# Patient Record
Sex: Male | Born: 1960
Health system: Southern US, Community
[De-identification: ages and names within clinical notes are randomized; demographics above are authoritative.]

## PROBLEM LIST (undated history)

## (undated) DIAGNOSIS — K219 Gastro-esophageal reflux disease without esophagitis: Secondary | ICD-10-CM

## (undated) DIAGNOSIS — M549 Dorsalgia, unspecified: Secondary | ICD-10-CM

## (undated) DIAGNOSIS — I2699 Other pulmonary embolism without acute cor pulmonale: Secondary | ICD-10-CM

## (undated) DIAGNOSIS — M1990 Unspecified osteoarthritis, unspecified site: Secondary | ICD-10-CM

## (undated) DIAGNOSIS — Z8739 Personal history of other diseases of the musculoskeletal system and connective tissue: Secondary | ICD-10-CM

## (undated) DIAGNOSIS — I10 Essential (primary) hypertension: Secondary | ICD-10-CM

## (undated) DIAGNOSIS — I82409 Acute embolism and thrombosis of unspecified deep veins of unspecified lower extremity: Secondary | ICD-10-CM

## (undated) DIAGNOSIS — G8929 Other chronic pain: Secondary | ICD-10-CM

## (undated) HISTORY — PX: BACK SURGERY: SHX140

## (undated) HISTORY — DX: Acute embolism and thrombosis of unspecified deep veins of unspecified lower extremity: I82.409

## (undated) HISTORY — PX: INGUINAL HERNIA REPAIR: SUR1180

## (undated) HISTORY — DX: Other pulmonary embolism without acute cor pulmonale: I26.99

## (undated) HISTORY — PX: COLONOSCOPY: SHX174

---

## 1999-02-05 HISTORY — PX: INGUINAL HERNIA REPAIR: SUR1180

## 2010-06-06 HISTORY — PX: CARPAL TUNNEL RELEASE: SHX101

## 2014-03-18 ENCOUNTER — Encounter (HOSPITAL_COMMUNITY): Payer: Self-pay | Admitting: Pharmacy Technician

## 2014-03-24 ENCOUNTER — Ambulatory Visit (HOSPITAL_COMMUNITY)
Admission: RE | Admit: 2014-03-24 | Discharge: 2014-03-24 | Disposition: A | Discharge status: Home / Self Care | Payer: BC Managed Care – PPO | Source: Ambulatory Visit | Attending: Orthopedic Surgery | Admitting: Orthopedic Surgery

## 2014-03-24 ENCOUNTER — Encounter (HOSPITAL_COMMUNITY)
Admission: RE | Admit: 2014-03-24 | Discharge: 2014-03-24 | Disposition: A | Discharge status: Home / Self Care | Payer: BC Managed Care – PPO | Source: Ambulatory Visit | Attending: Orthopedic Surgery | Admitting: Orthopedic Surgery

## 2014-03-24 ENCOUNTER — Other Ambulatory Visit (HOSPITAL_COMMUNITY): Payer: Self-pay

## 2014-03-24 ENCOUNTER — Encounter (HOSPITAL_COMMUNITY): Payer: Self-pay

## 2014-03-24 ENCOUNTER — Ambulatory Visit (HOSPITAL_COMMUNITY): Admission: RE | Admit: 2014-03-24 | Payer: BC Managed Care – PPO | Source: Ambulatory Visit

## 2014-03-24 DIAGNOSIS — M4806 Spinal stenosis, lumbar region: Secondary | ICD-10-CM | POA: Diagnosis present

## 2014-03-24 DIAGNOSIS — I10 Essential (primary) hypertension: Secondary | ICD-10-CM | POA: Diagnosis not present

## 2014-03-24 DIAGNOSIS — M4807 Spinal stenosis, lumbosacral region: Secondary | ICD-10-CM | POA: Diagnosis not present

## 2014-03-24 DIAGNOSIS — K219 Gastro-esophageal reflux disease without esophagitis: Secondary | ICD-10-CM | POA: Diagnosis not present

## 2014-03-24 DIAGNOSIS — Z01818 Encounter for other preprocedural examination: Secondary | ICD-10-CM | POA: Diagnosis present

## 2014-03-24 DIAGNOSIS — M5136 Other intervertebral disc degeneration, lumbar region: Secondary | ICD-10-CM | POA: Diagnosis present

## 2014-03-24 DIAGNOSIS — Z87891 Personal history of nicotine dependence: Secondary | ICD-10-CM | POA: Diagnosis not present

## 2014-03-24 HISTORY — DX: Gastro-esophageal reflux disease without esophagitis: K21.9

## 2014-03-24 HISTORY — DX: Essential (primary) hypertension: I10

## 2014-03-24 LAB — CBC
HEMATOCRIT: 47 % (ref 39.0–52.0)
HEMOGLOBIN: 16 g/dL (ref 13.0–17.0)
MCH: 32.9 pg (ref 26.0–34.0)
MCHC: 34 g/dL (ref 30.0–36.0)
MCV: 96.5 fL (ref 78.0–100.0)
Platelets: 182 10*3/uL (ref 150–400)
RBC: 4.87 MIL/uL (ref 4.22–5.81)
RDW: 13.4 % (ref 11.5–15.5)
WBC: 5.8 10*3/uL (ref 4.0–10.5)

## 2014-03-24 LAB — COMPREHENSIVE METABOLIC PANEL
ALBUMIN: 3.8 g/dL (ref 3.5–5.2)
ALK PHOS: 58 U/L (ref 39–117)
ALT: 122 U/L — AB (ref 0–53)
AST: 83 U/L — ABNORMAL HIGH (ref 0–37)
Anion gap: 15 (ref 5–15)
BUN: 17 mg/dL (ref 6–23)
CO2: 23 mEq/L (ref 19–32)
Calcium: 9.5 mg/dL (ref 8.4–10.5)
Chloride: 104 mEq/L (ref 96–112)
Creatinine, Ser: 1.22 mg/dL (ref 0.50–1.35)
GFR calc Af Amer: 76 mL/min — ABNORMAL LOW (ref >90)
GFR calc non Af Amer: 66 mL/min — ABNORMAL LOW (ref >90)
GLUCOSE: 107 mg/dL — AB (ref 70–99)
POTASSIUM: 3.7 meq/L (ref 3.7–5.3)
SODIUM: 142 meq/L (ref 137–147)
Total Bilirubin: 1 mg/dL (ref 0.3–1.2)
Total Protein: 7.1 g/dL (ref 6.0–8.3)

## 2014-03-24 LAB — URINALYSIS, ROUTINE W REFLEX MICROSCOPIC
Bilirubin Urine: NEGATIVE
GLUCOSE, UA: NEGATIVE mg/dL
HGB URINE DIPSTICK: NEGATIVE
Ketones, ur: NEGATIVE mg/dL
Leukocytes, UA: NEGATIVE
Nitrite: NEGATIVE
Protein, ur: NEGATIVE mg/dL
SPECIFIC GRAVITY, URINE: 1.017 (ref 1.005–1.030)
Urobilinogen, UA: 0.2 mg/dL (ref 0.0–1.0)
pH: 5.5 (ref 5.0–8.0)

## 2014-03-24 LAB — SURGICAL PCR SCREEN
MRSA, PCR: NEGATIVE
Staphylococcus aureus: POSITIVE — AB

## 2014-03-24 LAB — PROTIME-INR
INR: 1.15 (ref 0.00–1.49)
Prothrombin Time: 14.8 seconds (ref 11.6–15.2)

## 2014-03-24 LAB — APTT: aPTT: 28 seconds (ref 24–37)

## 2014-03-24 NOTE — H&P (Signed)
Nicholas [name] is an 53 y.o. male.   History of Present Illness The patient is a 53 year old male who comes in today for a preoperative history and physical. The patient is scheduled for a L3-S1 decomprression to be performed by Dr. Duane Lope D. Rolena Infante, MD at [facility] on 03-27-14 .   Allergies Penicillins  Social History ) Not under pain contract No history of drug/alcohol rehab Number of flights of stairs before winded 2-3 Tobacco use Former smoker. 02/19/2014: smoke(d) 1 1/2 pack(s) per day uses 1 1/2 can(s) smokeless per week Tobacco / smoke exposure 02/19/2014: yes Marital status married Current drinker 02/19/2014: Currently drinks beer and wine 15 or more times per week Children 0 Current work status working full time Living situation live with spouse Exercise Exercises rarely; does running / walking  Medication History  Omeprazole (20MG  Capsule DR, Oral) Active. Losartan Potassium (25MG  Tablet, Oral) Active. Colcrys (0.6MG  Tablet, Oral) Active. Probenecid (500MG  Tablet, Oral) Active. Naproxen Sodium (550MG  Tablet, Oral) Active. Medications Reconciled  Other Problems  Gastroesophageal Reflux Disease Gout High blood pressure  Vitals 03/24/2014 10:22 AM Weight: 223 lb Height: 71in Body Surface Area: 2.25 [name]: 31.1 kg/[name] Temp.: 97.85F(Oral)  BP: 148/90 (Sitting, Left Arm, Standard)     Review of Systems  Constitutional: Negative.   HENT: Negative.   Eyes: Negative.   Respiratory: Negative.   Cardiovascular: Negative.   Gastrointestinal: Negative.   Genitourinary: Negative.   Musculoskeletal: Positive for back pain.  Skin: Negative.   Neurological: Positive for tingling.  Endo/Heme/Allergies: Negative.   Psychiatric/Behavioral: Negative.     There were no vitals taken for this visit. Physical Exam  Constitutional: He is oriented to person, place, and time. He appears well-developed and well-nourished.   HENT:  Head: Normocephalic and atraumatic.  Eyes: EOM are normal. Pupils are equal, round, and reactive to light.  Neck: Normal range of motion.  Cardiovascular: Normal rate and regular rhythm.   Respiratory: Effort normal and breath sounds normal.  GI: Soft. Bowel sounds are normal.  Musculoskeletal: He exhibits tenderness.  Neurological: He is alert and oriented to person, place, and time.  Skin: Skin is warm and dry.  Psychiatric: He has a normal mood and affect.     Assessment/Plan  Spinal stenosis, lumbar 3-S1 Current Plans  Follow up in 2 weeks  Posterior L3-S1 Decompression/disectomy: Risks of surgery include infection, bleeding, nerve damage, death, stroke, paralysis, failure to heal, need for further surgery, ongoing or worse pain, need for further surgery, CSF leak, loss of bowel or bladder, and recurrent disc herniation or Stenosis which would necessitate need for further surgery.   Nicholas [name] 03/24/2014, 10:59 AM

## 2014-03-24 NOTE — Progress Notes (Signed)
This patient scored at an elevated risk for obstructive sleep apnea using the STOP BANG TOOL during a pre surgical testing 

## 2014-03-24 NOTE — Pre-Procedure Instructions (Signed)
Nicholas [name]  03/24/2014   Your procedure is scheduled on:  March 26, 2014  Report to [facility] Admitting at 6:30 AM.  Call this number if you have problems the morning of surgery: [phone]   Remember:   Do not eat food or drink liquids after midnight.   Take these medicines the morning of surgery with A SIP OF WATER: colchicine,  omeprazole (PRILOSEC), probenecid (BENEMID), eye drop, pain pill if needed   STOP NAPROXEN ONE WEEK PRIOR TO SURGERY    Do not wear jewelry.  Do not wear lotions, powders, or colognes.   Men may shave face and neck only.  Do not bring valuables to the hospital.  [facility] is not responsible for any belongings or valuables.               Contacts, dentures or bridgework may not be worn into surgery.  Leave suitcase in the car. After surgery it may be brought to your room.  For patients admitted to the hospital, discharge time is determined by your treatment team.               Patients discharged the day of surgery will not be allowed to drive home.  Name and phone number of your driver: family/friend     Please read over the following fact sheets that you were given: Pain Booklet, Coughing and Deep Breathing, Blood Transfusion Information and Surgical Site Infection Prevention

## 2014-03-25 ENCOUNTER — Encounter (HOSPITAL_COMMUNITY): Payer: Self-pay

## 2014-03-25 MED ORDER — CEFAZOLIN SODIUM-DEXTROSE 2-3 GM-% IV SOLR
2.0000 g | INTRAVENOUS | Status: AC
  Administered 2014-03-26: 2 g via INTRAVENOUS
  Filled 2014-03-25: qty 50

## 2014-03-25 MED ORDER — DEXAMETHASONE SODIUM PHOSPHATE 4 MG/ML IJ SOLN
4.0000 mg | INTRAMUSCULAR | Status: AC
  Administered 2014-03-26: 8 mg via INTRAVENOUS
  Filled 2014-03-25: qty 1

## 2014-03-25 MED ORDER — ACETAMINOPHEN 10 MG/ML IV SOLN: 1000.0000 mg | INTRAVENOUS | Status: DC

## 2014-03-25 NOTE — Progress Notes (Signed)
Anesthesia Chart Review:  Patient is a 53 year old male scheduled for L3-S1 decompression on 03/26/14 by Dr. Rolena Infante.  History includes HTN, GERD, gout, carpal tunnel release, hernia repair. BMI is 32.77 consistent with obesity. Epic documentation indicates: No PCP per patient, but is seen periodically by the [facility] in [facility], last visit 1-2 years ago. He reports that he quit smoking > 20 years ago.  I called and discussed his elevated LFTs from PAT. He admitted to drinking at least two beers/night. He says he was told that he had a fatty liver by ultrasound in the past and believes his liver function studies have been elevated in the past as well, possible medication related.  He denies previous IV drug use, tattoos, or known exposure to viral hepatitis.  Meds: Visine, colchicine, losartan, naproxen, omeprazole, probenecid, temazepam, tramadol.  EKG on 03/24/14 showed NSR.  CXR on 03/24/14 showed: Low volume exam with basilar atelectasis.  Preoperative labs noted.  Cr 1.22. Glucose 107.  AST 83 (NL 0 - 37), ALT 122 (NL 0 - 53). ALK PH and Total Bilirubin WNL.  CBC, PT/PTT, UA WNL.  Currently there are no comparison labs.   [facility]. So far, I've received:  Ultrasound of the liver and spleen from 04/11/13 that showed: The liver is enlarged measuring 18.3 cm. The liver is diffusely echogenic and somewhat heterogeneous consistent with fatty infiltration and/or chronic hepatocellular disease. There are numerous simple appearing cysts in clusters of cysts throughout the liver parenchyma. The largest is in the right lobe of the liver near the gallbladder fossa measuring 6 cm. The gallbladder appears unremarkable. The common bile duct measures 4 mm which is within normal limits. Appropriate flow is identified in the portal and hepatic veins. The spleen is within normal limits measuring 10.5 cm. 2 incidental granulomas are noted within the spleen. The spleen otherwise appears  unremarkable.  The ultrasound was followed by a letter from Dr. Colen Darling notifying the patient that the ultrasound findings in combination with his abnormal liver tests suggest that his alcohol consumption may be adversely affecting your liver and suggested that patient refrain or at least limit his ETOH intake.    I refaxed a request to the [facility] requesting comparison labs, but these are still pending. In the meantime, I reviewed above with anesthesiologist Dr. Glennon Mac.  Records indicate a chronic component to his liver disease, so with otherwise unremarkable labs would anticipate that he could proceed as planned from an anesthesia standpoint.  He should get continued out-patient follow-up.  Defer inpatient recommendations to Dr. Rolena Infante. I did notify Judeen Hammans at Dr. Rolena Infante office, and she will have him review labs today.   George Hugh [facility] Short Stay Center/Anesthesiology Phone [phone] 03/25/2014 3:34 PM

## 2014-03-26 ENCOUNTER — Observation Stay (HOSPITAL_COMMUNITY)
Admission: RE | Admit: 2014-03-26 | Discharge: 2014-03-27 | Disposition: A | Discharge status: Home / Self Care | Payer: BC Managed Care – PPO | Source: Ambulatory Visit | Attending: Orthopedic Surgery | Admitting: Orthopedic Surgery

## 2014-03-26 ENCOUNTER — Inpatient Hospital Stay (HOSPITAL_COMMUNITY): Payer: BC Managed Care – PPO

## 2014-03-26 ENCOUNTER — Encounter (HOSPITAL_COMMUNITY): Payer: BC Managed Care – PPO | Admitting: Vascular Surgery

## 2014-03-26 ENCOUNTER — Inpatient Hospital Stay (HOSPITAL_COMMUNITY): Payer: BC Managed Care – PPO | Admitting: Certified Registered Nurse Anesthetist

## 2014-03-26 ENCOUNTER — Encounter (HOSPITAL_COMMUNITY)
Admission: RE | Disposition: A | Discharge status: Home / Self Care | Payer: Self-pay | Source: Ambulatory Visit | Attending: Orthopedic Surgery

## 2014-03-26 ENCOUNTER — Encounter (HOSPITAL_COMMUNITY): Payer: Self-pay | Admitting: Certified Registered Nurse Anesthetist

## 2014-03-26 DIAGNOSIS — M4807 Spinal stenosis, lumbosacral region: Secondary | ICD-10-CM | POA: Insufficient documentation

## 2014-03-26 DIAGNOSIS — Z87891 Personal history of nicotine dependence: Secondary | ICD-10-CM | POA: Insufficient documentation

## 2014-03-26 DIAGNOSIS — M4806 Spinal stenosis, lumbar region: Secondary | ICD-10-CM | POA: Diagnosis not present

## 2014-03-26 DIAGNOSIS — K219 Gastro-esophageal reflux disease without esophagitis: Secondary | ICD-10-CM | POA: Insufficient documentation

## 2014-03-26 DIAGNOSIS — M48061 Spinal stenosis, lumbar region without neurogenic claudication: Secondary | ICD-10-CM | POA: Diagnosis present

## 2014-03-26 DIAGNOSIS — I10 Essential (primary) hypertension: Secondary | ICD-10-CM

## 2014-03-26 HISTORY — DX: Unspecified osteoarthritis, unspecified site: M19.90

## 2014-03-26 HISTORY — DX: Other chronic pain: G89.29

## 2014-03-26 HISTORY — DX: Personal history of other diseases of the musculoskeletal system and connective tissue: Z87.39

## 2014-03-26 HISTORY — PX: DECOMPRESSIVE LUMBAR LAMINECTOMY LEVEL 3: SHX5793

## 2014-03-26 HISTORY — PX: LUMBAR LAMINECTOMY/DECOMPRESSION MICRODISCECTOMY: SHX5026

## 2014-03-26 HISTORY — DX: Dorsalgia, unspecified: M54.9

## 2014-03-26 SURGERY — DECOMPRESSIVE LUMBAR LAMINECTOMY LEVEL 3
Anesthesia: General | Site: Back

## 2014-03-26 MED ORDER — EPHEDRINE SULFATE 50 MG/ML IJ SOLN
INTRAMUSCULAR | Status: AC
  Filled 2014-03-26: qty 1

## 2014-03-26 MED ORDER — LOSARTAN POTASSIUM 25 MG PO TABS
25.0000 mg | ORAL_TABLET | Freq: Every day | ORAL | Status: DC
  Administered 2014-03-27: 25 mg via ORAL
  Filled 2014-03-26: qty 1

## 2014-03-26 MED ORDER — HYDROMORPHONE HCL 1 MG/ML IJ SOLN
INTRAMUSCULAR | Status: AC
  Filled 2014-03-26: qty 1

## 2014-03-26 MED ORDER — CEFAZOLIN SODIUM 1-5 GM-% IV SOLN
1.0000 g | Freq: Three times a day (TID) | INTRAVENOUS | Status: AC
  Administered 2014-03-26 – 2014-03-27 (×2): 1 g via INTRAVENOUS
  Filled 2014-03-26 (×2): qty 50

## 2014-03-26 MED ORDER — ONDANSETRON HCL 4 MG/2ML IJ SOLN
INTRAMUSCULAR | Status: DC | PRN
  Administered 2014-03-26: 4 mg via INTRAVENOUS

## 2014-03-26 MED ORDER — DEXAMETHASONE 4 MG PO TABS
4.0000 mg | ORAL_TABLET | Freq: Four times a day (QID) | ORAL | Status: DC
  Administered 2014-03-27: 4 mg via ORAL
  Filled 2014-03-26 (×7): qty 1

## 2014-03-26 MED ORDER — DEXAMETHASONE SODIUM PHOSPHATE 4 MG/ML IJ SOLN
INTRAMUSCULAR | Status: AC
  Filled 2014-03-26: qty 1

## 2014-03-26 MED ORDER — COLCHICINE 0.6 MG PO TABS
0.6000 mg | ORAL_TABLET | Freq: Every day | ORAL | Status: DC
  Administered 2014-03-27: 0.6 mg via ORAL
  Filled 2014-03-26: qty 1

## 2014-03-26 MED ORDER — LIDOCAINE HCL (CARDIAC) 20 MG/ML IV SOLN
INTRAVENOUS | Status: AC
  Filled 2014-03-26: qty 5

## 2014-03-26 MED ORDER — ROCURONIUM BROMIDE 50 MG/5ML IV SOLN
INTRAVENOUS | Status: AC
  Filled 2014-03-26: qty 1

## 2014-03-26 MED ORDER — MORPHINE SULFATE 2 MG/ML IJ SOLN
1.0000 mg | INTRAMUSCULAR | Status: DC | PRN
  Administered 2014-03-26 (×3): 2 mg via INTRAVENOUS
  Administered 2014-03-27: 4 mg via INTRAVENOUS
  Filled 2014-03-26: qty 1
  Filled 2014-03-26: qty 2
  Filled 2014-03-26: qty 1

## 2014-03-26 MED ORDER — LACTATED RINGERS IV SOLN
INTRAVENOUS | Status: DC
  Administered 2014-03-26 – 2014-03-27 (×2): via INTRAVENOUS

## 2014-03-26 MED ORDER — PROPOFOL 10 MG/ML IV BOLUS
INTRAVENOUS | Status: AC
  Filled 2014-03-26: qty 20

## 2014-03-26 MED ORDER — ARTIFICIAL TEARS OP OINT
TOPICAL_OINTMENT | OPHTHALMIC | Status: AC
  Filled 2014-03-26: qty 3.5

## 2014-03-26 MED ORDER — PHENYLEPHRINE 40 MCG/ML (10ML) SYRINGE FOR IV PUSH (FOR BLOOD PRESSURE SUPPORT)
PREFILLED_SYRINGE | INTRAVENOUS | Status: AC
  Filled 2014-03-26: qty 10

## 2014-03-26 MED ORDER — MENTHOL 3 MG MT LOZG: 1.0000 | LOZENGE | OROMUCOSAL | Status: DC | PRN

## 2014-03-26 MED ORDER — HYDROMORPHONE HCL 1 MG/ML IJ SOLN
0.2500 mg | INTRAMUSCULAR | Status: DC | PRN
  Administered 2014-03-26 (×3): 0.5 mg via INTRAVENOUS

## 2014-03-26 MED ORDER — GLYCOPYRROLATE 0.2 MG/ML IJ SOLN
INTRAMUSCULAR | Status: DC | PRN
  Administered 2014-03-26: .4 mg via INTRAVENOUS
  Administered 2014-03-26: .1 mg via INTRAVENOUS

## 2014-03-26 MED ORDER — ONDANSETRON HCL 4 MG/2ML IJ SOLN: 4.0000 mg | INTRAMUSCULAR | Status: DC | PRN

## 2014-03-26 MED ORDER — 0.9 % SODIUM CHLORIDE (POUR BTL) OPTIME
TOPICAL | Status: DC | PRN
  Administered 2014-03-26: 1000 mL

## 2014-03-26 MED ORDER — DOCUSATE SODIUM 100 MG PO CAPS
100.0000 mg | ORAL_CAPSULE | Freq: Two times a day (BID) | ORAL | Status: DC
  Administered 2014-03-26 – 2014-03-27 (×2): 100 mg via ORAL
  Filled 2014-03-26 (×3): qty 1

## 2014-03-26 MED ORDER — HEMOSTATIC AGENTS (NO CHARGE) OPTIME
TOPICAL | Status: DC | PRN
  Administered 2014-03-26: 1 via TOPICAL

## 2014-03-26 MED ORDER — DEXTROSE 5 % IV SOLN
10.0000 mg | INTRAVENOUS | Status: DC | PRN
  Administered 2014-03-26: 15 ug/min via INTRAVENOUS

## 2014-03-26 MED ORDER — ONDANSETRON HCL 4 MG/2ML IJ SOLN
INTRAMUSCULAR | Status: AC
  Filled 2014-03-26: qty 2

## 2014-03-26 MED ORDER — DEXAMETHASONE SODIUM PHOSPHATE 4 MG/ML IJ SOLN
4.0000 mg | Freq: Four times a day (QID) | INTRAMUSCULAR | Status: DC
  Administered 2014-03-26 – 2014-03-27 (×3): 4 mg via INTRAVENOUS
  Filled 2014-03-26 (×7): qty 1

## 2014-03-26 MED ORDER — MAGNESIUM CITRATE PO SOLN: 1.0000 | Freq: Once | ORAL | Status: AC | PRN

## 2014-03-26 MED ORDER — MORPHINE SULFATE 2 MG/ML IJ SOLN
INTRAMUSCULAR | Status: AC
  Filled 2014-03-26: qty 1

## 2014-03-26 MED ORDER — BUPIVACAINE-EPINEPHRINE (PF) 0.25% -1:200000 IJ SOLN
INTRAMUSCULAR | Status: AC
  Filled 2014-03-26: qty 30

## 2014-03-26 MED ORDER — SODIUM CHLORIDE 0.9 % IJ SOLN
3.0000 mL | INTRAMUSCULAR | Status: DC | PRN
Start: 2014-03-26 — End: 2014-03-27

## 2014-03-26 MED ORDER — ALBUMIN HUMAN 5 % IV SOLN
INTRAVENOUS | Status: DC | PRN
  Administered 2014-03-26: 09:00:00 via INTRAVENOUS

## 2014-03-26 MED ORDER — NEOSTIGMINE METHYLSULFATE 10 MG/10ML IV SOLN
INTRAVENOUS | Status: DC | PRN
  Administered 2014-03-26: 3 mg via INTRAVENOUS

## 2014-03-26 MED ORDER — ROCURONIUM BROMIDE 100 MG/10ML IV SOLN
INTRAVENOUS | Status: DC | PRN
  Administered 2014-03-26: 50 mg via INTRAVENOUS
  Administered 2014-03-26: 5 mg via INTRAVENOUS
  Administered 2014-03-26: 10 mg via INTRAVENOUS

## 2014-03-26 MED ORDER — THROMBIN 20000 UNITS EX SOLR
CUTANEOUS | Status: AC
  Filled 2014-03-26: qty 20000

## 2014-03-26 MED ORDER — METHOCARBAMOL 500 MG PO TABS
ORAL_TABLET | ORAL | Status: AC
  Filled 2014-03-26: qty 1

## 2014-03-26 MED ORDER — FENTANYL CITRATE 0.05 MG/ML IJ SOLN
INTRAMUSCULAR | Status: AC
  Filled 2014-03-26: qty 5

## 2014-03-26 MED ORDER — OXYCODONE HCL 5 MG PO TABS
ORAL_TABLET | ORAL | Status: AC
  Filled 2014-03-26: qty 1

## 2014-03-26 MED ORDER — OXYCODONE HCL 5 MG/5ML PO SOLN: 5.0000 mg | Freq: Once | ORAL | Status: AC | PRN

## 2014-03-26 MED ORDER — MIDAZOLAM HCL 2 MG/2ML IJ SOLN
INTRAMUSCULAR | Status: AC
  Filled 2014-03-26: qty 2

## 2014-03-26 MED ORDER — LACTATED RINGERS IV SOLN
INTRAVENOUS | Status: DC | PRN
  Administered 2014-03-26 (×2): via INTRAVENOUS

## 2014-03-26 MED ORDER — MORPHINE SULFATE 2 MG/ML IJ SOLN
1.0000 mg | INTRAMUSCULAR | Status: DC | PRN
  Administered 2014-03-26: 2 mg via INTRAVENOUS

## 2014-03-26 MED ORDER — PHENOL 1.4 % MT LIQD: 1.0000 | OROMUCOSAL | Status: DC | PRN

## 2014-03-26 MED ORDER — FENTANYL CITRATE 0.05 MG/ML IJ SOLN
INTRAMUSCULAR | Status: DC | PRN
  Administered 2014-03-26: 25 ug via INTRAVENOUS
  Administered 2014-03-26: 150 ug via INTRAVENOUS
  Administered 2014-03-26: 50 ug via INTRAVENOUS
  Administered 2014-03-26: 25 ug via INTRAVENOUS

## 2014-03-26 MED ORDER — GELATIN ABSORBABLE MT POWD
OROMUCOSAL | Status: DC | PRN
  Administered 2014-03-26: 09:00:00 via TOPICAL

## 2014-03-26 MED ORDER — SODIUM CHLORIDE 0.9 % IV SOLN: 250.0000 mL | INTRAVENOUS | Status: DC

## 2014-03-26 MED ORDER — BUPIVACAINE-EPINEPHRINE 0.25% -1:200000 IJ SOLN
INTRAMUSCULAR | Status: DC | PRN
  Administered 2014-03-26: 10 mL

## 2014-03-26 MED ORDER — PHENYLEPHRINE HCL 10 MG/ML IJ SOLN
INTRAMUSCULAR | Status: DC | PRN
  Administered 2014-03-26 (×5): 80 ug via INTRAVENOUS

## 2014-03-26 MED ORDER — METHOCARBAMOL 1000 MG/10ML IJ SOLN
500.0000 mg | Freq: Four times a day (QID) | INTRAVENOUS | Status: DC | PRN
  Filled 2014-03-26: qty 5

## 2014-03-26 MED ORDER — PROMETHAZINE HCL 25 MG/ML IJ SOLN: 6.2500 mg | INTRAMUSCULAR | Status: DC | PRN

## 2014-03-26 MED ORDER — GLYCOPYRROLATE 0.2 MG/ML IJ SOLN
INTRAMUSCULAR | Status: AC
  Filled 2014-03-26: qty 1

## 2014-03-26 MED ORDER — SODIUM CHLORIDE 0.9 % IJ SOLN: 3.0000 mL | Freq: Two times a day (BID) | INTRAMUSCULAR | Status: DC

## 2014-03-26 MED ORDER — OXYCODONE HCL 5 MG PO TABS
20.0000 mg | ORAL_TABLET | ORAL | Status: DC | PRN
  Administered 2014-03-26 – 2014-03-27 (×4): 20 mg via ORAL
  Filled 2014-03-26 (×5): qty 4

## 2014-03-26 MED ORDER — OXYCODONE HCL 5 MG PO TABS
5.0000 mg | ORAL_TABLET | Freq: Once | ORAL | Status: AC | PRN
  Administered 2014-03-26: 5 mg via ORAL

## 2014-03-26 MED ORDER — SCOPOLAMINE 1 MG/3DAYS TD PT72
1.0000 | MEDICATED_PATCH | TRANSDERMAL | Status: DC
  Administered 2014-03-26: 1 via TRANSDERMAL
  Filled 2014-03-26: qty 1

## 2014-03-26 MED ORDER — PROPOFOL 10 MG/ML IV BOLUS
INTRAVENOUS | Status: DC | PRN
  Administered 2014-03-26: 150 mg via INTRAVENOUS

## 2014-03-26 MED ORDER — SUCCINYLCHOLINE CHLORIDE 20 MG/ML IJ SOLN
INTRAMUSCULAR | Status: AC
  Filled 2014-03-26: qty 1

## 2014-03-26 MED ORDER — MIDAZOLAM HCL 5 MG/5ML IJ SOLN
INTRAMUSCULAR | Status: DC | PRN
  Administered 2014-03-26: 2 mg via INTRAVENOUS

## 2014-03-26 MED ORDER — SODIUM CHLORIDE 0.9 % IJ SOLN
INTRAMUSCULAR | Status: AC
  Filled 2014-03-26: qty 10

## 2014-03-26 MED ORDER — ACETAMINOPHEN 10 MG/ML IV SOLN
INTRAVENOUS | Status: AC
  Filled 2014-03-26: qty 100

## 2014-03-26 MED ORDER — ARTIFICIAL TEARS OP OINT
TOPICAL_OINTMENT | OPHTHALMIC | Status: DC | PRN
  Administered 2014-03-26: 1 via OPHTHALMIC

## 2014-03-26 MED ORDER — METHOCARBAMOL 500 MG PO TABS
500.0000 mg | ORAL_TABLET | Freq: Four times a day (QID) | ORAL | Status: DC | PRN
  Administered 2014-03-26 – 2014-03-27 (×3): 500 mg via ORAL
  Filled 2014-03-26 (×3): qty 1

## 2014-03-26 SURGICAL SUPPLY — 62 items
ADH SKN CLS APL DERMABOND .7 (GAUZE/BANDAGES/DRESSINGS) ×1
APL SKNCLS STERI-STRIP NONHPOA (GAUZE/BANDAGES/DRESSINGS) ×1
BENZOIN TINCTURE PRP APPL 2/3 (GAUZE/BANDAGES/DRESSINGS) ×1 IMPLANT
BUR EGG ELITE 4.0 (BURR) ×1 IMPLANT
BUR MATCHSTICK NEURO 3.0 LAGG (BURR) ×2 IMPLANT
CANISTER SUCTION 2500CC (MISCELLANEOUS) ×2 IMPLANT
CLSR STERI-STRIP ANTIMIC 1/2X4 (GAUZE/BANDAGES/DRESSINGS) ×1 IMPLANT
CORDS BIPOLAR (ELECTRODE) ×2 IMPLANT
COVER SURGICAL LIGHT HANDLE (MISCELLANEOUS) ×2 IMPLANT
DERMABOND ADVANCED (GAUZE/BANDAGES/DRESSINGS) ×1
DERMABOND ADVANCED .7 DNX12 (GAUZE/BANDAGES/DRESSINGS) ×1 IMPLANT
DRAIN CHANNEL 15F RND FF W/TCR (WOUND CARE) ×2 IMPLANT
DRAPE POUCH INSTRU U-SHP 10X18 (DRAPES) ×2 IMPLANT
DRAPE SURG 17X23 STRL (DRAPES) ×2 IMPLANT
DRAPE U-SHAPE 47X51 STRL (DRAPES) ×2 IMPLANT
DRSG MEPILEX BORDER 4X8 (GAUZE/BANDAGES/DRESSINGS) ×2 IMPLANT
DURAPREP 26ML APPLICATOR (WOUND CARE) ×2 IMPLANT
ELECT BLADE 4.0 EZ CLEAN MEGAD (MISCELLANEOUS) ×2
ELECT CAUTERY BLADE 6.4 (BLADE) ×2 IMPLANT
ELECT PENCIL ROCKER SW 15FT (MISCELLANEOUS) ×2 IMPLANT
ELECT REM PT RETURN 9FT ADLT (ELECTROSURGICAL) ×2
ELECTRODE BLDE 4.0 EZ CLN MEGD (MISCELLANEOUS) IMPLANT
ELECTRODE REM PT RTRN 9FT ADLT (ELECTROSURGICAL) ×1 IMPLANT
EVACUATOR 1/8 PVC DRAIN (DRAIN) IMPLANT
EVACUATOR SILICONE 100CC (DRAIN) ×2 IMPLANT
GLOVE BIOGEL PI IND STRL 8 (GLOVE) ×1 IMPLANT
GLOVE BIOGEL PI IND STRL 8.5 (GLOVE) ×1 IMPLANT
GLOVE BIOGEL PI INDICATOR 8 (GLOVE) ×1
GLOVE BIOGEL PI INDICATOR 8.5 (GLOVE) ×1
GLOVE ECLIPSE 8.5 STRL (GLOVE) ×2 IMPLANT
GLOVE ORTHO TXT STRL SZ7.5 (GLOVE) ×2 IMPLANT
GOWN STRL REUS W/ TWL LRG LVL3 (GOWN DISPOSABLE) ×1 IMPLANT
GOWN STRL REUS W/TWL 2XL LVL3 (GOWN DISPOSABLE) ×4 IMPLANT
GOWN STRL REUS W/TWL LRG LVL3 (GOWN DISPOSABLE) ×2
KIT BASIN OR (CUSTOM PROCEDURE TRAY) ×2 IMPLANT
KIT ROOM TURNOVER OR (KITS) ×2 IMPLANT
NDL SPNL 18GX3.5 QUINCKE PK (NEEDLE) ×2 IMPLANT
NEEDLE 22X1 1/2 (OR ONLY) (NEEDLE) ×2 IMPLANT
NEEDLE SPNL 18GX3.5 QUINCKE PK (NEEDLE) ×4 IMPLANT
NS IRRIG 1000ML POUR BTL (IV SOLUTION) ×2 IMPLANT
PACK LAMINECTOMY ORTHO (CUSTOM PROCEDURE TRAY) ×2 IMPLANT
PACK UNIVERSAL I (CUSTOM PROCEDURE TRAY) ×2 IMPLANT
PAD ARMBOARD 7.5X6 YLW CONV (MISCELLANEOUS) ×4 IMPLANT
PATTIES SURGICAL .5 X.5 (GAUZE/BANDAGES/DRESSINGS) ×1 IMPLANT
PATTIES SURGICAL .5 X1 (DISPOSABLE) ×2 IMPLANT
SPONGE SURGIFOAM ABS GEL 100 (HEMOSTASIS) IMPLANT
STRIP CLOSURE SKIN 1/2X4 (GAUZE/BANDAGES/DRESSINGS) ×2 IMPLANT
SURGIFLO TRUKIT (HEMOSTASIS) IMPLANT
SUT BONE WAX W31G (SUTURE) ×2 IMPLANT
SUT MON AB 3-0 SH 27 (SUTURE) ×2
SUT MON AB 3-0 SH27 (SUTURE) ×1 IMPLANT
SUT VIC AB 0 CT1 27 (SUTURE) ×2
SUT VIC AB 0 CT1 27XBRD ANBCTR (SUTURE) ×1 IMPLANT
SUT VIC AB 1 CTX 36 (SUTURE) ×4
SUT VIC AB 1 CTX36XBRD ANBCTR (SUTURE) ×2 IMPLANT
SUT VIC AB 2-0 CT1 18 (SUTURE) ×2 IMPLANT
SYR BULB IRRIGATION 50ML (SYRINGE) ×2 IMPLANT
SYR CONTROL 10ML LL (SYRINGE) ×2 IMPLANT
TOWEL OR 17X24 6PK STRL BLUE (TOWEL DISPOSABLE) ×2 IMPLANT
TOWEL OR 17X26 10 PK STRL BLUE (TOWEL DISPOSABLE) ×2 IMPLANT
WATER STERILE IRR 1000ML POUR (IV SOLUTION) ×2 IMPLANT
YANKAUER SUCT BULB TIP NO VENT (SUCTIONS) ×2 IMPLANT

## 2014-03-26 NOTE — Anesthesia Procedure Notes (Signed)
Procedure Name: Intubation Date/Time: 03/26/2014 8:45 AM Performed by: RAVENEL, BRITNEY Pre-anesthesia Checklist: Patient identified, Timeout performed, Emergency Drugs available, Suction available and Patient being monitored Patient Re-evaluated:Patient Re-evaluated prior to inductionOxygen Delivery Method: Circle system utilized Preoxygenation: Pre-oxygenation with 100% oxygen Intubation Type: IV induction Ventilation: Mask ventilation without difficulty Laryngoscope Size: Mac and 4 Grade View: Grade III Tube type: Oral Number of attempts: 1 Airway Equipment and Method: Stylet and LTA kit utilized Placement Confirmation: ETT inserted through vocal cords under direct vision,  positive ETCO2,  CO2 detector and breath sounds checked- equal and bilateral Secured at: 21 cm Tube secured with: Tape Dental Injury: Teeth and Oropharynx as per pre-operative assessment      

## 2014-03-26 NOTE — Evaluation (Signed)
Occupational Therapy Evaluation Patient Details Name: Nicholas [name] MRN: [id number] DOB: 08/14/1960 Today's Date: 03/26/2014    History of Present Illness 53 y.o. s/p L3-S1 DECOMPRESSION.   Clinical Impression   Pt s/p above. Pt independent with ADLs, PTA. Feel pt will benefit from acute OT to increase independence prior to d/c.     Follow Up Recommendations  No OT follow up;Supervision - Intermittent    Equipment Recommendations  3 in 1 bedside comode;Other (comment) (AE)    Recommendations for Other Services       Precautions / Restrictions Precautions Precautions: Back Precaution Comments: Educated on precautions Required Braces or Orthoses: Spinal Brace Spinal Brace: Lumbar corset;Applied in sitting position Restrictions Weight Bearing Restrictions: No      Mobility Bed Mobility Overal bed mobility: Needs Assistance Bed Mobility: Rolling;Sidelying to Sit;Sit to Sidelying Rolling: Min guard Sidelying to sit: Min guard     Sit to sidelying: Min guard General bed mobility comments: cues for log roll technique.  Transfers Overall transfer level: Needs assistance Equipment used: Rolling walker (2 wheeled) Transfers: Sit to/from Stand Sit to Stand: Min guard         General transfer comment: cues for technique.     Balance                                            ADL Overall ADL's : Needs assistance/impaired                 Upper Body Dressing : Set up;Supervision/safety;Sitting   Lower Body Dressing: Minimal assistance;Sit to/from stand;With adaptive equipment   Toilet Transfer: Min guard;Ambulation;RW (bed)           Functional mobility during ADLs: Min guard;Rolling walker General ADL Comments: Educated on AE for LB ADLs and also explained could perform in supine possibly. Pt practiced with reacher and sockaid.  Educated on use of cup for teeth care and placement of grooming items to avoid breaking precautions.  Educated to have clothing under back brace and not to sleep in brace. Recommended sitting for LB bathing.      Vision                     Perception     Praxis      Pertinent Vitals/Pain Pain Assessment: 0-10 Pain Score: 6  Pain Location: back Pain Descriptors / Indicators: Shooting Pain Intervention(s): Repositioned;Monitored during session     Hand Dominance Right   Extremity/Trunk Assessment Upper Extremity Assessment Upper Extremity Assessment: Overall WFL for tasks assessed   Lower Extremity Assessment Lower Extremity Assessment: Defer to PT evaluation       Communication Communication Communication: No difficulties   Cognition Arousal/Alertness: Awake/alert Behavior During Therapy: WFL for tasks assessed/performed Overall Cognitive Status: Within Functional Limits for tasks assessed                     General Comments       Exercises       Shoulder Instructions      Home Living Family/patient expects to be discharged to:: Private residence Living Arrangements: Spouse/significant other Available Help at Discharge: Family (wife with him until Monday) Type of Home: House       Home Layout: Two level Alternate Level Stairs-Number of Steps: 20 Alternate Level Stairs-Rails:  (left first and then right) Bathroom Shower/Tub: Walk-in  shower;Tub/shower unit Shower/tub characteristics: Door (door on walk in shower) Bathroom Toilet: Standard (counter close by)                Prior Functioning/Environment Level of Independence: Independent             OT Diagnosis: Acute pain   OT Problem List: Decreased range of motion;Decreased knowledge of use of DME or AE;Decreased knowledge of precautions;Pain   OT Treatment/Interventions: Self-care/ADL training;DME and/or AE instruction;Therapeutic activities;Patient/family education;Balance training    OT Goals(Current goals can be found in the care plan section) Acute Rehab OT  Goals Patient Stated Goal: not stated OT Goal Formulation: With patient Time For Goal Achievement: 04/02/14 Potential to Achieve Goals: Good ADL Goals Pt Will Perform Lower Body Bathing: with modified independence;with adaptive equipment;sit to/from stand Pt Will Perform Lower Body Dressing: with modified independence;with adaptive equipment;sit to/from stand Pt Will Transfer to Toilet: with modified independence;ambulating (3 in 1 over commode) Pt Will Perform Toileting - Clothing Manipulation and hygiene: with modified independence;sit to/from stand;with adaptive equipment Pt Will Perform Tub/Shower Transfer: Tub transfer;Shower transfer;with supervision;ambulating;rolling walker;3 in 1 Additional ADL Goal #1: Pt will independently verbalize and demonstrate 3/3 back precautions.  OT Frequency: Min 2X/week   Barriers to D/C:            Co-evaluation              End of Session Equipment Utilized During Treatment: Gait belt;Rolling walker;Back brace  Activity Tolerance: Patient tolerated treatment well Patient left: in bed;with call bell/phone within reach;with family/visitor present   Time: 0034-9179 OT Time Calculation (min): 28 min Charges:  OT General Charges $OT Visit: 1 Procedure OT Evaluation $Initial OT Evaluation Tier I: 1 Procedure OT Treatments $Self Care/Home Management : 8-22 mins G-Codes: OT G-codes **NOT FOR INPATIENT CLASS** Functional Assessment Tool Used: clinical judgment Functional Limitation: Self care Self Care Current Status (X5056): At least 20 percent but less than 40 percent impaired, limited or restricted Self Care Goal Status (P7948): At least 1 percent but less than 20 percent impaired, limited or restricted  Benito Mccreedy OTR/L [phone] 03/26/2014, 5:55 PM

## 2014-03-26 NOTE — Brief Op Note (Signed)
03/26/2014  11:02 AM  PATIENT:  Art Buff  53 y.o. male  PRE-OPERATIVE DIAGNOSIS:  SPINAL STENOSIS  POST-OPERATIVE DIAGNOSIS:  SPINAL STENOSIS  PROCEDURE:  Procedure(s): L3-S1 DECOMPRESSION (N/A)  SURGEON:  Surgeon(s) and Role:    * Melina Schools, MD - Primary  PHYSICIAN ASSISTANT:   ASSISTANTS:  Benjiman Core   ANESTHESIA:   general  EBL:  Total I/O In: 1250 [I.V.:1000; IV Piggyback:250] Out: 80 [Blood:80]  BLOOD ADMINISTERED:none  DRAINS: none   LOCAL MEDICATIONS USED:  MARCAINE     SPECIMEN:  No Specimen  DISPOSITION OF SPECIMEN:  N/A  COUNTS:  YES  TOURNIQUET:  * No tourniquets in log *  DICTATION: .Other Dictation: Dictation Number [id number]  PLAN OF CARE: Admit for overnight observation  PATIENT DISPOSITION:  PACU - hemodynamically stable.

## 2014-03-26 NOTE — Plan of Care (Signed)
Problem: Consults Goal: Diagnosis - Spinal Surgery L3-S1 decompression

## 2014-03-26 NOTE — Transfer of Care (Signed)
Immediate Anesthesia Transfer of Care Note  Patient: Nicholas [name]  Procedure(s) Performed: Procedure(s): L3-S1 DECOMPRESSION (N/A)  Patient Location: PACU  Anesthesia Type:General  Level of Consciousness: awake, alert  and oriented  Airway & Oxygen Therapy: Patient Spontanous Breathing and Patient connected to nasal cannula oxygen  Post-op Assessment: Report given to PACU RN and Post -op Vital signs reviewed and stable  Post vital signs: Reviewed and stable  Complications: No apparent anesthesia complications

## 2014-03-26 NOTE — H&P (Signed)
   Plans Transcription  The patient has had physiotherapy and had injections several years ago, but at this point in time his overall loss in quality of life and pain is severe and he would like a more definitive solution. At this point, even though there is degenerative disc disease, his biggest problem is the neurogenic claudication. To address this, I think, by doing a 3-4, 4-5 central decompression and a right sided lateral recess decompression at 5-1 and foraminotomy would allow [place] to address the spinal stenosis and the nerve compression and will help to decrease his leg pain, hopefully improve his back pain and thereby improve his overall quality of life. We have reviewed the risks and benefits, we have talked about the goals of surgery and the long term goals. All of his questions were addressed and we will plan on his surgery in the near future.

## 2014-03-26 NOTE — Anesthesia Preprocedure Evaluation (Addendum)
Anesthesia Evaluation  Patient identified by MRN, date of birth, ID band Patient awake    Reviewed: Allergy & Precautions, H&P , NPO status , Patient's Chart, lab work & pertinent test results  Airway Mallampati: II TM Distance: >3 FB Neck ROM: Full    Dental  (+) Teeth Intact, Dental Advisory Given   Pulmonary former smoker,    Pulmonary exam normal       Cardiovascular hypertension, Pt. on medications     Neuro/Psych negative psych ROS   GI/Hepatic Neg liver ROS, GERD-  ,  Endo/Other    Renal/GU negative Renal ROS     Musculoskeletal   Abdominal   Peds  Hematology negative hematology ROS (+)   Anesthesia Other Findings   Reproductive/Obstetrics                          Anesthesia Physical Anesthesia Plan  ASA: II  Anesthesia Plan: General   Post-op Pain Management:    Induction: Intravenous  Airway Management Planned: Oral ETT  Additional Equipment:   Intra-op Plan:   Post-operative Plan: Extubation in OR  Informed Consent: I have reviewed the patients History and Physical, chart, labs and discussed the procedure including the risks, benefits and alternatives for the proposed anesthesia with the patient or authorized representative who has indicated his/her understanding and acceptance.   Dental advisory given  Plan Discussed with: CRNA and Anesthesiologist  Anesthesia Plan Comments:        Anesthesia Quick Evaluation

## 2014-03-26 NOTE — Anesthesia Postprocedure Evaluation (Signed)
Anesthesia Post Note  Patient: Nicholas [name]  Procedure(s) Performed: Procedure(s) (LRB): L3-S1 DECOMPRESSION (N/A)  Anesthesia type: general  Patient location: PACU  Post pain: Pain level controlled  Post assessment: Patient's Cardiovascular Status Stable  Last Vitals:  Filed Vitals:   03/26/14 1200  BP:   Pulse: 82  Temp:   Resp: 17    Post vital signs: Reviewed and stable  Level of consciousness: sedated  Complications: No apparent anesthesia complications

## 2014-03-26 NOTE — Op Note (Signed)
NAMEKELIJAH, TOWRY                  ACCOUNT NO.:  [id number]  MEDICAL RECORD NO.:  [id number]  LOCATION:  [facility]                         FACILITY:  [facility]  PHYSICIAN:  Dahlia Bailiff, MD    DATE OF BIRTH:  07/08/1960  DATE OF PROCEDURE: DATE OF DISCHARGE:                              OPERATIVE REPORT   PREOPERATIVE DIAGNOSIS:  Lumbar spinal stenosis, L3-4, L4-5, and L5-S1.  POSTOPERATIVE DIAGNOSIS:  Lumbar spinal stenosis, L3-4, L4-5, and L5-S1.  OPERATIVE PROCEDURE:  Lumbar decompression, L3-S1, with Gill decompression, right side L4-5.  COMPLICATIONS:  None.  CONDITION:  Stable.  FIRST ASSISTANT:  Alyson Locket. Velora Heckler.  HISTORY:  This is a very pleasant gentleman who has been having severe back, buttock, and bilateral leg pain.  At rest, in a seated forward flexed position, he is comfortable.  With any amount of activity or ambulation, his pain immediately intensifies.  As a result, the failure to improve with conservative measures, he elected to proceed with surgery.  All appropriate risks, benefits, and alternatives were discussed with the patient and consent was obtained.  OPERATIVE NOTE:  The patient was brought to the operating room, placed supine on the operating table.  After successful induction of general anesthesia and endotracheal intubation, TEDs, SCDs were applied.  He was turned prone onto the Wilson frame and all bony prominences were well- padded.  The back was prepped and draped in the standard fashion.  Time- out was taken to confirm patient, procedure, and all other pertinent important data.  Needles were placed into the back and an x-ray was taken to confirm incision site.  The proposed incision site was infiltrated with 0.25% Marcaine with epinephrine.  Midline incision was made starting at the midportion of the L3 spinous process and continued to the inferior aspect of the L5 spinous process.  Sharp dissection was carried out down to the deep fascia.   Deep fascia was sharply incised. Using a Cobb elevator and Bovie, I stripped the paraspinal muscles to expose the inferior third of the spinous process of L3 with a complete L4 and L5 spinous processes.  I then continued out laterally exposing the lamina of L4 and L5 and the facet capsule.  Care was taken not to violate the facet capsule itself.  Once I had bilateral exposure, self- retaining retractors were placed.  I then placed markers and took an x- ray to confirm my level.  At this point, I removed the entire L4 and L5 spinous processes and the inferior third of the L3 spinous process.  I then developed a plane underneath the L5 lamina and using a 3 mm Kerrison punch, I started performing a laminectomy.  Once I had an adequate visualization, I then used Penfield 4 to dissect into the ligamentum flavum and visualized the thecal sac.  On his preoperative MRI, the patient had only right-sided lateral recess stenosis, so I did not do an extensive left-sided decompression at L5.  Once I had the central laminectomy completed, I then continued to the lateral gutter on the right side.  There was significant ligamentum flavum buckling and osteophyte formation from the medial aspect of the superior S1  facet complex.  This was debrided using a 2 and 3 mm Kerrison punch.  I then identified the S1 nerve root and performed a slight foraminotomy using my 3 mm Kerrison.  I then could easily take my [facility] and follow the S1 nerve root around the S1 pedicle towards the foramen.  I then continued superiorly decompressing the lateral recess.  I could now palpate the L5 and visualize the L5 pedicle.  I then performed a generous foraminotomy of that level using my 2 and 3 mm Kerrison punch. At this point, I then went back to my central laminectomy of L5, developed a plane underneath the ligamentum flavum at L4-5 and resected this with my 3 mm Kerrison rongeur and there was significant  central stenosis at this level due to infolding of the ligamentum flavum as well as osteophyte formation.  Once I had complete laminectomy centrally of L4, there was significant restoration of volume of the central canal.  I then proceeded into the right lateral gutters.  This was his more symptomatic side.  The complete L4-5 facet complex was arthritic and nonfunctioning.  After decompressing the lateral recess, I noted on performing my foraminotomy, I elected to complete the pars resection to have an adequate foraminotomy of L4.  This allowed me to remove the entire inferior L4 facet and completely decompress the lateral recess. Once I had the facet removed, I can then resect the medial portion of the superior 5 facet and relieved the tension on the L5 traversing nerve root in the lateral recess.  I then continued and completed my pars resection and identified the L4 nerve root.  With the pars resected, the foramen was completely decompressed.  I then continued superiorly resecting the bone using my 2 and 3 mm Kerrison punches until I could palpate the medial border of the L4 pedicle.  Once the L4 laminectomy was completed, I then proceeded superiorly.  I took the inferior third of the lamina of L3 performing a laminotomy at this point.  I then continued into the lateral recess until I had an adequate decompression. I then switched sides and on the left side, I resected the thickened ligamentum flavum that was causing a central stenosis at L3-4 and L4-5. There was no need to extensively decompress L5-S1 on the left side because preoperatively he had no significant symptoms on this side, and his MRI did not show severe stenosis on that side.  Once I had my decompression complete, I then placed a Woodson and a Penfield 4 into the wound and took an x-ray to confirm I had adequately spanned from the inferior aspect of the L3 pedicle to the S1 foramen.  Once I had done this and confirmed  that I had addressed all the pathology, I then took my [facility], palpated along the superior aspect and inferior aspect of my decompression to ensure that I had an adequate decompression and then the left and right lateral recesses and foramens were all palpated confirming I had a satisfactory decompression.  At this point, with the decompression complete, I irrigated the wound copiously with normal saline.  Hemostasis was obtained using FloSeal as well as bipolar electrocautery.  Once I had hemostasis, I then placed a thrombin-soaked Gelfoam patty over the exposed thecal sac and then closed the deep fascia with interrupted #1 Vicryl sutures.  I then closed with a layer of 2-0 interrupted Vicryl sutures and a 3-0 Monocryl for the skin.  Steri-Strips and dry dressing were applied.  The patient was extubated and transferred to PACU without incident.  At the end of the case, all needle and sponge counts were correct.  There was no adverse intraoperative events.  No evidence of any CSF leak.  My first assistant, Benjiman Core was instrumental in assisting with retraction, visualization, wound closure, and positioning.     Dahlia Bailiff, MD     DDB/MEDQ  D:  03/26/2014  T:  03/26/2014  Job:  [id number]

## 2014-03-27 DIAGNOSIS — M4806 Spinal stenosis, lumbar region: Secondary | ICD-10-CM | POA: Diagnosis not present

## 2014-03-27 MED ORDER — DOCUSATE SODIUM 100 MG PO CAPS
100.0000 mg | ORAL_CAPSULE | Freq: Two times a day (BID) | ORAL | Status: DC
Start: 2014-03-27 — End: 2014-05-08

## 2014-03-27 MED ORDER — POLYETHYLENE GLYCOL 3350 17 G PO PACK: 17.0000 g | PACK | Freq: Every day | ORAL | Status: DC

## 2014-03-27 MED ORDER — OXYCODONE HCL 15 MG PO TABS: 15.0000 mg | ORAL_TABLET | Freq: Four times a day (QID) | ORAL | Status: DC | PRN

## 2014-03-27 MED ORDER — OXYCODONE-ACETAMINOPHEN 10-325 MG PO TABS: 1.0000 | ORAL_TABLET | Freq: Four times a day (QID) | ORAL | Status: DC | PRN

## 2014-03-27 MED ORDER — ONDANSETRON 4 MG PO TBDP: 4.0000 mg | ORAL_TABLET | Freq: Three times a day (TID) | ORAL | Status: DC | PRN

## 2014-03-27 MED ORDER — METHOCARBAMOL 500 MG PO TABS: 500.0000 mg | ORAL_TABLET | Freq: Four times a day (QID) | ORAL | Status: DC | PRN

## 2014-03-27 NOTE — Evaluation (Signed)
Physical Therapy Evaluation Patient Details Name: Nicholas [name] MRN: [id number] DOB: [birth year] Today's Date: 03/27/2014   History of Present Illness  53 y.o. s/p L3-S1 DECOMPRESSION.  Clinical Impression  Patient evaluated by Physical Therapy with no further acute PT needs identified. All education regarding back precautions has been completed and the patient has no further questions.  See below for any follow-up Physial Therapy or equipment needs for mobility. PT is signing off. Thank you for this referral.     Follow Up Recommendations No PT follow up    Equipment Recommendations  Rolling walker with 5" wheels    Recommendations for Other Services       Precautions / Restrictions Precautions Precautions: Back Precaution Booklet Issued: Yes (comment) Precaution Comments: Educated on precautions Required Braces or Orthoses: Spinal Brace Spinal Brace: Lumbar corset;Applied in sitting position Restrictions Weight Bearing Restrictions: No      Mobility  Bed Mobility Overal bed mobility: Needs Assistance Bed Mobility: Rolling;Sidelying to Sit;Sit to Sidelying Rolling: Supervision Sidelying to sit: Supervision     Sit to sidelying: Supervision General bed mobility comments: no rail and HOB flat  Transfers Overall transfer level: Needs assistance Equipment used: Rolling walker (2 wheeled) Transfers: Sit to/from Stand Sit to Stand: Supervision         General transfer comment: VCs for safe hand placement  Ambulation/Gait Ambulation/Gait assistance: Supervision Ambulation Distance (Feet): 140 Feet Assistive device: Rolling walker (2 wheeled) Gait Pattern/deviations: Step-through pattern;Decreased stride length Gait velocity: guarded due to pain Gait velocity interpretation: Below normal speed for age/gender General Gait Details: cues for RW safety and to adhere to back precautions   Stairs Stairs: Yes Stairs assistance: Supervision Stair Management: Two  rails;Step to pattern;Alternating pattern;Forwards Number of Stairs: 3 General stair comments: cues for safety and sequencing; no LOB noted  Wheelchair Mobility    Modified Rankin (Stroke Patients Only)       Balance Overall balance assessment: No apparent balance deficits (not formally assessed);Needs assistance Sitting-balance support: Feet supported Sitting balance-Leahy Scale: Good     Standing balance support: During functional activity Standing balance-Leahy Scale: Fair Standing balance comment: pt standing at sink without RW or UE support for short period of time                              Pertinent Vitals/Pain Pain Assessment: 0-10 Pain Score: 8  Pain Location: surgical site Pain Descriptors / Indicators: Shooting;Tender Pain Intervention(s): Repositioned;Premedicated before session;Monitored during session    [place] expects to be discharged to:: Private residence Living Arrangements: Spouse/significant other Available Help at Discharge: Family Type of Home: House Home Access: Level entry     Home Layout: Two level Home Equipment: None      Prior Function Level of Independence: Independent               Hand Dominance   Dominant Hand: Right    Extremity/Trunk Assessment   Upper Extremity Assessment: Defer to OT evaluation           Lower Extremity Assessment: Overall WFL for tasks assessed      Cervical / Trunk Assessment: Normal  Communication   Communication: No difficulties  Cognition Arousal/Alertness: Awake/alert Behavior During Therapy: WFL for tasks assessed/performed Overall Cognitive Status: Within Functional Limits for tasks assessed       Memory: Decreased recall of precautions              General  Comments General comments (skin integrity, edema, etc.): encouraged pt to ambulate unit 2-3x daily; reviewed car transfer technique; reviewed brace precautions and instructions     Exercises        Assessment/Plan    PT Assessment Patent does not need any further PT services  PT Diagnosis Acute pain;Difficulty walking   PT Problem List    PT Treatment Interventions     PT Goals (Current goals can be found in the Care Plan section) Acute Rehab PT Goals Patient Stated Goal: to go home  PT Goal Formulation: All assessment and education complete, DC therapy    Frequency     Barriers to discharge        Co-evaluation               End of Session Equipment Utilized During Treatment: Gait belt;Back brace Activity Tolerance: Patient tolerated treatment well Patient left: in bed;with call bell/phone within reach;with family/visitor present Nurse Communication: Mobility status    Functional Assessment Tool Used: clincial judgement Functional Limitation: Mobility: Walking and moving around Mobility: Walking and Moving Around Current Status (Y5035): At least 1 percent but less than 20 percent impaired, limited or restricted Mobility: Walking and Moving Around Goal Status [phone]): At least 1 percent but less than 20 percent impaired, limited or restricted Mobility: Walking and Moving Around Discharge Status [phone]): At least 1 percent but less than 20 percent impaired, limited or restricted    Time: 0948-1010 PT Time Calculation (min): 22 min   Charges:   PT Evaluation $Initial PT Evaluation Tier I: 1 Procedure PT Treatments $Gait Training: 8-22 mins   PT G Codes:   Functional Assessment Tool Used: clincial judgement Functional Limitation: Mobility: Walking and moving around    [place], [place], [place]  [phone] 03/27/2014, 1:04 PM

## 2014-03-27 NOTE — Progress Notes (Addendum)
Subjective: Doing well.  Pain controlled.  Good ambulation with PT.  No voiding issues.    Objective: Vital signs in last 24 hours: Temp:  [98.3 F (36.8 C)-99.5 F (37.5 C)] 99.5 F (37.5 C) (10/22 0514) Pulse Rate:  [79-98] 90 (10/22 0514) Resp:  [10-14] 14 (10/22 0514) BP: (104-145)/(69-95) 134/84 mmHg (10/22 0514) SpO2:  [94 %-97 %] 97 % (10/22 0514)  Intake/Output from previous day: 10/21 0701 - 10/22 0700 In: 2825 [P.O.:240; I.V.:2335; IV Piggyback:250] Out: 580 [Urine:500; Blood:80] Intake/Output this shift: Total I/O In: 240 [P.O.:240] Out: -    Recent Labs  03/24/14 1424  HGB 16.0    Recent Labs  03/24/14 1424  WBC 5.8  RBC 4.87  HCT 47.0  PLT 182    Recent Labs  03/24/14 1424  NA 142  K 3.7  CL 104  CO2 23  BUN 17  CREATININE 1.22  GLUCOSE 107*  CALCIUM 9.5    Recent Labs  03/24/14 1424  INR 1.15    Exam:  NVI intact.  bilat calves nontender.    Assessment/Plan: D/c home today if continues to do well.  .  Scripts on chart.     OWENS,JAMES M 03/27/2014, 12:48 PM     Patient doing well Ambulating up/down hall Pain controlled with oral meds Ok for D/C - neuropathic right leg pain resolved

## 2014-03-27 NOTE — Progress Notes (Signed)
Occupational Therapy Treatment and Discharge Patient Details Name: Nicholas [name] MRN: [id number] DOB: [birth year] Today's Date: 03/27/2014    History of present illness 53 y.o. s/p L3-S1 DECOMPRESSION.   OT comments  This 53 yo seen today for education on AE use, toilet transfer, and tub transfer (pt is not going to get a seat, feels that by the time he can get in the shower he will be able to stand to complete task). All education completed and all questions answered about BADLs, acute OT will sign off.  Follow Up Recommendations  No OT follow up;Supervision - Intermittent    Equipment Recommendations   (AE-pt's wife to get)       Precautions / Restrictions Precautions Precautions: Back Precaution Booklet Issued: Yes (comment) Precaution Comments: Educated on precautions Required Braces or Orthoses: Spinal Brace Spinal Brace: Lumbar corset;Applied in sitting position Restrictions Weight Bearing Restrictions: No       Mobility Bed Mobility Overal bed mobility: Needs Assistance Bed Mobility: Rolling;Sidelying to Sit;Sit to Sidelying Rolling: Supervision Sidelying to sit: Supervision     Sit to sidelying: Supervision General bed mobility comments: no rail and HOB flat  Transfers Overall transfer level: Needs assistance Equipment used: Rolling walker (2 wheeled) Transfers: Sit to/from Stand Sit to Stand: Supervision         General transfer comment: VCs for safe hand placement        ADL Overall ADL's : Needs assistance/impaired                     Lower Body Dressing: Set up;Supervision/safety;With adaptive equipment;Adhering to back precautions;Sit to/from stand   Toilet Transfer: Supervision/safety;Ambulation;RW;Regular Toilet (legs apart and external rotation of feet)   Toileting- Clothing Manipulation and Hygiene:  (Pt is going to use a toilet aid with wet wipes at home)     Tub/Shower Transfer Details (indicate cue type and reason):  Demonstrated to them how pt would side step over tub side   General ADL Comments: Can don brace by himself                Cognition   Behavior During Therapy: WFL for tasks assessed/performed Overall Cognitive Status: Within Functional Limits for tasks assessed                                    Pertinent Vitals/ Pain       Pain Assessment: 0-10 Pain Score: 4  Pain Location: back Pain Descriptors / Indicators: Aching Pain Intervention(s): Monitored during session;Repositioned         Frequency Min 2X/week     Progress Toward Goals  OT Goals(current goals can now be found in the care plan section)  Progress towards OT goals:  (All education completed)     Plan Discharge plan remains appropriate       End of Session Equipment Utilized During Treatment: Rolling walker;Back brace   Activity Tolerance Patient tolerated treatment well   Patient Left in bed;with call bell/phone within reach   Nurse Communication          Time: 2671-2458 OT Time Calculation (min): 39 min  Charges: OT General Charges $OT Visit: 1 Procedure OT Treatments $Self Care/Home Management : 38-52 mins  Almon Register [phone] 03/27/2014, 1:03 PM

## 2014-03-27 NOTE — Progress Notes (Signed)
Discharge instructions given to patient including prescription. Patient verbalizes understanding. Awaiting for walker at this time.

## 2014-03-27 NOTE — Progress Notes (Signed)
UR completed 

## 2014-03-27 NOTE — Care Management Note (Signed)
CARE MANAGEMENT NOTE 03/27/2014  Patient:  Nicholas [name], Nicholas [name]   Account Number:  [id number]  Date Initiated:  03/27/2014  Documentation initiated by:  Ricki Miller  Subjective/Objective Assessment:   53 yr old male s/p L3-S1 decompression.     Action/Plan:   Patient has no home health therapy needs.   Anticipated DC Date:  03/27/2014   Anticipated DC Plan:  [place]  CM consult      PAC Choice  DURABLE MEDICAL EQUIPMENT   Choice offered to / List presented to:     DME arranged  Vassie Moselle      DME agency  [employer] arranged  NA      Status of service:  Completed, signed off Medicare Important Message given?   (If response is "NO", the following Medicare IM given date fields will be blank) Date Medicare IM given:   Medicare IM given by:   Date Additional Medicare IM given:   Additional Medicare IM given by:    Discharge Disposition:  HOME/SELF CARE  Per UR Regulation:  Reviewed for med. necessity/level of care/duration of stay  If discussed at [place] of Stay Meetings, dates discussed:    Comments:

## 2014-03-27 NOTE — Progress Notes (Signed)
Patient is requesting his home med Prilosec 20 mg by mouth daily.

## 2014-03-28 ENCOUNTER — Encounter (HOSPITAL_COMMUNITY): Payer: Self-pay | Admitting: Orthopedic Surgery

## 2014-04-17 NOTE — Discharge Summary (Signed)
Patient ID: Nicholas [name] MRN: [id number] DOB/AGE: Apr 09, 1961 53 y.o.  Admit date: 03/26/2014 Discharge date: 04/17/2014  Admission Diagnoses:  Active Problems:   Lumbar stenosis   Discharge Diagnoses:  Active Problems:   Lumbar stenosis  status post Procedure(s): L3-S1 DECOMPRESSION  Past Medical History  Diagnosis Date  . Hypertension   . GERD (gastroesophageal reflux disease)   . Arthritis     "my whole body"  . Chronic back pain     "mainly lower; some in top too" (03/26/2014)  . History of gout     Surgeries: Procedure(s): L3-S1 DECOMPRESSION on 03/26/2014   Consultants:    Discharged Condition: Improved  Hospital Course: Nicholas [name] is an 53 y.o. male who was admitted 03/26/2014 for operative treatment of lumbar stenosis  Patient failed conservative treatments (please see the history and physical for the specifics) and had severe unremitting pain that affects sleep, daily activities and work/hobbies. After pre-op clearance, the patient was taken to the operating room on 03/26/2014 and underwent  Procedure(s): L3-S1 DECOMPRESSION.    Patient was given perioperative antibiotics:  Anti-infectives    Start     Dose/Rate Route Frequency Ordered Stop   03/26/14 1700  ceFAZolin (ANCEF) IVPB 1 g/50 mL premix     1 g100 mL/hr over 30 Minutes Intravenous Every 8 hours 03/26/14 1607 03/27/14 0104   03/25/14 1452  ceFAZolin (ANCEF) IVPB 2 g/50 mL premix     2 g100 mL/hr over 30 Minutes Intravenous 30 min pre-op 03/25/14 1452 03/26/14 0845       Patient was given sequential compression devices and early ambulation to prevent DVT.   Patient benefited maximally from hospital stay and there were no complications. At the time of discharge, the patient was urinating/moving their bowels without difficulty, tolerating a regular diet, pain is controlled with oral pain medications and they have been cleared by PT/OT.   Recent vital signs: No data found.    Recent  laboratory studies: No results for input(s): WBC, HGB, HCT, PLT, NA, K, CL, CO2, BUN, CREATININE, GLUCOSE, INR, CALCIUM in the last 72 hours.  Invalid input(s): PT, 2   Discharge Medications:     Medication List    STOP taking these medications        naproxen 375 MG tablet  Commonly known as:  NAPROSYN     temazepam 15 MG capsule  Commonly known as:  RESTORIL     traMADol 50 MG tablet  Commonly known as:  ULTRAM      TAKE these medications        colchicine 0.6 MG tablet  Take 0.6 mg by mouth daily.     docusate sodium 100 MG capsule  Commonly known as:  COLACE  Take 1 capsule (100 mg total) by mouth 2 (two) times daily.     losartan 25 MG tablet  Commonly known as:  COZAAR  Take 25 mg by mouth daily.     methocarbamol 500 MG tablet  Commonly known as:  ROBAXIN  Take 1 tablet (500 mg total) by mouth every 6 (six) hours as needed for muscle spasms.     omeprazole 20 MG capsule  Commonly known as:  PRILOSEC  Take 20 mg by mouth daily.     ondansetron 4 MG disintegrating tablet  Commonly known as:  ZOFRAN ODT  Take 1 tablet (4 mg total) by mouth every 8 (eight) hours as needed.     oxyCODONE 15 MG immediate release tablet  Commonly known as:  ROXICODONE  Take 1 tablet (15 mg total) by mouth every 6 (six) hours as needed for pain.     polyethylene glycol packet  Commonly known as:  MIRALAX / GLYCOLAX  Take 17 g by mouth daily.     probenecid 500 MG tablet  Commonly known as:  BENEMID  Take 500 mg by mouth 2 (two) times daily.     VISINE OP  Place 1 drop into both eyes 3 (three) times daily.        Diagnostic Studies: Dg Chest 2 View  03/24/2014   CLINICAL DATA:  Hypertension, preoperative evaluation for lumbar spine surgery.  EXAM: CHEST - 2 VIEW  COMPARISON:  None.  FINDINGS: Slightly low lung volumes with basilar vascular crowding and atelectasis. Normal heart size and vascularity. Slight elevation of the right hemidiaphragm anteriorly. Lungs otherwise  clear. No pneumonia, CHF, collapse or consolidation. No effusion or pneumothorax. Trachea is midline. Degenerative changes of the spine. AC joint degenerative change bilaterally.  IMPRESSION: Low volume exam with basilar atelectasis.   Electronically Signed   By: Daryll Brod M.D.   On: 03/24/2014 16:14   Dg Lumbar Spine 2-3 Views  03/26/2014   CLINICAL DATA:  L3 through S1 lumbar decompression.  EXAM: LUMBAR SPINE - 2-3 VIEW  COMPARISON:  None.  FINDINGS: On the film labeled #1 there are surgical probes posterior to thel L2-3 disc space an the pedicle of L5. On the film labeled #3 there is a tissue spreaders posterior to the L4-5 disc space. A surgical probe is posterior to the L3-4 disc space and at S1-S2.  IMPRESSION: Portable intraoperative radiographs obtained for intraoperative probe localization.   Electronically Signed   By: Kerby Moors M.D.   On: 03/26/2014 11:20   Dg Spine Portable 1 View  03/26/2014   CLINICAL DATA:  Localization image labeled #2  EXAM: PORTABLE SPINE - 1 VIEW  COMPARISON:  Cross-table portable intraoperative localization film labeled #1.  FINDINGS: There are assumed to be 5 lumbar type non-rib-bearing vertebral bodies. The uppermost metallic marker overlies the spinous process of L4. The lowermost marker lies approximately 1.7 cm posterior to the L5-S1 disc space.  IMPRESSION: The intraoperative localization devices are in position as described.   Electronically Signed   By: David  Martinique   On: 03/26/2014 09:30          Follow-up Information    Schedule an appointment as soon as possible for a visit with Dahlia Bailiff, MD.   Specialty:  Orthopedic Surgery   Why:  needs return office visit 2 weeks postop   Contact information:   [address] [place] [address] [phone]       Discharge Plan:  discharge to home  Disposition:     Signed: Melina Schools D for Dr. Melina Schools [facility] [phone] 04/17/2014,  5:09 PM

## 2014-05-06 ENCOUNTER — Ambulatory Visit (HOSPITAL_COMMUNITY)
Admission: RE | Admit: 2014-05-06 | Discharge: 2014-05-06 | Disposition: A | Discharge status: Home / Self Care | Payer: BC Managed Care – PPO | Source: Ambulatory Visit | Attending: Orthopedic Surgery | Admitting: Orthopedic Surgery

## 2014-05-06 ENCOUNTER — Encounter (INDEPENDENT_AMBULATORY_CARE_PROVIDER_SITE_OTHER): Payer: Self-pay

## 2014-05-06 ENCOUNTER — Encounter (HOSPITAL_COMMUNITY): Payer: Self-pay | Admitting: Emergency Medicine

## 2014-05-06 ENCOUNTER — Other Ambulatory Visit (HOSPITAL_COMMUNITY): Payer: Self-pay | Admitting: Orthopedic Surgery

## 2014-05-06 ENCOUNTER — Other Ambulatory Visit: Payer: Self-pay

## 2014-05-06 ENCOUNTER — Inpatient Hospital Stay (HOSPITAL_COMMUNITY)
Admission: EM | Admit: 2014-05-06 | Discharge: 2014-05-08 | DRG: 299 | Disposition: A | Discharge status: Home / Self Care | Payer: BC Managed Care – PPO | Attending: Internal Medicine | Admitting: Internal Medicine

## 2014-05-06 DIAGNOSIS — M7989 Other specified soft tissue disorders: Secondary | ICD-10-CM

## 2014-05-06 DIAGNOSIS — I10 Essential (primary) hypertension: Secondary | ICD-10-CM | POA: Diagnosis present

## 2014-05-06 DIAGNOSIS — M48061 Spinal stenosis, lumbar region without neurogenic claudication: Secondary | ICD-10-CM | POA: Diagnosis present

## 2014-05-06 DIAGNOSIS — G8929 Other chronic pain: Secondary | ICD-10-CM | POA: Diagnosis present

## 2014-05-06 DIAGNOSIS — M549 Dorsalgia, unspecified: Secondary | ICD-10-CM | POA: Diagnosis present

## 2014-05-06 DIAGNOSIS — I82431 Acute embolism and thrombosis of right popliteal vein: Secondary | ICD-10-CM | POA: Diagnosis present

## 2014-05-06 DIAGNOSIS — M10071 Idiopathic gout, right ankle and foot: Secondary | ICD-10-CM | POA: Diagnosis present

## 2014-05-06 DIAGNOSIS — R197 Diarrhea, unspecified: Secondary | ICD-10-CM | POA: Diagnosis present

## 2014-05-06 DIAGNOSIS — R071 Chest pain on breathing: Secondary | ICD-10-CM

## 2014-05-06 DIAGNOSIS — M4806 Spinal stenosis, lumbar region: Secondary | ICD-10-CM

## 2014-05-06 DIAGNOSIS — Z9889 Other specified postprocedural states: Secondary | ICD-10-CM

## 2014-05-06 DIAGNOSIS — R7989 Other specified abnormal findings of blood chemistry: Secondary | ICD-10-CM | POA: Diagnosis present

## 2014-05-06 DIAGNOSIS — I2699 Other pulmonary embolism without acute cor pulmonale: Secondary | ICD-10-CM | POA: Diagnosis present

## 2014-05-06 DIAGNOSIS — F17220 Nicotine dependence, chewing tobacco, uncomplicated: Secondary | ICD-10-CM | POA: Diagnosis present

## 2014-05-06 DIAGNOSIS — M1990 Unspecified osteoarthritis, unspecified site: Secondary | ICD-10-CM | POA: Diagnosis present

## 2014-05-06 DIAGNOSIS — I82449 Acute embolism and thrombosis of unspecified tibial vein: Secondary | ICD-10-CM | POA: Diagnosis present

## 2014-05-06 DIAGNOSIS — K219 Gastro-esophageal reflux disease without esophagitis: Secondary | ICD-10-CM | POA: Diagnosis present

## 2014-05-06 DIAGNOSIS — M109 Gout, unspecified: Secondary | ICD-10-CM | POA: Diagnosis present

## 2014-05-06 DIAGNOSIS — M25562 Pain in left knee: Secondary | ICD-10-CM

## 2014-05-06 DIAGNOSIS — I82403 Acute embolism and thrombosis of unspecified deep veins of lower extremity, bilateral: Secondary | ICD-10-CM

## 2014-05-06 DIAGNOSIS — F1010 Alcohol abuse, uncomplicated: Secondary | ICD-10-CM | POA: Diagnosis present

## 2014-05-06 LAB — CBC WITH DIFFERENTIAL/PLATELET
Basophils Absolute: 0 10*3/uL (ref 0.0–0.1)
Basophils Relative: 0 % (ref 0–1)
Eosinophils Absolute: 0.4 10*3/uL (ref 0.0–0.7)
Eosinophils Relative: 9 % — ABNORMAL HIGH (ref 0–5)
HCT: 42.3 % (ref 39.0–52.0)
Hemoglobin: 14.5 g/dL (ref 13.0–17.0)
LYMPHS PCT: 26 % (ref 12–46)
Lymphs Abs: 1.2 10*3/uL (ref 0.7–4.0)
MCH: 31.9 pg (ref 26.0–34.0)
MCHC: 34.3 g/dL (ref 30.0–36.0)
MCV: 93.2 fL (ref 78.0–100.0)
Monocytes Absolute: 0.3 10*3/uL (ref 0.1–1.0)
Monocytes Relative: 6 % (ref 3–12)
NEUTROS ABS: 2.5 10*3/uL (ref 1.7–7.7)
Neutrophils Relative %: 59 % (ref 43–77)
PLATELETS: 156 10*3/uL (ref 150–400)
RBC: 4.54 MIL/uL (ref 4.22–5.81)
RDW: 12.5 % (ref 11.5–15.5)
WBC: 4.4 10*3/uL (ref 4.0–10.5)

## 2014-05-06 LAB — COMPREHENSIVE METABOLIC PANEL
ALT: 90 U/L — AB (ref 0–53)
AST: 70 U/L — AB (ref 0–37)
Albumin: 3.7 g/dL (ref 3.5–5.2)
Alkaline Phosphatase: 52 U/L (ref 39–117)
Anion gap: 14 (ref 5–15)
BILIRUBIN TOTAL: 0.7 mg/dL (ref 0.3–1.2)
BUN: 10 mg/dL (ref 6–23)
CHLORIDE: 102 meq/L (ref 96–112)
CO2: 25 meq/L (ref 19–32)
Calcium: 9.5 mg/dL (ref 8.4–10.5)
Creatinine, Ser: 1.23 mg/dL (ref 0.50–1.35)
GFR calc Af Amer: 75 mL/min — ABNORMAL LOW (ref >90)
GFR, EST NON AFRICAN AMERICAN: 65 mL/min — AB (ref >90)
Glucose, Bld: 90 mg/dL (ref 70–99)
POTASSIUM: 4.5 meq/L (ref 3.7–5.3)
SODIUM: 141 meq/L (ref 137–147)
Total Protein: 6.5 g/dL (ref 6.0–8.3)

## 2014-05-06 LAB — APTT: APTT: 28 s (ref 24–37)

## 2014-05-06 LAB — PROTIME-INR
INR: 1.09 (ref 0.00–1.49)
Prothrombin Time: 14.3 seconds (ref 11.6–15.2)

## 2014-05-06 MED ORDER — PANTOPRAZOLE SODIUM 40 MG PO TBEC
40.0000 mg | DELAYED_RELEASE_TABLET | Freq: Every day | ORAL | Status: DC
  Administered 2014-05-07 – 2014-05-08 (×2): 40 mg via ORAL
  Filled 2014-05-06 (×2): qty 1

## 2014-05-06 MED ORDER — SODIUM CHLORIDE 0.9 % IJ SOLN
3.0000 mL | Freq: Two times a day (BID) | INTRAMUSCULAR | Status: DC
  Administered 2014-05-07 – 2014-05-08 (×2): 3 mL via INTRAVENOUS

## 2014-05-06 MED ORDER — IOHEXOL 350 MG/ML SOLN
100.0000 mL | Freq: Once | INTRAVENOUS | Status: AC | PRN
  Administered 2014-05-06: 100 mL via INTRAVENOUS

## 2014-05-06 MED ORDER — HYDROMORPHONE HCL 1 MG/ML IJ SOLN
0.5000 mg | INTRAMUSCULAR | Status: DC | PRN
  Administered 2014-05-06 – 2014-05-07 (×2): 0.5 mg via INTRAVENOUS
  Filled 2014-05-06 (×2): qty 1

## 2014-05-06 MED ORDER — HEPARIN (PORCINE) IN NACL 100-0.45 UNIT/ML-% IJ SOLN
1400.0000 [IU]/h | INTRAMUSCULAR | Status: AC
  Administered 2014-05-06 – 2014-05-07 (×2): 1400 [IU]/h via INTRAVENOUS
  Filled 2014-05-06 (×3): qty 250

## 2014-05-06 MED ORDER — LOSARTAN POTASSIUM 25 MG PO TABS
25.0000 mg | ORAL_TABLET | Freq: Every day | ORAL | Status: DC
  Administered 2014-05-07 – 2014-05-08 (×2): 25 mg via ORAL
  Filled 2014-05-06 (×3): qty 1

## 2014-05-06 MED ORDER — HEPARIN BOLUS VIA INFUSION
5000.0000 [IU] | Freq: Once | INTRAVENOUS | Status: AC
  Administered 2014-05-06: 5000 [IU] via INTRAVENOUS
  Filled 2014-05-06: qty 5000

## 2014-05-06 MED ORDER — OXYCODONE HCL 5 MG PO TABS
15.0000 mg | ORAL_TABLET | ORAL | Status: DC | PRN
  Administered 2014-05-07 – 2014-05-08 (×5): 15 mg via ORAL
  Filled 2014-05-06 (×5): qty 3

## 2014-05-06 MED ORDER — METHOCARBAMOL 500 MG PO TABS: 500.0000 mg | ORAL_TABLET | Freq: Four times a day (QID) | ORAL | Status: DC | PRN

## 2014-05-06 MED ORDER — COLCHICINE 0.6 MG PO TABS
0.6000 mg | ORAL_TABLET | Freq: Every day | ORAL | Status: DC
  Administered 2014-05-07 – 2014-05-08 (×2): 0.6 mg via ORAL
  Filled 2014-05-06 (×2): qty 1

## 2014-05-06 MED ORDER — PROBENECID 500 MG PO TABS
500.0000 mg | ORAL_TABLET | Freq: Every day | ORAL | Status: DC
  Administered 2014-05-07 – 2014-05-08 (×2): 500 mg via ORAL
  Filled 2014-05-06 (×3): qty 1

## 2014-05-06 MED ORDER — ONDANSETRON HCL 4 MG PO TABS: 4.0000 mg | ORAL_TABLET | Freq: Four times a day (QID) | ORAL | Status: DC | PRN

## 2014-05-06 MED ORDER — DIAZEPAM 5 MG PO TABS: 10.0000 mg | ORAL_TABLET | Freq: Every evening | ORAL | Status: DC | PRN

## 2014-05-06 MED ORDER — HYDROMORPHONE HCL 1 MG/ML IJ SOLN
1.0000 mg | Freq: Once | INTRAMUSCULAR | Status: AC
  Administered 2014-05-06: 1 mg via INTRAVENOUS
  Filled 2014-05-06: qty 1

## 2014-05-06 MED ORDER — HYDROMORPHONE HCL 1 MG/ML IJ SOLN
0.5000 mg | Freq: Once | INTRAMUSCULAR | Status: AC
Start: 2014-05-06 — End: 2014-05-06
  Administered 2014-05-06: 0.5 mg via INTRAVENOUS
  Filled 2014-05-06: qty 1

## 2014-05-06 MED ORDER — ONDANSETRON HCL 4 MG/2ML IJ SOLN: 4.0000 mg | Freq: Four times a day (QID) | INTRAMUSCULAR | Status: DC | PRN

## 2014-05-06 NOTE — ED Provider Notes (Addendum)
CSN: [id number]     Arrival date & time 05/06/14  1713 History   First MD Initiated Contact with Patient 05/06/14 1730     Chief Complaint  Patient presents with  . CT scan PE & DVT     (Consider location/radiation/quality/duration/timing/severity/associated sxs/prior Treatment) HPI Comments: Patient had back surgery approximately 6 weeks ago and was having leg pain and shortness of breath for the last 2 weeks. He had Dopplers today and a CT of his chest which showed bilateral DVTs and multiple bilateral PEs. Patient denies any prior history of PE or clotting disorder. he denies any chest pain or palpitations.  The history is provided by the patient.    Past Medical History  Diagnosis Date  . Hypertension   . GERD (gastroesophageal reflux disease)   . Arthritis     "my whole body"  . Chronic back pain     "mainly lower; some in top too" (03/26/2014)  . History of gout    Past Surgical History  Procedure Laterality Date  . Carpal tunnel release Bilateral 2012  . Lumbar laminectomy/decompression microdiscectomy  03/26/2014    L3-S1  . Inguinal hernia repair Right 2000's  . Back surgery    . Decompressive lumbar laminectomy level 3 N/A 03/26/2014    Procedure: L3-S1 DECOMPRESSION;  Surgeon: Melina Schools, MD;  Location: [place];  Service: Orthopedics;  Laterality: N/A;   No family history on file. History  Substance Use Topics  . Smoking status: Former Smoker -- 1.00 packs/day for 12 years    Types: Cigarettes  . Smokeless tobacco: Former Systems developer    Types: Snuff, Chew     Comment: "quit smoking cigarettes & smokeless tobacco ~ 2000"  . Alcohol Use: 10.8 oz/week    18 Cans of beer per week     Comment: 03/26/2014 "2-3, 12oz beers/day"    Review of Systems  All other systems reviewed and are negative.     Allergies  Review of patient's allergies indicates no known allergies.  Home Medications   Prior to Admission medications   Medication Sig Start Date End Date  Taking? Authorizing Provider  colchicine 0.6 MG tablet Take 0.6 mg by mouth daily.   Yes Historical Provider, MD  diazepam (VALIUM) 10 MG tablet Take 1 tablet by mouth at bedtime as needed. For sleep 04/08/14  Yes Historical Provider, MD  losartan (COZAAR) 25 MG tablet Take 25 mg by mouth daily.   Yes Historical Provider, MD  methocarbamol (ROBAXIN) 500 MG tablet Take 1 tablet (500 mg total) by mouth every 6 (six) hours as needed for muscle spasms. 03/27/14  Yes Benjiman Core, PA-C  omeprazole (PRILOSEC) 20 MG capsule Take 20 mg by mouth daily.   Yes Historical Provider, MD  ondansetron (ZOFRAN ODT) 4 MG disintegrating tablet Take 1 tablet (4 mg total) by mouth every 8 (eight) hours as needed. Patient taking differently: Take 4 mg by mouth every 8 (eight) hours as needed for nausea or vomiting.  03/27/14  Yes Benjiman Core, PA-C  oxyCODONE (ROXICODONE) 15 MG immediate release tablet Take 1 tablet (15 mg total) by mouth every 6 (six) hours as needed for pain. 03/27/14  Yes Benjiman Core, PA-C  probenecid (BENEMID) 500 MG tablet Take 500 mg by mouth daily.    Yes Historical Provider, MD  Tetrahydrozoline HCl (VISINE OP) Place 1 drop into both eyes 3 (three) times daily.   Yes Historical Provider, MD  docusate sodium (COLACE) 100 MG capsule Take 1 capsule (100 mg total)  by mouth 2 (two) times daily. Patient not taking: Reported on 05/06/2014 03/27/14   Benjiman Core, PA-C  polyethylene glycol [facility] / Floria Raveling) packet Take 17 g by mouth daily. Patient not taking: Reported on 05/06/2014 03/27/14   Benjiman Core, PA-C   BP 145/90 mmHg  Pulse 76  Temp(Src) 97.9 F (36.6 C) (Temporal)  Resp 17  SpO2 98% Physical Exam  Constitutional: He is oriented to person, place, and time. He appears well-developed and well-nourished. No distress.  HENT:  Head: Normocephalic and atraumatic.  Mouth/Throat: Oropharynx is clear and moist.  Eyes: Conjunctivae and EOM are normal. Pupils are equal, round, and reactive to  light.  Neck: Normal range of motion. Neck supple.  Cardiovascular: Normal rate, regular rhythm and intact distal pulses.   No murmur heard. Pulmonary/Chest: Effort normal and breath sounds normal. No respiratory distress. He has no wheezes. He has no rales.  Abdominal: Soft. He exhibits no distension. There is no tenderness. There is no rebound and no guarding.  Musculoskeletal: Normal range of motion. He exhibits tenderness. He exhibits no edema.  Bilateral calf tenderness with mild nonpitting edema.  Neurological: He is alert and oriented to person, place, and time.  Skin: Skin is warm and dry. No rash noted. No erythema.  Psychiatric: He has a normal mood and affect. His behavior is normal.  Nursing note and vitals reviewed.   ED Course  Procedures (including critical care time) Labs Review Labs Reviewed  CBC WITH DIFFERENTIAL - Abnormal; Notable for the following:    Eosinophils Relative 9 (*)    All other components within normal limits  COMPREHENSIVE METABOLIC PANEL - Abnormal; Notable for the following:    AST 70 (*)    ALT 90 (*)    GFR calc non Af Amer 65 (*)    GFR calc Af Amer 75 (*)    All other components within normal limits  PROTIME-INR    Imaging Review Ct Angio Chest Pe W/cm &/or Wo Cm  05/06/2014   CLINICAL DATA:  Shortness of breath. Deep venous thrombosis. Chest pain.  EXAM: CT ANGIOGRAPHY CHEST WITH CONTRAST  TECHNIQUE: Multidetector CT imaging of the chest was performed using the standard protocol during bolus administration of intravenous contrast. Multiplanar CT image reconstructions and MIPs were obtained to evaluate the vascular anatomy.  CONTRAST:  168mL OMNIPAQUE IOHEXOL 350 MG/ML SOLN  COMPARISON:  Chest x-ray dated 03/24/2014  FINDINGS: The patient has multiple bilateral pulmonary emboli including to the right upper and lower lobes. No emboli in the right middle lobe. There are emboli to the left upper lobe.  RV/LV ratio is 0.87, at the upper limits of  normal.  There are no infiltrates or effusions. Overall heart size is normal. No acute osseous abnormality. Flowing osteophytes fuse much of the thoracic spine.  The visualized portion of the upper abdomen demonstrates numerous benign-appearing hepatic cysts.  Review of the MIP images confirms the above findings.  IMPRESSION: Multiple bilateral pulmonary emboli. Dr. Rolena Infante was informed of the findings and the patient was sent to the [facility] emergency room.   Electronically Signed   By: Rozetta Nunnery M.D.   On: 05/06/2014 17:10     Date: 05/06/2014  Rate: 68  Rhythm: normal sinus rhythm  QRS Axis: normal  Intervals: normal  ST/T Wave abnormalities: normal  Conduction Disutrbances: none  Narrative Interpretation: unremarkable      MDM   Final diagnoses:  Pulmonary embolism  DVT (deep venous thrombosis), bilateral    Patient  presented from Dr. Rolena Infante office after having a CT of the chest today that showed bilateral multiple PEs and bilateral DVTs after back surgery 6 weeks ago. Patient is only complaining of some mild shortness of breath but denies chest pain or palpitations. EKG is within normal limits. Patient is in no acute distress otherwise. However given recent surgery start patient on heparin and admit for transition to oral anticoagulation.  7:28 PM Spoke with Dr. Rolena Infante who will follow the pt in hospital.  Also is ok with pt getting anticoagulation will just follow his neuro exam closely.  Blanchie Dessert, MD 05/06/14 1929  Blanchie Dessert, MD 05/06/14 Mila Doce, MD 05/06/14 3552

## 2014-05-06 NOTE — ED Notes (Signed)
Pt was at follow up apt today for post up surgery when nurse at PCP office noticed pt was Nicholas [name]. Pt was sent to out pt CT. Scan showed DVT in right leg and two PEs.

## 2014-05-06 NOTE — Progress Notes (Signed)
ANTICOAGULATION CONSULT NOTE - Initial Consult  Pharmacy Consult for IV Heparin Indication: pulmonary embolus  No Known Allergies  Patient Measurements:   Heparin Dosing Weight: 82 kg  Vital Signs: Temp: 97.9 F (36.6 C) (12/01 1723) Temp Source: Temporal (12/01 1723) BP: 141/96 mmHg (12/01 1915) Pulse Rate: 67 (12/01 1915)  Labs:  Recent Labs  05/06/14 1823  HGB 14.5  HCT 42.3  PLT 156  LABPROT 14.3  INR 1.09  CREATININE 1.23    CrCl cannot be calculated (Unknown ideal weight.).   Medical History: Past Medical History  Diagnosis Date  . Hypertension   . GERD (gastroesophageal reflux disease)   . Arthritis     "my whole body"  . Chronic back pain     "mainly lower; some in top too" (03/26/2014)  . History of gout     Medications:  Scheduled:   Infusions:  . heparin     Followed by  . heparin      Assessment: 57 yoM s/p back surgery 6 weeks ago now with SOB x 2 weeks found to have bilateral DVTs and multiple bilateral PEs on CT.  Goal of Therapy:  Heparin level 0.3-0.7 units/ml Monitor platelets by anticoagulation protocol: Yes   Plan:   Add aptt now to labs drawn  Heparin 5000 unit bolus x1  Start drip @ 1400 units/hr  Daily CBC/HL  Check 1st HL in 6 hours  Dorrene German 05/06/2014,7:41 PM

## 2014-05-06 NOTE — Progress Notes (Signed)
VASCULAR LAB PRELIMINARY  PRELIMINARY  PRELIMINARY  PRELIMINARY  Bilateral lower extremity venous duplex  completed.    Preliminary report:  Right:  DVT noted in the popliteal vein, PTV, and peroneal vein.  No evidence of superficial thrombosis.  No Baker's cyst.  Left:  No evidence of DVT, superficial thrombosis, or Baker's cyst.  Xara Paulding, RVT 05/06/2014, 4:23 PM

## 2014-05-06 NOTE — Progress Notes (Signed)
  CARE MANAGEMENT ED NOTE 05/06/2014  Patient:  Nicholas [name], Nicholas [name]   Account Number:  [id number]  Date Initiated:  05/06/2014  Documentation initiated by:  Livia Snellen  Subjective/Objective Assessment:   Patient presents to ED with shortness of breath, swelling and burning of right leg     Subjective/Objective Assessment Detail:   CTA of chest Multiple bilateral pulmonary emboli. Dr. Rolena Infante was informed of the  findings and the patient was sent to the [facility] emergency room.  Doppler of bilaterl lower extrmity: Preliminary report:  Right:  DVT noted in the popliteal vein, PTV, and peroneal vein.  No evidence of superficial thrombosis.  No Baker's cyst.  Left:  No evidence of DVT, superficial thrombosis, or Baker's cyst.  Patient admitted to [facility] on 10/21 and discharged the next day following back surgery.     Action/Plan:   Action/Plan Detail:   Anticipated DC Date:       Status Recommendation to Physician:   Result of Recommendation:    Other ED [place]  Other  PCP issues    Choice offered to / List presented to:            Status of service:  Completed, signed off  ED Comments:   ED Comments Detail:  EDCM spoke to patient at bedside.  Patient confirms he has [employer] without pcp.  EDCM provided patient with list of pcps who accept [employer]BS insurnace within a ten mile radius of patient's zip code.  Patient thankful for resources.  No further EDCM needs at this time.

## 2014-05-06 NOTE — ED Notes (Signed)
Bed: VI15 Expected date:  Expected time:  Means of arrival:  Comments: Hold for CT pt +PE

## 2014-05-06 NOTE — H&P (Signed)
[facility] History and Physical  Patient: Nicholas [name]  TIR:[id number]  DOB: 12/25/1960  DOS: the patient was seen and examined on 05/06/2014 PCP: No PCP Per Patient  Chief Complaint: abnormal imaging  HPI: Nicholas [name] is a 53 y.o. male with Past medical history of hypertension, GERD, lumbar decompression surgery. The patient is presenting with an abnormal imaging. Patient recently underwent a L3 S1 decompression surgery in November 15. He went for 6 weeks postoperative follow-up today where he was found to have swelling of his right leg with burning sensation on top of the leg and underwent imaging of the lower extremity Doppler which was positive for DVT and he was sent here for further workup. Patient mentions that since last 3 months he has been having pain on the right back as well as high and leg which led him to undergo lumbar decompression surgery and after the surgery he has noted that he has been having some shortness of breath. He also noted that he has burning sensation on top of the foot as well as leg and pain on the sides of the leg since last few days. Exline he has history of gout and takes medications chronically and has noted since last 2 weeks swelling and pain of the right toe. He denies any injury fall trauma. He denies any dizziness lightheadedness fever or chills chest pain. His mother had a blood clot which was diagnosed a few years after she was diagnosed with colon cancer. There is no recent change in his medication. He denies any prior history of blood clots.  The patient is coming from home. And at his baseline independent for most of his ADL.  Review of Systems: as mentioned in the history of present illness.  A Comprehensive review of the other systems is negative.  Past Medical History  Diagnosis Date  . Hypertension   . GERD (gastroesophageal reflux disease)   . Arthritis     "my whole body"  . Chronic back pain     "mainly lower; some in top  too" (03/26/2014)  . History of gout    Past Surgical History  Procedure Laterality Date  . Carpal tunnel release Bilateral 2012  . Lumbar laminectomy/decompression microdiscectomy  03/26/2014    L3-S1  . Inguinal hernia repair Right 2000's  . Back surgery    . Decompressive lumbar laminectomy level 3 N/A 03/26/2014    Procedure: L3-S1 DECOMPRESSION;  Surgeon: Melina Schools, MD;  Location: [place];  Service: Orthopedics;  Laterality: N/A;   Social History:  reports that he has quit smoking. His smoking use included Cigarettes. He has a 12 pack-year smoking history. He has quit using smokeless tobacco. His smokeless tobacco use included Snuff and Chew. He reports that he drinks about 10.8 oz of alcohol per week. He reports that he does not use illicit drugs.  No Known Allergies  No family history on file.  Prior to Admission medications   Medication Sig Start Date End Date Taking? Authorizing Provider  colchicine 0.6 MG tablet Take 0.6 mg by mouth daily.   Yes Historical Provider, MD  diazepam (VALIUM) 10 MG tablet Take 1 tablet by mouth at bedtime as needed. For sleep 04/08/14  Yes Historical Provider, MD  losartan (COZAAR) 25 MG tablet Take 25 mg by mouth daily.   Yes Historical Provider, MD  methocarbamol (ROBAXIN) 500 MG tablet Take 1 tablet (500 mg total) by mouth every 6 (six) hours as needed for muscle spasms. 03/27/14  Yes  Benjiman Core, PA-C  omeprazole (PRILOSEC) 20 MG capsule Take 20 mg by mouth daily.   Yes Historical Provider, MD  ondansetron (ZOFRAN ODT) 4 MG disintegrating tablet Take 1 tablet (4 mg total) by mouth every 8 (eight) hours as needed. Patient taking differently: Take 4 mg by mouth every 8 (eight) hours as needed for nausea or vomiting.  03/27/14  Yes Benjiman Core, PA-C  oxyCODONE (ROXICODONE) 15 MG immediate release tablet Take 1 tablet (15 mg total) by mouth every 6 (six) hours as needed for pain. 03/27/14  Yes Benjiman Core, PA-C  probenecid (BENEMID) 500 MG tablet  Take 500 mg by mouth daily.    Yes Historical Provider, MD  Tetrahydrozoline HCl (VISINE OP) Place 1 drop into both eyes 3 (three) times daily.   Yes Historical Provider, MD  docusate sodium (COLACE) 100 MG capsule Take 1 capsule (100 mg total) by mouth 2 (two) times daily. Patient not taking: Reported on 05/06/2014 03/27/14   Benjiman Core, PA-C  polyethylene glycol [facility] / Floria Raveling) packet Take 17 g by mouth daily. Patient not taking: Reported on 05/06/2014 03/27/14   Benjiman Core, PA-C    Physical Exam: Filed Vitals:   05/06/14 1945 05/06/14 2000 05/06/14 2015 05/06/14 2030  BP: 139/93 138/94 136/99 156/94  Pulse: 64 71 70 86  Temp:      TempSrc:      Resp: 10 14 14 18   SpO2: 99% 99% 98% 97%    General: Alert, Awake and Oriented to Time, Place and Person. Appear in mild distress Eyes: PERRL ENT: Oral Mucosa clear moist. Neck: no JVD Cardiovascular: S1 and S2 Present, no Murmur, Peripheral Pulses Present Respiratory: Bilateral Air entry equal and Decreased, Clear to Auscultation, noCrackles, no wheezes Abdomen: Bowel Sound present, Soft and non tender Skin: no Rash, incision appears well-healed Extremities: Right Pedal edema, right calf tenderness Neurologic: Grossly no focal neuro deficit.  Labs on Admission:  CBC:  Recent Labs Lab 05/06/14 1823  WBC 4.4  NEUTROABS 2.5  HGB 14.5  HCT 42.3  MCV 93.2  PLT 156    CMP     Component Value Date/Time   NA 141 05/06/2014 1823   K 4.5 05/06/2014 1823   CL 102 05/06/2014 1823   CO2 25 05/06/2014 1823   GLUCOSE 90 05/06/2014 1823   BUN 10 05/06/2014 1823   CREATININE 1.23 05/06/2014 1823   CALCIUM 9.5 05/06/2014 1823   PROT 6.5 05/06/2014 1823   ALBUMIN 3.7 05/06/2014 1823   AST 70* 05/06/2014 1823   ALT 90* 05/06/2014 1823   ALKPHOS 52 05/06/2014 1823   BILITOT 0.7 05/06/2014 1823   GFRNONAA 65* 05/06/2014 1823   GFRAA 75* 05/06/2014 1823    No results for input(s): LIPASE, AMYLASE in the last 168 hours. No  results for input(s): AMMONIA in the last 168 hours.  No results for input(s): CKTOTAL, CKMB, CKMBINDEX, TROPONINI in the last 168 hours. BNP (last 3 results) No results for input(s): PROBNP in the last 8760 hours.  Radiological Exams on Admission: Ct Angio Chest Pe W/cm &/or Wo Cm  05/06/2014   CLINICAL DATA:  Shortness of breath. Deep venous thrombosis. Chest pain.  EXAM: CT ANGIOGRAPHY CHEST WITH CONTRAST  TECHNIQUE: Multidetector CT imaging of the chest was performed using the standard protocol during bolus administration of intravenous contrast. Multiplanar CT image reconstructions and MIPs were obtained to evaluate the vascular anatomy.  CONTRAST:  157mL OMNIPAQUE IOHEXOL 350 MG/ML SOLN  COMPARISON:  Chest x-ray dated 03/24/2014  FINDINGS: The patient has multiple bilateral pulmonary emboli including to the right upper and lower lobes. No emboli in the right middle lobe. There are emboli to the left upper lobe.  RV/LV ratio is 0.87, at the upper limits of normal.  There are no infiltrates or effusions. Overall heart size is normal. No acute osseous abnormality. Flowing osteophytes fuse much of the thoracic spine.  The visualized portion of the upper abdomen demonstrates numerous benign-appearing hepatic cysts.  Review of the MIP images confirms the above findings.  IMPRESSION: Multiple bilateral pulmonary emboli. Dr. Rolena Infante was informed of the findings and the patient was sent to the [facility] emergency room.   Electronically Signed   By: Rozetta Nunnery M.D.   On: 05/06/2014 17:10    Assessment/Plan Principal Problem:   Acute pulmonary embolism Active Problems:   Lumbar stenosis   Hx of decompressive lumbar laminectomy   Essential hypertension   Gout flare   GERD (gastroesophageal reflux disease)   1. Acute pulmonary embolism The patient is presenting with complaints of shortness of breath postoperatively along with swelling of his right leg. He had an outpatient ultrasound which was  positive for DVT on the right leg. He was sent here for further workup. CT scan shows pulmonary embolism including the right pulmonary artery with upper limit of normal RV to LV ratio. He is otherwise hemodynamically stable. Currently he would be admitted in telemetry and he will be receiving IV heparin. Orthopedic has been consulted who will be following up with the patient and will discuss with them about switching him to oral anticoagulation. Patient was given information about warfarin as well as newer oral anticoagulants.  2. Lumbar stenosis and decompressive lumbar laminectomy. Patient is on OxyIR at home. I would continue it at present. Avoiding Tylenol due to his liver enzymes.  3. GERD. Continue Protonix.  4. Gout flare. Patient has history of gout and is chronically on probenecid and colchicine. At present I would check uric acid levels and continue him on the same treatment.  5. Elevated LFT. Patient has elevated LFT preoperatively which has been thought secondary to alcohol abuse. At present patient denies any regular alcohol abuse. LFTs are improving. Continue close monitoring.  Advance goals of care discussion: Full code   Consults: Orthopedics  DVT Prophylaxis: on chronic anticoagulation Nutrition: Regular diet cardiac  Family Communication: Family was present at bedside, opportunity was given to ask question and all questions were answered satisfactorily at the time of interview. Disposition: Admitted to inpatient in telemetry unit.  Author: Berle Mull, MD [facility] Pager: [phone] 05/06/2014, 9:05 PM    If 7PM-7AM, please contact night-coverage [email or web address] Password [email or web address]

## 2014-05-07 DIAGNOSIS — K219 Gastro-esophageal reflux disease without esophagitis: Secondary | ICD-10-CM

## 2014-05-07 DIAGNOSIS — I2699 Other pulmonary embolism without acute cor pulmonale: Secondary | ICD-10-CM

## 2014-05-07 DIAGNOSIS — I10 Essential (primary) hypertension: Secondary | ICD-10-CM

## 2014-05-07 DIAGNOSIS — M109 Gout, unspecified: Secondary | ICD-10-CM

## 2014-05-07 LAB — APTT: aPTT: 123 seconds — ABNORMAL HIGH (ref 24–37)

## 2014-05-07 LAB — URIC ACID: URIC ACID, SERUM: 8.3 mg/dL — AB (ref 4.0–7.8)

## 2014-05-07 LAB — COMPREHENSIVE METABOLIC PANEL
ALBUMIN: 3.8 g/dL (ref 3.5–5.2)
ALK PHOS: 61 U/L (ref 39–117)
ALT: 84 U/L — ABNORMAL HIGH (ref 0–53)
AST: 57 U/L — AB (ref 0–37)
Anion gap: 12 (ref 5–15)
BUN: 10 mg/dL (ref 6–23)
CHLORIDE: 100 meq/L (ref 96–112)
CO2: 28 mEq/L (ref 19–32)
Calcium: 9.6 mg/dL (ref 8.4–10.5)
Creatinine, Ser: 1.18 mg/dL (ref 0.50–1.35)
GFR calc Af Amer: 79 mL/min — ABNORMAL LOW (ref >90)
GFR calc non Af Amer: 68 mL/min — ABNORMAL LOW (ref >90)
Glucose, Bld: 88 mg/dL (ref 70–99)
Potassium: 3.8 mEq/L (ref 3.7–5.3)
Sodium: 140 mEq/L (ref 137–147)
Total Bilirubin: 0.6 mg/dL (ref 0.3–1.2)
Total Protein: 6.7 g/dL (ref 6.0–8.3)

## 2014-05-07 LAB — PROTIME-INR
INR: 1.1 (ref 0.00–1.49)
PROTHROMBIN TIME: 14.4 s (ref 11.6–15.2)

## 2014-05-07 LAB — CBC
HEMATOCRIT: 42.8 % (ref 39.0–52.0)
Hemoglobin: 14.4 g/dL (ref 13.0–17.0)
MCH: 31.5 pg (ref 26.0–34.0)
MCHC: 33.6 g/dL (ref 30.0–36.0)
MCV: 93.7 fL (ref 78.0–100.0)
Platelets: 161 10*3/uL (ref 150–400)
RBC: 4.57 MIL/uL (ref 4.22–5.81)
RDW: 12.6 % (ref 11.5–15.5)
WBC: 5.7 10*3/uL (ref 4.0–10.5)

## 2014-05-07 LAB — TROPONIN I

## 2014-05-07 LAB — HEPARIN LEVEL (UNFRACTIONATED)
Heparin Unfractionated: 0.65 IU/mL (ref 0.30–0.70)
Heparin Unfractionated: 0.66 IU/mL (ref 0.30–0.70)

## 2014-05-07 MED ORDER — HYDROMORPHONE HCL 1 MG/ML IJ SOLN
1.0000 mg | INTRAMUSCULAR | Status: DC | PRN
  Administered 2014-05-07 – 2014-05-08 (×5): 1 mg via INTRAVENOUS
  Filled 2014-05-07 (×5): qty 1

## 2014-05-07 MED ORDER — RIVAROXABAN 20 MG PO TABS: 20.0000 mg | ORAL_TABLET | Freq: Every day | ORAL | Status: DC

## 2014-05-07 MED ORDER — PREDNISONE 50 MG PO TABS
50.0000 mg | ORAL_TABLET | Freq: Every day | ORAL | Status: DC
  Administered 2014-05-07 – 2014-05-08 (×2): 50 mg via ORAL
  Filled 2014-05-07 (×3): qty 1

## 2014-05-07 MED ORDER — ZOLPIDEM TARTRATE 5 MG PO TABS
5.0000 mg | ORAL_TABLET | Freq: Once | ORAL | Status: AC
  Administered 2014-05-07: 5 mg via ORAL
  Filled 2014-05-07: qty 1

## 2014-05-07 MED ORDER — RIVAROXABAN 15 MG PO TABS
15.0000 mg | ORAL_TABLET | Freq: Two times a day (BID) | ORAL | Status: DC
  Administered 2014-05-07 – 2014-05-08 (×2): 15 mg via ORAL
  Filled 2014-05-07 (×4): qty 1

## 2014-05-07 NOTE — Progress Notes (Signed)
PROGRESS NOTE    KARINA LENDERMAN CHE:[id number] DOB: [birth year] DOA: 05/06/2014 PCP: No PCP Per Patient  HPI/Brief narrative 53 year old male with history of hypertension, GERD, recent lumbar decompression surgery end of October 2015, had a six-week postop follow-up with his orthopedic M.D. where he complained of a right great toe pain, right dorsum of foot and shin burning sensation. Patient was sent for right lower extremity venous Doppler which was positive for DVT. He also complained of some dyspnea and underwent CTA chest which showed bilateral PE. He remained hemodynamically stable. He was initially started on IV heparin infusion and was transitioned to Xarelto 12/2.    Assessment/Plan:  1. Acute bilateral PE/acute RLE DVT: Likely precipitated by a relative immobility due to recent back surgery and right great toe pain. Patient does have family history of DVT in mother who also had colon cancer. Patient undergoes a regular screening colonoscopies and the next one is due sometime so on. Patient was started on IV heparin infusion. Discussed at length today with patient, spouse and daughter regarding options of oral anticoagulation-Coumadin versus NOAC. Patient has opted to go with Xarelto. Orthopedics is okay with anticoagulation. Duration of anticoagulation: At least 6 months but has to be determined during outpatient follow-up and maybe longer if hypercoagulable state is present. Patient may have to be evaluated as outpatient for hypercoagulable state. 2. Acute gouty arthritis of right great toe/podagra: Patient has been using colchicine at home until he started having diarrhea without significant relief. Has tried prednisone 3 weeks ago with good relief. Will not use NSAIDs due to being on anticoagulation and risk of bleeding. We'll start trial of prednisone. 3. Essential hypertension: Controlled. 4. Mildly abnormal LFTs: No GI symptoms. Patient denies regular alcohol use. Outpatient follow-up  and evaluation as deemed necessary. 5. Lumbar spinal stenosis, status post decompressive lumbar laminectomy: No significant back pain. Orthopedic follow-up appreciated. 6. History of GERD: Continue PPI.   Code Status: Full Family Communication: Discussed with patient spouse and daughter at bedside. Disposition Plan: Home in the next 24-48 hours.   Consultants:  Orthopedics  Procedures:  None  Antibiotics:  None   Subjective: Patient denies chest pain or dyspnea. Continues to have significant pain in the right great toe.  Objective: Filed Vitals:   05/06/14 2225 05/07/14 0444 05/07/14 0501 05/07/14 1303  BP:  133/75  121/78  Pulse:  70  65  Temp:  97.5 F (36.4 C)  98.1 F (36.7 C)  TempSrc:  Oral  Oral  Resp:  16  18  Height: 5\' 10"  (1.778 m)     Weight: 96.117 kg (211 lb 14.4 oz)  98.158 kg (216 lb 6.4 oz)   SpO2:  97%  96%   No intake or output data in the 24 hours ending 05/07/14 1559 Filed Weights   05/06/14 2225 05/07/14 0501  Weight: 96.117 kg (211 lb 14.4 oz) 98.158 kg (216 lb 6.4 oz)     Exam:  General exam: Pleasant middle-aged male lying comfortably in bed. Respiratory system: Clear. No increased work of breathing. Cardiovascular system: S1 & S2 heard, RRR. No JVD, murmurs, gallops, clicks or pedal edema. Telemetry: Sinus rhythm. Gastrointestinal system: Abdomen is nondistended, soft and nontender. Normal bowel sounds heard. Central nervous system: Alert and oriented. No focal neurological deficits. Extremities: Symmetric 5 x 5 power. Right great toe MTP joint area swollen, tender, painful ROS and minimal erythema and warmth.   Data Reviewed: Basic Metabolic Panel:  Recent Labs Lab 05/06/14 1823 05/07/14  0203  NA 141 140  K 4.5 3.8  CL 102 100  CO2 25 28  GLUCOSE 90 88  BUN 10 10  CREATININE 1.23 1.18  CALCIUM 9.5 9.6   Liver Function Tests:  Recent Labs Lab 05/06/14 1823 05/07/14 0203  AST 70* 57*  ALT 90* 84*  ALKPHOS 52 61    BILITOT 0.7 0.6  PROT 6.5 6.7  ALBUMIN 3.7 3.8   No results for input(s): LIPASE, AMYLASE in the last 168 hours. No results for input(s): AMMONIA in the last 168 hours. CBC:  Recent Labs Lab 05/06/14 1823 05/07/14 0203  WBC 4.4 5.7  NEUTROABS 2.5  --   HGB 14.5 14.4  HCT 42.3 42.8  MCV 93.2 93.7  PLT 156 161   Cardiac Enzymes:  Recent Labs Lab 05/07/14 0203  TROPONINI <0.30   BNP (last 3 results) No results for input(s): PROBNP in the last 8760 hours. CBG: No results for input(s): GLUCAP in the last 168 hours.  No results found for this or any previous visit (from the past 240 hour(s)).       Studies: Ct Angio Chest Pe W/cm &/or Wo Cm  05/06/2014   CLINICAL DATA:  Shortness of breath. Deep venous thrombosis. Chest pain.  EXAM: CT ANGIOGRAPHY CHEST WITH CONTRAST  TECHNIQUE: Multidetector CT imaging of the chest was performed using the standard protocol during bolus administration of intravenous contrast. Multiplanar CT image reconstructions and MIPs were obtained to evaluate the vascular anatomy.  CONTRAST:  128mL OMNIPAQUE IOHEXOL 350 MG/ML SOLN  COMPARISON:  Chest x-ray dated 03/24/2014  FINDINGS: The patient has multiple bilateral pulmonary emboli including to the right upper and lower lobes. No emboli in the right middle lobe. There are emboli to the left upper lobe.  RV/LV ratio is 0.87, at the upper limits of normal.  There are no infiltrates or effusions. Overall heart size is normal. No acute osseous abnormality. Flowing osteophytes fuse much of the thoracic spine.  The visualized portion of the upper abdomen demonstrates numerous benign-appearing hepatic cysts.  Review of the MIP images confirms the above findings.  IMPRESSION: Multiple bilateral pulmonary emboli. Dr. Rolena Infante was informed of the findings and the patient was sent to the [facility] emergency room.   Electronically Signed   By: Rozetta Nunnery M.D.   On: 05/06/2014 17:10        Scheduled Meds: .  colchicine  0.6 mg Oral Daily  . losartan  25 mg Oral Daily  . pantoprazole  40 mg Oral Daily  . predniSONE  50 mg Oral Q breakfast  . probenecid  500 mg Oral Daily  . rivaroxaban  15 mg Oral BID WC   Followed by  . [START ON 05/29/2014] rivaroxaban  20 mg Oral Q breakfast  . sodium chloride  3 mL Intravenous Q12H   Continuous Infusions: . heparin 1,400 Units/hr (05/07/14 0701)    Principal Problem:   Acute pulmonary embolism Active Problems:   Lumbar stenosis   Hx of decompressive lumbar laminectomy   Essential hypertension   Gout flare   GERD (gastroesophageal reflux disease)    Time spent: 40 minutes.    Vernell Leep, MD, FACP, FHM. [facility] Pager [phone]  If 7PM-7AM, please contact night-coverage [email or web address] Password [email or web address] 05/07/2014, 3:59 PM    LOS: 1 day

## 2014-05-07 NOTE — Plan of Care (Signed)
Problem: Phase I Progression Outcomes Goal: Dyspnea controlled at rest (PE) Outcome: Completed/Met Date Met:  05/07/14 Goal: Tolerating diet Outcome: Completed/Met Date Met:  05/07/14 Goal: Initial discharge plan identified Outcome: Progressing Goal: Voiding-avoid urinary catheter unless indicated Outcome: Completed/Met Date Met:  05/07/14 Goal: Hemodynamically stable Outcome: Completed/Met Date Met:  05/07/14 Goal: Other Phase I Outcomes/Goals Outcome: Completed/Met Date Met:  05/07/14

## 2014-05-07 NOTE — Progress Notes (Addendum)
Subjective: Doing well.  Still c/o right great toe pain.    Objective: Vital signs in last 24 hours: Temp:  [97.5 F (36.4 C)-97.9 F (36.6 C)] 97.5 F (36.4 C) (12/02 0444) Pulse Rate:  [64-86] 70 (12/02 0444) Resp:  [10-18] 16 (12/02 0444) BP: (133-159)/(75-99) 133/75 mmHg (12/02 0444) SpO2:  [95 %-100 %] 97 % (12/02 0444) Weight:  [96.117 kg (211 lb 14.4 oz)-98.158 kg (216 lb 6.4 oz)] 98.158 kg (216 lb 6.4 oz) (12/02 0501)  Intake/Output from previous day:   Intake/Output this shift:     Recent Labs  05/06/14 1823 05/07/14 0203  HGB 14.5 14.4    Recent Labs  05/06/14 1823 05/07/14 0203  WBC 4.4 5.7  RBC 4.54 4.57  HCT 42.3 42.8  PLT 156 161    Recent Labs  05/06/14 1823 05/07/14 0203  NA 141 140  K 4.5 3.8  CL 102 100  CO2 25 28  BUN 10 10  CREATININE 1.23 1.18  GLUCOSE 90 88  CALCIUM 9.5 9.6    Recent Labs  05/06/14 1823 05/07/14 0203  INR 1.09 1.10    Neurologically intact Compartment soft Right calf ttp Assessment/Plan: Continue present care.  Patient to make a decision on which oral anticoagulant he will use.  hospitlalist will f/u with him today.    Nicholas [name],Nicholas [name] 05/07/2014, 12:22 PM     Agree with above Patient stable at present No focal neurologic deficits.  Patient with known right EHL weakness Inicison intact no hematoma or swelling noted If patient develops incontinence of bowel/bladder or retention will need rectal exam and MRI to r/o cauda equina syndrome. Foley should not be placed unless cauda equina has been ruled out I will be out of town Thursday-Sunday. Patient is aware.  Dr. Tonita Cong and Linden Dolin are aware of situation and will be available if questions arise. Patient elected to proceed with xeralto

## 2014-05-07 NOTE — Progress Notes (Signed)
ANTICOAGULATION CONSULT NOTE - Follow up  Pharmacy Consult for IV Heparin Indication: pulmonary embolus  No Known Allergies  Patient Measurements: Height: 5\' 10"  (177.8 cm) Weight: 211 lb 14.4 oz (96.117 kg) IBW/kg (Calculated) : 73 Heparin Dosing Weight: 82 kg  Vital Signs: Temp: 97.9 F (36.6 C) (12/01 2121) Temp Source: Oral (12/01 2121) BP: 136/90 mmHg (12/01 2121) Pulse Rate: 86 (12/01 2030)  Labs:  Recent Labs  05/06/14 1823 05/07/14 0203 05/07/14 0204  HGB 14.5 14.4  --   HCT 42.3 42.8  --   PLT 156 161  --   APTT 28 123*  --   LABPROT 14.3 14.4  --   INR 1.09 1.10  --   HEPARINUNFRC  --   --  0.65  CREATININE 1.23 1.18  --   TROPONINI  --  <0.30  --     Estimated Creatinine Clearance: 83.2 mL/min (by C-G formula based on Cr of 1.18).   Medical History: Past Medical History  Diagnosis Date  . Hypertension   . GERD (gastroesophageal reflux disease)   . Arthritis     "my whole body"  . Chronic back pain     "mainly lower; some in top too" (03/26/2014)  . History of gout     Medications:  Scheduled:  . colchicine  0.6 mg Oral Daily  . losartan  25 mg Oral Daily  . pantoprazole  40 mg Oral Daily  . probenecid  500 mg Oral Daily  . sodium chloride  3 mL Intravenous Q12H   Infusions:  . heparin 1,400 Units/hr (05/06/14 1959)    Assessment: 1 yoM s/p back surgery 6 weeks ago now with SOB x 2 weeks found to have bilateral DVTs and multiple bilateral PEs on CT.   12/2: HL 0.65 (therapeutic) on 1400units/hr. No bleeding reported/documented.  Goal of Therapy:  Heparin level 0.3-0.7 units/ml Monitor platelets by anticoagulation protocol: Yes   Plan:   Cont heparin drip @ 1400units/hr  Recheck HL in 6 hours  Daily CBC/HL  Romeo Rabon, PharmD, pager [phone]. 05/07/2014,3:59 AM.

## 2014-05-07 NOTE — Progress Notes (Signed)
ANTICOAGULATION CONSULT NOTE - Initial Consult  Pharmacy Consult for Rivaroxaban Indication: Pulmonary Embolus, DVT  No Known Allergies  Patient Measurements: Height: 5\' 10"  (177.8 cm) Weight: 216 lb 6.4 oz (98.158 kg) IBW/kg (Calculated) : 73  Vital Signs: Temp: 98.1 F (36.7 C) (12/02 1303) Temp Source: Oral (12/02 1303) BP: 121/78 mmHg (12/02 1303) Pulse Rate: 65 (12/02 1303)  Labs:  Recent Labs  05/06/14 1823 05/07/14 0203 05/07/14 0204 05/07/14 0938  HGB 14.5 14.4  --   --   HCT 42.3 42.8  --   --   PLT 156 161  --   --   APTT 28 123*  --   --   LABPROT 14.3 14.4  --   --   INR 1.09 1.10  --   --   HEPARINUNFRC  --   --  0.65 0.66  CREATININE 1.23 1.18  --   --   TROPONINI  --  <0.30  --   --     Estimated Creatinine Clearance: 84.1 mL/min (by C-G formula based on Cr of 1.18).    Assessment: 89 yoM s/p back surgery 6 weeks ago now with SOB x 2 weeks found to have DVT on lower extremity doppler and multiple bilateral PEs on CT.  Pharmacy asked to transition patient from heparin drip to Xarelto.  Today, 12/2:   HL @ 0204: 0.65 (therapeutic)   HL @ 0938: 0.66 (therapeutic) on 1400units/hr  CBC WNL  No bleeding reported/documented.  Renal: SCr 1.18, CrCl ~ 84 mL/min CG  No drug interactions noted  Goal of Therapy:  Appropriate dosing for renal function and indication Resolution of PE/DVT Absence of bleeding issues  Plan:   Continue heparin drip at 1400 units/hr until 1700 tonight.  At 1700, d/c heparin drip and start Xarelto 15 mg PO BID with meals, then 20 mg PO daily with a meal thereafter.  Discussed medication with patient and family this morning.  Will provide written education as well.  Monitor renal function, CBC, and for signs and symptoms of bleeding.   Lindell Spar, PharmD, BCPS Pager: [phone] 05/07/2014 1:38 PM

## 2014-05-07 NOTE — Progress Notes (Signed)
ANTICOAGULATION CONSULT NOTE - Follow up  Pharmacy Consult for IV Heparin Indication: pulmonary embolus, DVT  No Known Allergies  Patient Measurements: Height: 5\' 10"  (177.8 cm) Weight: 216 lb 6.4 oz (98.158 kg) IBW/kg (Calculated) : 73  Vital Signs: Temp: 97.5 F (36.4 C) (12/02 0444) Temp Source: Oral (12/02 0444) BP: 133/75 mmHg (12/02 0444) Pulse Rate: 70 (12/02 0444)  Labs:  Recent Labs  05/06/14 1823 05/07/14 0203 05/07/14 0204 05/07/14 0938  HGB 14.5 14.4  --   --   HCT 42.3 42.8  --   --   PLT 156 161  --   --   APTT 28 123*  --   --   LABPROT 14.3 14.4  --   --   INR 1.09 1.10  --   --   HEPARINUNFRC  --   --  0.65 0.66  CREATININE 1.23 1.18  --   --   TROPONINI  --  <0.30  --   --     Estimated Creatinine Clearance: 84.1 mL/min (by C-G formula based on Cr of 1.18).   Medical History: Past Medical History  Diagnosis Date  . Hypertension   . GERD (gastroesophageal reflux disease)   . Arthritis     "my whole body"  . Chronic back pain     "mainly lower; some in top too" (03/26/2014)  . History of gout     Medications:  Scheduled:  . colchicine  0.6 mg Oral Daily  . losartan  25 mg Oral Daily  . pantoprazole  40 mg Oral Daily  . probenecid  500 mg Oral Daily  . sodium chloride  3 mL Intravenous Q12H   Infusions:  . heparin 1,400 Units/hr (05/07/14 0701)    Assessment: 69 yoM s/p back surgery 6 weeks ago now with SOB x 2 weeks found to have DVT on lower extremity doppler and multiple bilateral PEs on CT.   Today, 12/2:   HL @ 0204: 0.65 (therapeutic)   HL @ 0938: 0.66 (therapeutic) on 1400units/hr  CBC WNL  No bleeding reported/documented.  Goal of Therapy:  Heparin level 0.3-0.7 units/ml Monitor platelets by anticoagulation protocol: Yes   Plan:   Continue heparin drip @ 1400units/hr  Daily HL and CBC while on heparin therapy  F/U transition to oral anticoagulation   Lindell Spar, PharmD, BCPS Pager: [phone] 05/07/2014  11:38 AM'

## 2014-05-08 DIAGNOSIS — M1000 Idiopathic gout, unspecified site: Secondary | ICD-10-CM

## 2014-05-08 LAB — CBC
HCT: 40.8 % (ref 39.0–52.0)
Hemoglobin: 13.7 g/dL (ref 13.0–17.0)
MCH: 31 pg (ref 26.0–34.0)
MCHC: 33.6 g/dL (ref 30.0–36.0)
MCV: 92.3 fL (ref 78.0–100.0)
PLATELETS: 166 10*3/uL (ref 150–400)
RBC: 4.42 MIL/uL (ref 4.22–5.81)
RDW: 12.3 % (ref 11.5–15.5)
WBC: 4.7 10*3/uL (ref 4.0–10.5)

## 2014-05-08 MED ORDER — PREDNISONE 10 MG PO TABS: ORAL_TABLET | ORAL | Status: DC

## 2014-05-08 MED ORDER — RIVAROXABAN (XARELTO) VTE STARTER PACK (15 & 20 MG): ORAL_TABLET | ORAL | Status: DC

## 2014-05-08 NOTE — Progress Notes (Signed)
Subjective: Doing well.  Denies back pain or LE radicular pain.  No voiding issues.  Right foot pain somewhat better. Denies cp, sob.      Objective: Vital signs in last 24 hours: Temp:  [97.5 F (36.4 C)-98.1 F (36.7 C)] 97.5 F (36.4 C) (12/03 0545) Pulse Rate:  [65-85] 85 (12/03 0545) Resp:  [16-18] 18 (12/03 0545) BP: (121-136)/(78-97) 136/97 mmHg (12/03 0545) SpO2:  [94 %-97 %] 95 % (12/03 0910) Weight:  [98.385 kg (216 lb 14.4 oz)] 98.385 kg (216 lb 14.4 oz) (12/03 0429)  Intake/Output from previous day: 12/02 0701 - 12/03 0700 In: 592 [P.O.:480; I.V.:112] Out: -  Intake/Output this shift:     Recent Labs  05/06/14 1823 05/07/14 0203 05/08/14 0515  HGB 14.5 14.4 13.7    Recent Labs  05/07/14 0203 05/08/14 0515  WBC 5.7 4.7  RBC 4.57 4.42  HCT 42.8 40.8  PLT 161 166    Recent Labs  05/06/14 1823 05/07/14 0203  NA 141 140  K 4.5 3.8  CL 102 100  CO2 25 28  BUN 10 10  CREATININE 1.23 1.18  GLUCOSE 90 88  CALCIUM 9.5 9.6    Recent Labs  05/06/14 1823 05/07/14 0203  INR 1.09 1.10    Neurologically intact Dorsiflexion/Plantar flexion intact Compartment soft Lumbar spine nontender.  No swelling.   Assessment/Plan: Continue present care.  D/c planning per medical service.  Ambulated well and with complications.     Brand Siever M 05/08/2014, 12:27 PM

## 2014-05-08 NOTE — Plan of Care (Signed)
Problem: Discharge Progression Outcomes Goal: Barriers To Progression Addressed/Resolved Outcome: Completed/Met Date Met:  05/08/14 Goal: Discharge plan in place and appropriate Outcome: Completed/Met Date Met:  05/08/14 Goal: Pain controlled with appropriate interventions Outcome: Completed/Met Date Met:  05/08/14 Goal: Hemodynamically stable Outcome: Completed/Met Date Met:  17/83/75 Goal: Complications resolved/controlled Outcome: Completed/Met Date Met:  05/08/14 Goal: Tolerating diet Outcome: Completed/Met Date Met:  05/08/14 Goal: Activity appropriate for discharge plan Outcome: Completed/Met Date Met:  05/08/14 Goal: Other Discharge Outcomes/Goals Outcome: Completed/Met Date Met:  05/08/14

## 2014-05-08 NOTE — Discharge Instructions (Addendum)
Information on my medicine - XARELTO (rivaroxaban)  This medication education was reviewed with me or my healthcare representative as part of my discharge preparation.  The pharmacist that spoke with me during my hospital stay was:  Luiz Ochoa, [place]? Xarelto was prescribed to treat blood clots that may have been found in the veins of your legs (deep vein thrombosis) or in your lungs (pulmonary embolism) and to reduce the risk of them occurring again.  What do you need to know about Xarelto? The starting dose is one 15 mg tablet taken TWICE daily with food for the FIRST 21 DAYS then the dose is changed to one 20 mg tablet taken ONCE A DAY with a meal.  DO NOT stop taking Xarelto without talking to the health care provider who prescribed the medication.  Refill your prescription for 20 mg tablets before you run out.  After discharge, you should have regular check-up appointments with your healthcare provider that is prescribing your Xarelto.  In the future your dose may need to be changed if your kidney function changes by a significant amount.  What do you do if you miss a dose? If you are taking Xarelto TWICE DAILY and you miss a dose, take it as soon as you remember. You may take two 15 mg tablets (total 30 mg) at the same time then resume your regularly scheduled 15 mg twice daily the next day.  If you are taking Xarelto ONCE DAILY and you miss a dose, take it as soon as you remember on the same day then continue your regularly scheduled once daily regimen the next day. Do not take two doses of Xarelto at the same time.   Important Safety Information Xarelto is a blood thinner medicine that can cause bleeding. You should call your healthcare provider right away if you experience any of the following: ? Bleeding from an injury or your nose that does not stop. ? Unusual colored urine (red or dark brown) or unusual colored stools (red or  black). ? Unusual bruising for unknown reasons. ? A serious fall or if you hit your head (even if there is no bleeding).  Some medicines may interact with Xarelto and might increase your risk of bleeding while on Xarelto. To help avoid this, consult your healthcare provider or pharmacist prior to using any new prescription or non-prescription medications, including herbals, vitamins, non-steroidal anti-inflammatory drugs (NSAIDs) and supplements.  This website has more information on Xarelto: [email or web address]  Pulmonary Embolism A pulmonary (lung) embolism (PE) is a blood clot that has traveled to the lung and results in a blockage of blood flow in the affected lung. Most clots come from deep veins in the legs or pelvis. PE is a dangerous and potentially life-threatening condition that can be treated if identified. CAUSES Blood clots form in a vein for different reasons. Usually several things cause blood clots. They include:  The flow of blood slows down.  The inside of the vein is damaged in some way.  The person has a condition that makes the blood clot more easily. RISK FACTORS Some people are more likely than others to develop PE. Risk factors include:   Smoking.  Being overweight (obese).  Sitting or lying still for a long time. This includes long-distance travel, paralysis, or recovery from an illness or surgery. Other factors that increase risk are:   Older age, especially over 7 years of age.  Having a family history of  blood clots or if you have already had a blood clot.  Having major or lengthy surgery. This is especially true for surgery on the hip, knee, or belly (abdomen). Hip surgery is particularly high risk.  Having a long, thin tube (catheter) placed inside a vein during a medical procedure.  Breaking a hip or leg.  Having cancer or cancer treatment.  Medicines containing the male hormone estrogen. This includes birth control pills and hormone  replacement therapy.  Other circulation or heart problems.  Pregnancy and childbirth.  Hormone changes make the blood clot more easily during pregnancy.  The fetus puts pressure on the veins of the pelvis.  There is a risk of injury to veins during delivery or a caesarean delivery. The risk is highest just after childbirth.  PREVENTION   Exercise the legs regularly. Take a brisk 30 minute walk every day.  Maintain a weight that is appropriate for your height.  Avoid sitting or lying in bed for long periods of time without moving your legs.  Women, particularly those over the age of 39 years, should consider the risks and benefits of taking estrogen medicines, including birth control pills.  Do not smoke, especially if you take estrogen medicines.  Long-distance travel can increase your risk. You should exercise your legs by walking or pumping the muscles every hour.  Many of the risk factors above relate to situations that exist with hospitalization, either for illness, injury, or elective surgery. Prevention may include medical and nonmedical measures.   Your health care provider will assess you for the need for venous thromboembolism prevention when you are admitted to the hospital. If you are having surgery, your surgeon will assess you the day of or day after surgery.  SYMPTOMS  The symptoms of a PE usually start suddenly and include:  Shortness of breath.  Coughing.  Coughing up blood or blood-tinged mucus.  Chest pain. Pain is often worse with deep breaths.  Rapid heartbeat. DIAGNOSIS  If a PE is suspected, your health care provider will take a medical history and perform a physical exam. Other tests that may be required include:  Blood tests, such as studies of the clotting properties of your blood.  Imaging tests, such as ultrasound, CT, MRI, and other tests to see if you have clots in your legs or lungs.  An electrocardiogram. This can look for heart strain  from blood clots in the lungs. TREATMENT   The most common treatment for a PE is blood thinning (anticoagulant) medicine, which reduces the blood's tendency to clot. Anticoagulants can stop new blood clots from forming and old clots from growing. They cannot dissolve existing clots. Your body does this by itself over time. Anticoagulants can be given by mouth, through an intravenous (IV) tube, or by injection. Your health care provider will determine the best program for you.  Less commonly, clot-dissolving medicines (thrombolytics) are used to dissolve a PE. They carry a high risk of bleeding, so they are used mainly in severe cases.  Very rarely, a blood clot in the leg needs to be removed surgically.  If you are unable to take anticoagulants, your health care provider may arrange for you to have a filter placed in a main vein in your abdomen. This filter prevents clots from traveling to your lungs. HOME CARE INSTRUCTIONS   Take all medicines as directed by your health care provider.  Learn as much as you can about DVT.  Wear a medical alert bracelet or carry a  medical alert card.  Ask your health care provider how soon you can go back to normal activities. It is important to stay active to prevent blood clots. If you are on anticoagulant medicine, avoid contact sports.  It is very important to exercise. This is especially important while traveling, sitting, or standing for long periods of time. Exercise your legs by walking or by tightening and relaxing your leg muscles regularly. Take frequent walks.  You may need to wear compression stockings. These are tight elastic stockings that apply pressure to the lower legs. This pressure can help keep the blood in the legs from clotting. Taking Warfarin Warfarin is a daily medicine that is taken by mouth. Your health care provider will advise you on the length of treatment (usually 3-6 months, sometimes lifelong). If you take  warfarin:  Understand how to take warfarin and foods that can affect how warfarin works in Veterinary surgeon.  Too much and too little warfarin are both dangerous. Too much warfarin increases the risk of bleeding. Too little warfarin continues to allow the risk for blood clots. Warfarin and Regular Blood Testing While taking warfarin, you will need to have regular blood tests to measure your blood clotting time. These blood tests usually include both the prothrombin time (PT) and international normalized ratio (INR) tests. The PT and INR results allow your health care provider to adjust your dose of warfarin. It is very important that you have your PT and INR tested as often as directed by your health care provider.  Warfarin and Your Diet Avoid major changes in your diet, or notify your health care provider before changing your diet. Arrange a visit with a registered dietitian to answer your questions. Many foods, especially foods high in vitamin K, can interfere with warfarin and affect the PT and INR results. You should eat a consistent amount of foods high in vitamin K. Foods high in vitamin K include:   Spinach, kale, broccoli, cabbage, collard and turnip greens, Brussels sprouts, peas, cauliflower, seaweed, and parsley.  Beef and pork liver.  Green tea.  Soybean oil. Warfarin with Other Medicines Many medicines can interfere with warfarin and affect the PT and INR results. You must:  Tell your health care provider about any and all medicines, vitamins, and supplements you take, including aspirin and other over-the-counter anti-inflammatory medicines. Be especially cautious with aspirin and anti-inflammatory medicines. Ask your health care provider before taking these.  Do not take or discontinue any prescribed or over-the-counter medicine except on the advice of your health care provider or pharmacist. Warfarin Side Effects Warfarin can have side effects, such as easy bruising and difficulty  stopping bleeding. Ask your health care provider or pharmacist about other side effects of warfarin. You will need to:  Hold pressure over cuts for longer than usual.  Notify your dentist and other health care providers that you are taking warfarin before you undergo any procedures where bleeding may occur. Warfarin with Alcohol and Tobacco   Drinking alcohol frequently can increase the effect of warfarin, leading to excess bleeding. It is best to avoid alcoholic drinks or consume only very small amounts while taking warfarin. Notify your health care provider if you change your alcohol intake.  Do not use any tobacco products including cigarettes, chewing tobacco, or electronic cigarettes. If you smoke, quit. Ask your health care provider for help with quitting smoking. Alternative Medicines to Warfarin: Factor Xa Inhibitor Medicines  These blood thinning medicines are taken by mouth, usually for several  weeks or longer. It is important to take the medicine every single day, at the same time each day.  There are no regular blood tests required when using these medicines.  There are fewer food and drug interactions than with warfarin.  The side effects of this class of medicine is similar to that of warfarin, including excessive bruising or bleeding. Ask your health care provider or pharmacist about other potential side effects. SEEK MEDICAL CARE IF:   You notice a rapid heartbeat.  You feel weaker or more tired than usual.  You feel faint.  You notice increased bruising.  Your symptoms are not getting better in the time expected.  You are having side effects of medicine. SEEK IMMEDIATE MEDICAL CARE IF:   You have chest pain.  You have trouble breathing.  You have new or increased swelling or pain in one leg.  You cough up blood.  You notice blood in vomit, in a bowel movement, or in urine.  You have a fever. Symptoms of PE may represent a serious problem that is an  emergency. Do not wait to see if the symptoms will go away. Get medical help right away. Call your local emergency services (911 in the [place]). Do not drive yourself to the hospital. Document Released: 05/20/2000 Document Revised: 10/07/2013 Document Reviewed: 06/03/2013 [facility] Patient Information 2015 [place], [place]. This information is not intended to replace advice given to you by your health care provider. Make sure you discuss any questions you have with your health care provider.  Deep Vein Thrombosis A deep vein thrombosis (DVT) is a blood clot that develops in the deep, larger veins of the leg, arm, or pelvis. These are more dangerous than clots that might form in veins near the surface of the body. A DVT can lead to serious and even life-threatening complications if the clot breaks off and travels in the bloodstream to the lungs.  A DVT can damage the valves in your leg veins so that instead of flowing upward, the blood pools in the lower leg. This is called post-thrombotic syndrome, and it can result in pain, swelling, discoloration, and sores on the leg. CAUSES Usually, several things contribute to the formation of blood clots. Contributing factors include:  The flow of blood slows down.  The inside of the vein is damaged in some way.  You have a condition that makes blood clot more easily. RISK FACTORS Some people are more likely than others to develop blood clots. Risk factors include:   Smoking.  Being overweight (obese).  Sitting or lying still for a long time. This includes long-distance travel, paralysis, or recovery from an illness or surgery. Other factors that increase risk are:   Older age, especially over 53 years of age.  Having a family history of blood clots or if you have already had a blot clot.  Having major or lengthy surgery. This is especially true for surgery on the hip, knee, or belly (abdomen). Hip surgery is particularly high  risk.  Having a long, thin tube (catheter) placed inside a vein during a medical procedure.  Breaking a hip or leg.  Having cancer or cancer treatment.  Pregnancy and childbirth.  Hormone changes make the blood clot more easily during pregnancy.  The fetus puts pressure on the veins of the pelvis.  There is a risk of injury to veins during delivery or a caesarean delivery. The risk is highest just after childbirth.  Medicines containing the male hormone estrogen. This  includes birth control pills and hormone replacement therapy.  Other circulation or heart problems.  SIGNS AND SYMPTOMS When a clot forms, it can either partially or totally block the blood flow in that vein. Symptoms of a DVT can include:  Swelling of the leg or arm, especially if one side is much worse.  Warmth and redness of the leg or arm, especially if one side is much worse.  Pain in an arm or leg. If the clot is in the leg, symptoms may be more noticeable or worse when standing or walking. The symptoms of a DVT that has traveled to the lungs (pulmonary embolism, PE) usually start suddenly and include:  Shortness of breath.  Coughing.  Coughing up blood or blood-tinged mucus.  Chest pain. The chest pain is often worse with deep breaths.  Rapid heartbeat. Anyone with these symptoms should get emergency medical treatment right away. Do not wait to see if the symptoms will go away. Call your local emergency services (911 in the [place] if you have these symptoms. Do not drive yourself to the hospital. DIAGNOSIS If a DVT is suspected, your health care provider will take a full medical history and perform a physical exam. Tests that also may be required include:  Blood tests, including studies of the clotting properties of the blood.  Ultrasound to see if you have clots in your legs or lungs.  X-rays to show the flow of blood when dye is injected into the veins (venogram).  Studies of your lungs if you  have any chest symptoms. PREVENTION  Exercise the legs regularly. Take a brisk 30-minute walk every day.  Maintain a weight that is appropriate for your height.  Avoid sitting or lying in bed for long periods of time without moving your legs.  Women, particularly those over the age of 74 years, should consider the risks and benefits of taking estrogen medicines, including birth control pills.  Do not smoke, especially if you take estrogen medicines.  Long-distance travel can increase your risk of DVT. You should exercise your legs by walking or pumping the muscles every hour.  Many of the risk factors above relate to situations that exist with hospitalization, either for illness, injury, or elective surgery. Prevention may include medical and nonmedical measures.  Your health care provider will assess you for the need for venous thromboembolism prevention when you are admitted to the hospital. If you are having surgery, your surgeon will assess you the day of or day after surgery. TREATMENT Once identified, a DVT can be treated. It can also be prevented in some circumstances. Once you have had a DVT, you may be at increased risk for a DVT in the future. The most common treatment for DVT is blood-thinning (anticoagulant) medicine, which reduces the blood's tendency to clot. Anticoagulants can stop new blood clots from forming and stop old clots from growing. They cannot dissolve existing clots. Your body does this by itself over time. Anticoagulants can be given by mouth, through an IV tube, or by injection. Your health care provider will determine the best program for you. Other medicines or treatments that may be used are:  Heparin or related medicines (low molecular weight heparin) are often the first treatment for a blood clot. They act quickly. However, they cannot be taken orally and must be given either in shot form or by IV tube.  Heparin can cause a fall in a component of blood that  stops bleeding and forms blood clots (platelets). You  will be monitored with blood tests to be sure this does not occur.  Warfarin is an anticoagulant that can be swallowed. It takes a few days to start working, so usually heparin or related medicines are used in combination. Once warfarin is working, heparin is usually stopped.  Factor Xa inhibitor medicines, such as rivaroxaban and apixaban, also reduce blood clotting. These medicines are taken orally and can often be used without heparin or related medicines.  Less commonly, clot dissolving drugs (thrombolytics) are used to dissolve a DVT. They carry a high risk of bleeding, so they are used mainly in severe cases where your life or a part of your body is threatened.  Very rarely, a blood clot in the leg needs to be removed surgically.  If you are unable to take anticoagulants, your health care provider may arrange for you to have a filter placed in a main vein in your abdomen. This filter prevents clots from traveling to your lungs. HOME CARE INSTRUCTIONS  Take all medicines as directed by your health care provider.  Learn as much as you can about DVT.  Wear a medical alert bracelet or carry a medical alert card.  Ask your health care provider how soon you can go back to normal activities. It is important to stay active to prevent blood clots. If you are on anticoagulant medicine, avoid contact sports.  It is very important to exercise. This is especially important while traveling, sitting, or standing for long periods of time. Exercise your legs by walking or by tightening and relaxing your leg muscles regularly. Take frequent walks.  You may need to wear compression stockings. These are tight elastic stockings that apply pressure to the lower legs. This pressure can help keep the blood in the legs from clotting. Taking Warfarin Warfarin is a daily medicine that is taken by mouth. Your health care provider will advise you on the length  of treatment (usually 3-6 months, sometimes lifelong). If you take warfarin:  Understand how to take warfarin and foods that can affect how warfarin works in Veterinary surgeon.  Too much and too little warfarin are both dangerous. Too much warfarin increases the risk of bleeding. Too little warfarin continues to allow the risk for blood clots. Warfarin and Regular Blood Testing While taking warfarin, you will need to have regular blood tests to measure your blood clotting time. These blood tests usually include both the prothrombin time (PT) and international normalized ratio (INR) tests. The PT and INR results allow your health care provider to adjust your dose of warfarin. It is very important that you have your PT and INR tested as often as directed by your health care provider.  Warfarin and Your Diet Avoid major changes in your diet, or notify your health care provider before changing your diet. Arrange a visit with a registered dietitian to answer your questions. Many foods, especially foods high in vitamin K, can interfere with warfarin and affect the PT and INR results. You should eat a consistent amount of foods high in vitamin K. Foods high in vitamin K include:   Spinach, kale, broccoli, cabbage, collard and turnip greens, Brussels sprouts, peas, cauliflower, seaweed, and parsley.  Beef and pork liver.  Green tea.  Soybean oil. Warfarin with Other Medicines Many medicines can interfere with warfarin and affect the PT and INR results. You must:  Tell your health care provider about any and all medicines, vitamins, and supplements you take, including aspirin and other over-the-counter anti-inflammatory medicines.  Be especially cautious with aspirin and anti-inflammatory medicines. Ask your health care provider before taking these.  Do not take or discontinue any prescribed or over-the-counter medicine except on the advice of your health care provider or pharmacist. Warfarin Side  Effects Warfarin can have side effects, such as easy bruising and difficulty stopping bleeding. Ask your health care provider or pharmacist about other side effects of warfarin. You will need to:  Hold pressure over cuts for longer than usual.  Notify your dentist and other health care providers that you are taking warfarin before you undergo any procedures where bleeding may occur. Warfarin with Alcohol and Tobacco   Drinking alcohol frequently can increase the effect of warfarin, leading to excess bleeding. It is best to avoid alcoholic drinks or to consume only very small amounts while taking warfarin. Notify your health care provider if you change your alcohol intake.   Do not use any tobacco products including cigarettes, chewing tobacco, or electronic cigarettes. If you smoke, quit. Ask your health care provider for help with quitting smoking. Alternative Medicines to Warfarin: Factor Xa Inhibitor Medicines  These blood-thinning medicines are taken by mouth, usually for several weeks or longer. It is important to take the medicine every single day at the same time each day.  There are no regular blood tests required when using these medicines.  There are fewer food and drug interactions than with warfarin.  The side effects of this class of medicine are similar to those of warfarin, including excessive bruising or bleeding. Ask your health care provider or pharmacist about other potential side effects. SEEK MEDICAL CARE IF:  You notice a rapid heartbeat.  You feel weaker or more tired than usual.  You feel faint.  You notice increased bruising.  You feel your symptoms are not getting better in the time expected.  You believe you are having side effects of medicine. SEEK IMMEDIATE MEDICAL CARE IF:  You have chest pain.  You have trouble breathing.  You have new or increased swelling or pain in one leg.  You cough up blood.  You notice blood in vomit, in a bowel  movement, or in urine. MAKE SURE YOU:  Understand these instructions.  Will watch your condition.  Will get help right away if you are not doing well or get worse. Document Released: 05/23/2005 Document Revised: 10/07/2013 Document Reviewed: 01/28/2013 [facility] Patient Information 2015 [place], [place]. This information is not intended to replace advice given to you by your health care provider. Make sure you discuss any questions you have with your health care provider.

## 2014-05-08 NOTE — Progress Notes (Signed)
Pt left at this time with his spouse at his side. Alert, oriented, and without c/o. Discharge instructions/prescriptions (and Xarelto packet) given/explained with pt and spouse verbalizing understanding. Followup appts. Noted.

## 2014-05-08 NOTE — Discharge Summary (Signed)
Physician Discharge Summary  AKBAR SACRA HAL:[id number] DOB: 11-20-1960 DOA: 05/06/2014  PCP: No PCP Per Patient  Admit date: 05/06/2014 Discharge date: 05/08/2014  Time spent: Less than 30 minutes  Recommendations for Outpatient Follow-up:  1. PCP of choice in 1 week. Patient apparently has PCP in the [facility] but he and his spouse are trying to find a PCP locally in [place] and have been provided with a list of PCPs to call. 2. Dr. Charlann Noss, Orthopedics.  Discharge Diagnoses:  Principal Problem:   Acute pulmonary embolism Active Problems:   Lumbar stenosis   Hx of decompressive lumbar laminectomy   Essential hypertension   Gout flare   GERD (gastroesophageal reflux disease)   Discharge Condition: Improved & Stable  Diet recommendation: Heart healthy diet.  Filed Weights   05/06/14 2225 05/07/14 0501 05/08/14 0429  Weight: 96.117 kg (211 lb 14.4 oz) 98.158 kg (216 lb 6.4 oz) 98.385 kg (216 lb 14.4 oz)    History of present illness:  53 year old male with history of hypertension, GERD, recent lumbar decompression surgery end of October 2015, had a six-week postop follow-up with his orthopedic M.D. where he complained of a right great toe pain, right dorsum of foot and shin burning sensation. Patient was sent for right lower extremity venous Doppler which was positive for DVT. He also complained of some dyspnea and underwent CTA chest which showed bilateral PE. He remained hemodynamically stable. He was initially started on IV heparin infusion and was transitioned to Xarelto 12/2.  Hospital Course:   1. Acute bilateral PE/acute RLE DVT: Likely precipitated by a relative immobility due to recent back surgery and right great toe pain. Patient does have family history of DVT in mother who had colon cancer. Patient undergoes regular screening colonoscopies and the next one is due sometime soon. Patient was started on IV heparin infusion. Discussed at length with patient,  spouse and daughter regarding options of oral anticoagulation-Coumadin versus NOAC. Patient has opted to go with Xarelto. Orthopedics is okay with anticoagulation. Duration of anticoagulation: At least 6 months but has to be determined during outpatient follow-up and maybe longer if hypercoagulable state is present. Patient may have to be evaluated as outpatient for hypercoagulable state. Discussed with radiologist will read CTA chest and no evidence of right heart strain. Discussed all of this at length on multiple locations with patient and spouse and they verbalize understanding. 2. Acute gouty arthritis of right great toe/podagra: Patient has been using colchicine at home until he started having diarrhea without significant relief. Has tried prednisone 3 weeks ago with good relief. Will not use NSAIDs due to being on anticoagulation and risk of bleeding. Started prednisone taper and is improving. Will discharge on prednisone taper. 3. Essential hypertension: Controlled. Continue ARB. 4. Mildly abnormal LFTs: No GI symptoms. Patient denies regular alcohol use. Outpatient follow-up and evaluation as deemed necessary. 5. Lumbar spinal stenosis, status post decompressive lumbar laminectomy: No significant back pain. Orthopedic follow-up appreciated. 6. History of GERD: Continue PPI.  Consultations:  Orthopedics  Procedures:  None    Discharge Exam:  Complaints:  No chest pain or dyspnea. Right great toe pain has improved. Patient seen ambulating comfortably in hallway, without significant pain.  Filed Vitals:   05/08/14 0900 05/08/14 0902 05/08/14 0910 05/08/14 1338  BP:    142/83  Pulse:    94  Temp:    97.8 F (36.6 C)  TempSrc:    Oral  Resp:    18  Height:  Weight:      SpO2: 97% 97% 95% 94%    General exam: Pleasant middle-aged male lying comfortably in bed. Respiratory system: Clear. No increased work of breathing. Cardiovascular system: S1 & S2 heard, RRR. No JVD,  murmurs, gallops, clicks or pedal edema. Telemetry: Sinus rhythm. Gastrointestinal system: Abdomen is nondistended, soft and nontender. Normal bowel sounds heard. Central nervous system: Alert and oriented. No focal neurological deficits. Extremities: Symmetric 5 x 5 power. Right great toe MTP joint area swollen, tender, painful ROS and minimal erythema and warmth-improving.  Discharge Instructions      Discharge Instructions    Call MD for:  difficulty breathing, headache or visual disturbances    Complete by:  As directed      Call MD for:  severe uncontrolled pain    Complete by:  As directed      Diet - low sodium heart healthy    Complete by:  As directed      Increase activity slowly    Complete by:  As directed             Medication List    STOP taking these medications        docusate sodium 100 MG capsule  Commonly known as:  COLACE     polyethylene glycol packet  Commonly known as:  MIRALAX / GLYCOLAX      TAKE these medications        colchicine 0.6 MG tablet  Take 0.6 mg by mouth daily.     diazepam 10 MG tablet  Commonly known as:  VALIUM  Take 1 tablet by mouth at bedtime as needed. For sleep     losartan 25 MG tablet  Commonly known as:  COZAAR  Take 25 mg by mouth daily.     methocarbamol 500 MG tablet  Commonly known as:  ROBAXIN  Take 1 tablet (500 mg total) by mouth every 6 (six) hours as needed for muscle spasms.     omeprazole 20 MG capsule  Commonly known as:  PRILOSEC  Take 20 mg by mouth daily.     ondansetron 4 MG disintegrating tablet  Commonly known as:  ZOFRAN ODT  Take 1 tablet (4 mg total) by mouth every 8 (eight) hours as needed.     oxyCODONE 15 MG immediate release tablet  Commonly known as:  ROXICODONE  Take 1 tablet (15 mg total) by mouth every 6 (six) hours as needed for pain.     predniSONE 10 MG tablet  Commonly known as:  DELTASONE  Start on 05/09/14: Take 4 tabs daily 2 days, then 3 tabs daily 2 days, then 2  tabs daily 2 days, then 1 tab daily 2 days, then stop.     probenecid 500 MG tablet  Commonly known as:  BENEMID  Take 500 mg by mouth daily.     Rivaroxaban 15 & 20 MG Tbpk  Commonly known as:  XARELTO STARTER PACK  Take as directed on package: Start with one 15mg  tablet by mouth twice a day with food. On Day 22, switch to one 20mg  tablet once a day with food.     VISINE OP  Place 1 drop into both eyes 3 (three) times daily.       Follow-up Information    Follow up with Primary Medical Doctor of choice. Schedule an appointment as soon as possible for a visit in 1 week.      Follow up with Dahlia Bailiff, MD.  Specialty:  Orthopedic Surgery   Contact information:   [address] [place] [address] [place] [address] [phone]        The results of significant diagnostics from this hospitalization (including imaging, microbiology, ancillary and laboratory) are listed below for reference.    Significant Diagnostic Studies: Ct Angio Chest Pe W/cm &/or Wo Cm  05/06/2014   CLINICAL DATA:  Shortness of breath. Deep venous thrombosis. Chest pain.  EXAM: CT ANGIOGRAPHY CHEST WITH CONTRAST  TECHNIQUE: Multidetector CT imaging of the chest was performed using the standard protocol during bolus administration of intravenous contrast. Multiplanar CT image reconstructions and MIPs were obtained to evaluate the vascular anatomy.  CONTRAST:  139mL OMNIPAQUE IOHEXOL 350 MG/ML SOLN  COMPARISON:  Chest x-ray dated 03/24/2014  FINDINGS: The patient has multiple bilateral pulmonary emboli including to the right upper and lower lobes. No emboli in the right middle lobe. There are emboli to the left upper lobe.  RV/LV ratio is 0.87, at the upper limits of normal.  There are no infiltrates or effusions. Overall heart size is normal. No acute osseous abnormality. Flowing osteophytes fuse much of the thoracic spine.  The visualized portion of the upper abdomen demonstrates numerous benign-appearing  hepatic cysts.  Review of the MIP images confirms the above findings.  IMPRESSION: Multiple bilateral pulmonary emboli. Dr. Rolena Infante was informed of the findings and the patient was sent to the [facility] emergency room.   Electronically Signed   By: Rozetta Nunnery M.D.   On: 05/06/2014 17:10   Bilateral lower extremity venous Dopplers 05/06/14: Summary:  - Findings consistent with deep vein thrombosis involving the right popliteal vein, right posterial tibial vein, and right peroneal vein. - No evidence of deep vein thrombosis involving the left lower extremity.  Microbiology: No results found for this or any previous visit (from the past 240 hour(s)).   Labs: Basic Metabolic Panel:  Recent Labs Lab 05/06/14 1823 05/07/14 0203  NA 141 140  K 4.5 3.8  CL 102 100  CO2 25 28  GLUCOSE 90 88  BUN 10 10  CREATININE 1.23 1.18  CALCIUM 9.5 9.6   Liver Function Tests:  Recent Labs Lab 05/06/14 1823 05/07/14 0203  AST 70* 57*  ALT 90* 84*  ALKPHOS 52 61  BILITOT 0.7 0.6  PROT 6.5 6.7  ALBUMIN 3.7 3.8   No results for input(s): LIPASE, AMYLASE in the last 168 hours. No results for input(s): AMMONIA in the last 168 hours. CBC:  Recent Labs Lab 05/06/14 1823 05/07/14 0203 05/08/14 0515  WBC 4.4 5.7 4.7  NEUTROABS 2.5  --   --   HGB 14.5 14.4 13.7  HCT 42.3 42.8 40.8  MCV 93.2 93.7 92.3  PLT 156 161 166   Cardiac Enzymes:  Recent Labs Lab 05/07/14 0203  TROPONINI <0.30   BNP: BNP (last 3 results) No results for input(s): PROBNP in the last 8760 hours. CBG: No results for input(s): GLUCAP in the last 168 hours.      Signed:  Vernell Leep, MD, FACP, FHM. [facility] Pager [phone]  If 7PM-7AM, please contact night-coverage [email or web address] Password [email or web address] 05/08/2014, 1:41 PM

## 2014-05-08 NOTE — Care Management Note (Signed)
CARE MANAGEMENT NOTE 05/08/2014  Patient:  Nicholas [name], Nicholas [name]   Account Number:  [id number]  Date Initiated:  05/08/2014  Documentation initiated by:  Marney Doctor  Subjective/Objective Assessment:   53 yo admitted with acute pulmonary embolism     Action/Plan:   From home with wife   Anticipated DC Date:  05/08/2014   Anticipated DC Plan:        [place]  CM consult  PCP issues  Medication Assistance      Choice offered to / List presented to:             Status of service:  Completed, signed off Medicare Important Message given?   (If response is "NO", the following Medicare IM given date fields will be blank) Date Medicare IM given:   Medicare IM given by:   Date Additional Medicare IM given:   Additional Medicare IM given by:    Discharge Disposition:    Per UR Regulation:  Reviewed for med. necessity/level of care/duration of stay  If discussed at [place] of Stay Meetings, dates discussed:    Comments:  05/08/14 Marney Doctor RN,BSN,NCM [phone] Chart reviewed.  Pt to dc home on Xarelto.  Pt given Xarelto education brochure and Xarelto card.  Pts wife has been calling around for a PCP.  This CM gave pt a list of [employer] providers in [place] where pt lives.  Pt and wife encouraged to call down the list and find someone asap.  Pt and wife agree to do this and are appreciative of CM assistance.

## 2014-06-24 ENCOUNTER — Ambulatory Visit: Payer: BLUE CROSS/BLUE SHIELD | Attending: Orthopedic Surgery | Admitting: Physical Therapy

## 2014-06-24 DIAGNOSIS — Z4789 Encounter for other orthopedic aftercare: Secondary | ICD-10-CM | POA: Insufficient documentation

## 2014-06-24 DIAGNOSIS — M545 Low back pain: Secondary | ICD-10-CM | POA: Insufficient documentation

## 2014-06-26 ENCOUNTER — Ambulatory Visit: Payer: BLUE CROSS/BLUE SHIELD | Admitting: *Deleted

## 2014-06-26 DIAGNOSIS — Z4789 Encounter for other orthopedic aftercare: Secondary | ICD-10-CM | POA: Diagnosis not present

## 2014-07-01 ENCOUNTER — Ambulatory Visit: Payer: BLUE CROSS/BLUE SHIELD | Admitting: *Deleted

## 2014-07-01 DIAGNOSIS — Z4789 Encounter for other orthopedic aftercare: Secondary | ICD-10-CM | POA: Diagnosis not present

## 2014-07-03 ENCOUNTER — Ambulatory Visit: Payer: BLUE CROSS/BLUE SHIELD | Admitting: *Deleted

## 2014-07-03 DIAGNOSIS — Z4789 Encounter for other orthopedic aftercare: Secondary | ICD-10-CM | POA: Diagnosis not present

## 2014-07-08 ENCOUNTER — Ambulatory Visit: Payer: BLUE CROSS/BLUE SHIELD | Attending: Orthopedic Surgery | Admitting: *Deleted

## 2014-07-08 DIAGNOSIS — M545 Low back pain: Secondary | ICD-10-CM | POA: Diagnosis not present

## 2014-07-08 DIAGNOSIS — Z4789 Encounter for other orthopedic aftercare: Secondary | ICD-10-CM | POA: Diagnosis not present

## 2014-07-10 ENCOUNTER — Ambulatory Visit: Payer: BLUE CROSS/BLUE SHIELD | Admitting: *Deleted

## 2014-07-10 DIAGNOSIS — Z4789 Encounter for other orthopedic aftercare: Secondary | ICD-10-CM | POA: Diagnosis not present

## 2014-07-15 ENCOUNTER — Ambulatory Visit: Payer: BLUE CROSS/BLUE SHIELD | Admitting: Physical Therapy

## 2014-07-15 DIAGNOSIS — Z4789 Encounter for other orthopedic aftercare: Secondary | ICD-10-CM | POA: Diagnosis not present

## 2014-07-18 ENCOUNTER — Ambulatory Visit: Payer: BLUE CROSS/BLUE SHIELD | Admitting: Physical Therapy

## 2014-07-18 DIAGNOSIS — Z4789 Encounter for other orthopedic aftercare: Secondary | ICD-10-CM | POA: Diagnosis not present

## 2014-07-21 ENCOUNTER — Encounter: Payer: BLUE CROSS/BLUE SHIELD | Admitting: Physical Therapy

## 2014-07-31 ENCOUNTER — Encounter: Payer: Self-pay | Admitting: *Deleted

## 2014-07-31 ENCOUNTER — Ambulatory Visit: Payer: BLUE CROSS/BLUE SHIELD | Admitting: *Deleted

## 2014-07-31 DIAGNOSIS — M5441 Lumbago with sciatica, right side: Secondary | ICD-10-CM

## 2014-07-31 DIAGNOSIS — Z4789 Encounter for other orthopedic aftercare: Secondary | ICD-10-CM | POA: Diagnosis not present

## 2014-07-31 NOTE — Therapy (Signed)
[facility] [place], [place], [address] Phone: [phone]   Fax:  [phone]  Physical Therapy Treatment  Patient Details  Name: Nicholas [name] MRN: [id number] Date of Birth: [birth year] Referring Provider:  Melina Schools, MD  Encounter Date: 07/31/2014      PT End of Session - 07/31/14 1740    Visit Number 9   Number of Visits 12   Date for PT Re-Evaluation 07/22/14   PT Start Time 4481   PT Stop Time 1700   PT Time Calculation (min) 57 min      Past Medical History  Diagnosis Date  . Hypertension   . GERD (gastroesophageal reflux disease)   . Arthritis     "my whole body"  . Chronic back pain     "mainly lower; some in top too" (03/26/2014)  . History of gout     Past Surgical History  Procedure Laterality Date  . Carpal tunnel release Bilateral 2012  . Lumbar laminectomy/decompression microdiscectomy  03/26/2014    L3-S1  . Inguinal hernia repair Right 2000's  . Back surgery    . Decompressive lumbar laminectomy level 3 N/A 03/26/2014    Procedure: L3-S1 DECOMPRESSION;  Surgeon: Melina Schools, MD;  Location: [facility];  Service: Orthopedics;  Laterality: N/A;    There were no vitals taken for this visit.  Visit Diagnosis:  lumbago      Subjective Assessment - 07/31/14 1643    Symptoms doing better overall. Pain is less. Daily ADLs are getting easier   Limitations Sitting;Lifting;Standing;House hold activities   Currently in Pain? Yes   Pain Score 3    Pain Location Back   Pain Orientation Right;Mid;Left   Pain Descriptors / Indicators Aching;Dull;Tender;Sore;Shooting   Pain Radiating Towards Great Toe numbness still RT side   Aggravating Factors  ADLs   Pain Relieving Factors Lying down,ice                    [facility] Adult PT Treatment/Exercise - 07/31/14 0001    Exercises   Exercises Lumbar   Lumbar Exercises: Aerobic   Stationary Bike Nustep Level 6-7,focus on posture with Draw-in  x49mn   Elliptical Level 5,Ramp 5, x187m   Lumbar Exercises: Standing   Other Standing Lumbar Exercises XTS, pink cord, ROWS-scapular retraction 3x20    Modalities   Modalities Ultrasound   Ultrasound   Ultrasound Location LB paras   Ultrasound Parameters 1.5 w/cm sq. x 11 min with pt side lying                     PT Long Term Goals - 07/31/14 1745    PT LONG TERM GOAL #1   Title demonstrate or verbalize techniques to reduce the risk of re-injury to include info on posture   Status Achieved  met 07-01-2014   PT LONG TERM GOAL #2   Title Be independent with advanced HEP   Status On-going   PT LONG TERM GOAL #3   Title Perform ADLs with pain not >2-3/10   Status On-going   PT LONG TERM GOAL #4   Title increase RT ankle DF strength to 5/5   Status On-going               Plan - 07/31/14 1742    Clinical Impression Statement Pt continues to progress and tolerate increased activities in clinic   PT Frequency 3x / week   PT Duration 4 weeks   PT  Treatment/Interventions Moist Heat;Therapeutic activities;Patient/family education;Therapeutic exercise;Cryotherapy;Electrical Stimulation;Balance training;Ultrasound   PT Next Visit Plan Cont with PT to increase core strength and improve function        Problem List Patient Active Problem List   Diagnosis Date Noted  . PE (pulmonary embolism) 05/06/2014  . Acute pulmonary embolism 05/06/2014  . Hx of decompressive lumbar laminectomy 05/06/2014  . Essential hypertension 05/06/2014  . Gout flare 05/06/2014  . GERD (gastroesophageal reflux disease) 05/06/2014  . Lumbar stenosis 03/26/2014    APPLEGATE, [place] MPT 07/31/2014, 6:03 PM  [facility] [address] [place], [place], [address] Phone: [phone]   Fax:  [phone]

## 2014-08-07 ENCOUNTER — Encounter: Payer: BLUE CROSS/BLUE SHIELD | Admitting: Physical Therapy

## 2014-08-12 ENCOUNTER — Ambulatory Visit: Payer: BLUE CROSS/BLUE SHIELD | Attending: Orthopedic Surgery | Admitting: Physical Therapy

## 2014-08-12 ENCOUNTER — Encounter: Payer: Self-pay | Admitting: Physical Therapy

## 2014-08-12 DIAGNOSIS — M5441 Lumbago with sciatica, right side: Secondary | ICD-10-CM

## 2014-08-12 DIAGNOSIS — Z4789 Encounter for other orthopedic aftercare: Secondary | ICD-10-CM | POA: Insufficient documentation

## 2014-08-12 DIAGNOSIS — M545 Low back pain: Secondary | ICD-10-CM | POA: Insufficient documentation

## 2014-08-12 NOTE — Therapy (Signed)
[facility] [place], [place], [address] Phone: [phone]   Fax:  [phone]  Physical Therapy Treatment  Patient Details  Name: Nicholas [name] MRN: [id number] Date of Birth: 1961-05-05 Referring Provider:  Melina Schools, MD  Encounter Date: 08/12/2014      PT End of Session - 08/12/14 1412    Visit Number 10   Number of Visits 12   Date for PT Re-Evaluation 07/22/14   PT Start Time 1751   PT Stop Time 1412   PT Time Calculation (min) 54 min   Activity Tolerance Patient tolerated treatment well   Behavior During Therapy [facility] for tasks assessed/performed      Past Medical History  Diagnosis Date  . Hypertension   . GERD (gastroesophageal reflux disease)   . Arthritis     "my whole body"  . Chronic back pain     "mainly lower; some in top too" (03/26/2014)  . History of gout     Past Surgical History  Procedure Laterality Date  . Carpal tunnel release Bilateral 2012  . Lumbar laminectomy/decompression microdiscectomy  03/26/2014    L3-S1  . Inguinal hernia repair Right 2000's  . Back surgery    . Decompressive lumbar laminectomy level 3 N/A 03/26/2014    Procedure: L3-S1 DECOMPRESSION;  Surgeon: Melina Schools, MD;  Location: [facility];  Service: Orthopedics;  Laterality: N/A;    There were no vitals taken for this visit.  Visit Diagnosis:  lumbago      Subjective Assessment - 08/12/14 1320    Symptoms tolerated tx well last visit   Limitations Sitting;Lifting;Standing;House hold activities   Currently in Pain? Yes   Pain Score 2    Pain Location Back   Pain Orientation Right;Mid;Left   Pain Descriptors / Indicators Sore   Aggravating Factors  a lot of standing   Pain Relieving Factors rest          [facility] PT Assessment - 08/12/14 0001    ROM / Strength   AROM / PROM / Strength Strength   Strength   Overall Strength Deficits   Strength Assessment Site Ankle   Right/Left Ankle Right   Right  Ankle Dorsiflexion 4/5   Right Ankle Plantar Flexion 4/5   Right Ankle Inversion 4+/5   Right Ankle Eversion 4/5                  [facility] Adult PT Treatment/Exercise - 08/12/14 0001    Exercises   Exercises Lumbar   Lumbar Exercises: Aerobic   Stationary Bike Nustep Level 6-7,focus on posture with Draw-in x38min   Elliptical Level 5,Ramp 5, x54min   Lumbar Exercises: Standing   Other Standing Lumbar Exercises XTS, pink cord, ROWS-scapular retraction 3x20    Modalities   Modalities Ultrasound   Ultrasound   Ultrasound Location low back paraspinals   Ultrasound Parameters --  1.5w/cm2/100%/45mhz x 10   Ultrasound Goals Pain                PT Education - 08/12/14 1351    Education provided Yes   Education Details HEP/core   Person(s) Educated Patient   Methods Explanation;Demonstration;Handout   Comprehension Verbalized understanding;Returned demonstration             PT Long Term Goals - 08/12/14 1417    PT LONG TERM GOAL #1   Title demonstrate or verbalize techniques to reduce the risk of re-injury to include info on posture   Status Achieved  PT LONG TERM GOAL #2   Title Be independent with advanced HEP   Status Achieved   PT LONG TERM GOAL #3   Title Perform ADLs with pain not >2-3/10   Status On-going   PT LONG TERM GOAL #4   Title increase RT ankle DF strength to 5/5   Status On-going               Plan - 08/12/14 1414    Clinical Impression Statement pt continues to progress with all activities. pt is independent with HEP and understands posture awareness techniques. pt unable to perform normal ADL's at this time due to pain increase and continues to have numbness symptoms in Rt toe   PT Frequency 3x / week   PT Duration 4 weeks   PT Treatment/Interventions Moist Heat;Therapeutic activities;Patient/family education;Therapeutic exercise;Cryotherapy;Electrical Stimulation;Balance training;Ultrasound   PT Next Visit Plan cont with POC  for core strength, add ankle strength ex's/2 visits then pt would like to join [employer] (MD in 2 weeks)   Consulted and Agree with Plan of Care Patient        Problem List Patient Active Problem List   Diagnosis Date Noted  . PE (pulmonary embolism) 05/06/2014  . Acute pulmonary embolism 05/06/2014  . Hx of decompressive lumbar laminectomy 05/06/2014  . Essential hypertension 05/06/2014  . Gout flare 05/06/2014  . GERD (gastroesophageal reflux disease) 05/06/2014  . Lumbar stenosis 03/26/2014    Venise Ellingwood P, PTA 08/12/2014, 2:19 PM  [facility] [place], [place], [address] Phone: [phone]   Fax:  [phone]

## 2014-08-12 NOTE — Patient Instructions (Signed)
Pelvic Tilt: Posterior - Legs Bent (Supine)   Tighten stomach and flatten back by rolling pelvis down. Hold _10___ seconds. Relax. Repeat _10-30___ times per set. Do __2__ sets per session. Do _2___ sessions per day.  http://orth.exer.us/202   Copyright  VHI. All rights reserved.  Bridging   Slowly raise buttocks from floor, keeping stomach tight. Repeat _10___ times per set. Do __2__ sets per session. Do __2__ sessions per day.  http://orth.exer.us/1096   Copyright  VHI. All rights reserved.  Straight Leg Raise   Tighten stomach and slowly raise locked right leg __4__ inches from floor. Repeat __10-30__ times per set. Do __2__ sets per session. Do __2__ sessions per day.  http://orth.exer.us/1102   Copyright  VHI. All rights reserved.  Scapular Retraction: Bilateral   Facing anchor, pull arms back, bringing shoulder blades together. Repeat _30___ times per set. Do __2-3__ sets per session. Do _2___ sessions per day.  http://orth.exer.us/176   Copyright  VHI. All rights reserved.

## 2014-08-14 ENCOUNTER — Encounter: Payer: Self-pay | Admitting: *Deleted

## 2014-08-14 ENCOUNTER — Ambulatory Visit: Payer: BLUE CROSS/BLUE SHIELD | Admitting: *Deleted

## 2014-08-14 DIAGNOSIS — Z4789 Encounter for other orthopedic aftercare: Secondary | ICD-10-CM | POA: Diagnosis not present

## 2014-08-14 DIAGNOSIS — M5441 Lumbago with sciatica, right side: Secondary | ICD-10-CM

## 2014-08-14 NOTE — Therapy (Signed)
[facility] [place], [place], [address] Phone: [phone]   Fax:  [phone]  Physical Therapy Treatment  Patient Details  Name: Nicholas [name] MRN: [id number] Date of Birth: 1960-06-15 Referring Provider:  Melina Schools, MD  Encounter Date: 08/14/2014      PT End of Session - 08/14/14 1735    Visit Number 11   Number of Visits 12   Date for PT Re-Evaluation 07/22/14   PT Start Time 1600   PT Stop Time 8182   PT Time Calculation (min) 58 min      Past Medical History  Diagnosis Date  . Hypertension   . GERD (gastroesophageal reflux disease)   . Arthritis     "my whole body"  . Chronic back pain     "mainly lower; some in top too" (03/26/2014)  . History of gout     Past Surgical History  Procedure Laterality Date  . Carpal tunnel release Bilateral 2012  . Lumbar laminectomy/decompression microdiscectomy  03/26/2014    L3-S1  . Inguinal hernia repair Right 2000's  . Back surgery    . Decompressive lumbar laminectomy level 3 N/A 03/26/2014    Procedure: L3-S1 DECOMPRESSION;  Surgeon: Melina Schools, MD;  Location: [facility];  Service: Orthopedics;  Laterality: N/A;    There were no vitals filed for this visit.  Visit Diagnosis:  lumbago      Subjective Assessment - 08/14/14 1622    Symptoms tolerated tx well last visit   Limitations Sitting;Lifting;Standing;House hold activities   How long can you stand comfortably? about 2-3 hrs then it starts aching. Pain goes up to 7/10   Pain Score 3    Pain Location Back   Pain Orientation Mid;Left   Pain Descriptors / Indicators Dull;Aching   Pain Radiating Towards RT great toe numbness   Aggravating Factors  Long standing                       [facility] Adult PT Treatment/Exercise - 08/14/14 0001    Lumbar Exercises: Aerobic   Stationary Bike Nustep Level 6-7,focus on posture with Draw-in x90min   Elliptical Level 5,Ramp 5, x64min   Lumbar  Exercises: Standing   Other Standing Lumbar Exercises XTS, pink cord, ROWS-scapular retraction 3x20    Ultrasound   Ultrasound Location LB paras   Ultrasound Parameters 1.5w/cm2 x60min with pt LT sidelying   Ultrasound Goals Pain                     PT Long Term Goals - 08/12/14 1417    PT LONG TERM GOAL #1   Title demonstrate or verbalize techniques to reduce the risk of re-injury to include info on posture   Status Achieved   PT LONG TERM GOAL #2   Title Be independent with advanced HEP   Status Achieved   PT LONG TERM GOAL #3   Title Perform ADLs with pain not >2-3/10   Status On-going   PT LONG TERM GOAL #4   Title increase RT ankle DF strength to 5/5   Status On-going               Plan - 08/14/14 1738    Clinical Impression Statement Did well with Rx without increased pain   PT Frequency 3x / week   PT Duration 4 weeks   PT Treatment/Interventions Moist Heat;Therapeutic activities;Patient/family education;Therapeutic exercise;Cryotherapy;Electrical Stimulation;Balance training;Ultrasound   PT Next Visit Plan cont with  POC for core strength, add ankle strength ex's/ 1 visit then pt would like to join [employer] (MD in 2 weeks)   Consulted and Agree with Plan of Care Patient        Problem List Patient Active Problem List   Diagnosis Date Noted  . PE (pulmonary embolism) 05/06/2014  . Acute pulmonary embolism 05/06/2014  . Hx of decompressive lumbar laminectomy 05/06/2014  . Essential hypertension 05/06/2014  . Gout flare 05/06/2014  . GERD (gastroesophageal reflux disease) 05/06/2014  . Lumbar stenosis 03/26/2014    RAMSEUR,CHRIS PTA 08/14/2014, 5:43 PM  [facility] [place], [place], [address] Phone: [phone]   Fax:  [phone]

## 2014-08-19 ENCOUNTER — Encounter: Payer: Self-pay | Admitting: Physical Therapy

## 2014-08-19 ENCOUNTER — Ambulatory Visit: Payer: BLUE CROSS/BLUE SHIELD | Admitting: Physical Therapy

## 2014-08-19 DIAGNOSIS — M5441 Lumbago with sciatica, right side: Secondary | ICD-10-CM

## 2014-08-19 DIAGNOSIS — Z4789 Encounter for other orthopedic aftercare: Secondary | ICD-10-CM | POA: Diagnosis not present

## 2014-08-19 NOTE — Therapy (Addendum)
[facility] [place], [place], [address] Phone: [phone]   Fax:  [phone]  Physical Therapy Treatment  Patient Details  Name: Nicholas [name] MRN: [id number] Date of Birth: [birth year] Referring Provider:  Melina Schools, MD  Encounter Date: 08/19/2014      PT End of Session - 08/19/14 1527    Visit Number 12   Number of Visits 12   PT Start Time 4628   PT Stop Time 1620   PT Time Calculation (min) 66 min   Activity Tolerance Patient tolerated treatment well   Behavior During Therapy [facility] for tasks assessed/performed      Past Medical History  Diagnosis Date  . Hypertension   . GERD (gastroesophageal reflux disease)   . Arthritis     "my whole body"  . Chronic back pain     "mainly lower; some in top too" (03/26/2014)  . History of gout     Past Surgical History  Procedure Laterality Date  . Carpal tunnel release Bilateral 2012  . Lumbar laminectomy/decompression microdiscectomy  03/26/2014    L3-S1  . Inguinal hernia repair Right 2000's  . Back surgery    . Decompressive lumbar laminectomy level 3 N/A 03/26/2014    Procedure: L3-S1 DECOMPRESSION;  Surgeon: Melina Schools, MD;  Location: [facility];  Service: Orthopedics;  Laterality: N/A;    There were no vitals filed for this visit.  Visit Diagnosis:  lumbago      Subjective Assessment - 08/19/14 1515    Symptoms Patient tolerated last treatment well per patient report. Patient denies pain and soreness today. Stated that he hasn't done activities today except for HEP. Patient states that he feels a 60% improvement and has indicated interest in the facility's self- directed gym program.    Limitations Sitting;Lifting;Standing;House hold activities   How long can you stand comfortably? about 2-3 hrs then it starts aching. Pain goes up to 7/10   Currently in Pain? No/denies   Pain Radiating Towards Numbness in the R great toe   Aggravating Factors  Long  periods of standing   Pain Relieving Factors Supine rest            [facility] PT Assessment - 08/19/14 0001    ROM / Strength   AROM / PROM / Strength Strength   Strength   Strength Assessment Site Ankle   Right/Left Ankle Right   Right Ankle Dorsiflexion 4+/5   Right Ankle Plantar Flexion 4+/5   Right Ankle Inversion 4+/5   Right Ankle Eversion 4+/5                   [facility] Adult PT Treatment/Exercise - 08/19/14 0001    Exercises   Exercises Lumbar   Lumbar Exercises: Aerobic   Stationary Bike Nustep Level 6-7,focus on posture with Draw-in x54mn   Elliptical Level 5,Ramp 5, x163m   Lumbar Exercises: Standing   Row Strengthening;Both;10 reps;Other (comment)  Pink XTS; 3 sets 10 reps; practicing drawin, proper posture   Shoulder Extension Strengthening;Both;10 reps;Other (comment)  pink XTS; 3 sets 10 reps; practicing drawin, proper posture   Lumbar Exercises: Seated   Other Seated Lumbar Exercises W Back 3 sets 10 reps on blue therapy ball for lumbar stability   Modalities   Modalities Ultrasound   Ultrasound   Ultrasound Location Low back- paraspinals   Ultrasound Parameters 1.5 w/cm2  100%  3m74m 10 minutes   Ultrasound Goals Pain  PT Education - 08/19/14 1600    Education provided Yes   Education Details Patient was given green theraband that is longer for HEP use.   Person(s) Educated Patient   Methods Explanation   Comprehension Verbalized understanding             PT Long Term Goals - 08/19/14 1531    PT LONG TERM GOAL #1   Title demonstrate or verbalize techniques to reduce the risk of re-injury to include info on posture   Status Achieved   PT LONG TERM GOAL #2   Title Be independent with advanced HEP   Status Achieved   PT LONG TERM GOAL #3   Title Perform ADLs with pain not >2-3/10   Status On-going  Patient states that he hasn't been completing household activities yet.   PT LONG TERM GOAL #4   Title increase  RT ankle DF strength to 5/5   Status On-going  R ankle DF/Inv/Ev. 4+/5               Plan - 08/19/14 1627    Clinical Impression Statement Patient tolerated treatment well and denied pain at the end of the session. Did not complain of pain during exercises. Patient progressing per self reports and with goals. Painfree ADL and R ankle dorsiflexion strength on-going at this time. Required multimodal cueing during rows, shoulder extensions, W back for technique of drawin and posture but patient corrected quickly. Normal ultrasound response observed.    PT Frequency 3x / week   PT Duration 4 weeks   PT Treatment/Interventions Moist Heat;Therapeutic activities;Patient/family education;Therapeutic exercise;Cryotherapy;Electrical Stimulation;Balance training;Ultrasound   PT Next Visit Plan Continue with POC with MD consent for core strengthening. MD appointment 08-26-2014. Patient shows interest in joining self directed gym program at facility.   Consulted and Agree with Plan of Care Patient        Problem List Patient Active Problem List   Diagnosis Date Noted  . PE (pulmonary embolism) 05/06/2014  . Acute pulmonary embolism 05/06/2014  . Hx of decompressive lumbar laminectomy 05/06/2014  . Essential hypertension 05/06/2014  . Gout flare 05/06/2014  . GERD (gastroesophageal reflux disease) 05/06/2014  . Lumbar stenosis 03/26/2014   PHYSICAL THERAPY DISCHARGE SUMMARY  Visits from Start of Care: 12.  Current functional level related to goals / functional outcomes: Please see above.   Remaining deficits: Excellent progress.  Continued right ankle weakness.   Education / Equipment: HEP. Plan: Patient agrees to discharge.  Patient goals were partially met. Patient is being discharged due to being pleased with the current functional level.  ?????      Reinhart Saulters, [place] MPT 08/19/2014, 4:53 PM  [facility] [address] [place], [place], [address] Phone: [phone]   Fax:  [phone]

## 2014-08-19 NOTE — Therapy (Signed)
[facility] [place], [place], [address] Phone: [phone]   Fax:  [phone]  Physical Therapy Treatment  Patient Details  Name: Nicholas [name] MRN: [id number] Date of Birth: [birth year] Referring Provider:  Melina Schools, MD  Encounter Date: 08/19/2014      PT End of Session - 08/19/14 1527    Visit Number 12   Number of Visits 12   PT Start Time 4628   PT Stop Time 1620   PT Time Calculation (min) 66 min   Activity Tolerance Patient tolerated treatment well   Behavior During Therapy [facility] for tasks assessed/performed      Past Medical History  Diagnosis Date  . Hypertension   . GERD (gastroesophageal reflux disease)   . Arthritis     "my whole body"  . Chronic back pain     "mainly lower; some in top too" (03/26/2014)  . History of gout     Past Surgical History  Procedure Laterality Date  . Carpal tunnel release Bilateral 2012  . Lumbar laminectomy/decompression microdiscectomy  03/26/2014    L3-S1  . Inguinal hernia repair Right 2000's  . Back surgery    . Decompressive lumbar laminectomy level 3 N/A 03/26/2014    Procedure: L3-S1 DECOMPRESSION;  Surgeon: Melina Schools, MD;  Location: [facility];  Service: Orthopedics;  Laterality: N/A;    There were no vitals filed for this visit.  Visit Diagnosis:  lumbago      Subjective Assessment - 08/19/14 1515    Symptoms Patient tolerated last treatment well per patient report. Patient denies pain and soreness today. Stated that he hasn't done activities today except for HEP. Patient states that he feels a 60% improvement and has indicated interest in the facility's self- directed gym program.    Limitations Sitting;Lifting;Standing;House hold activities   How long can you stand comfortably? about 2-3 hrs then it starts aching. Pain goes up to 7/10   Currently in Pain? No/denies   Pain Radiating Towards Numbness in the R great toe   Aggravating Factors  Long  periods of standing   Pain Relieving Factors Supine rest            [facility] PT Assessment - 08/19/14 0001    ROM / Strength   AROM / PROM / Strength Strength   Strength   Strength Assessment Site Ankle   Right/Left Ankle Right   Right Ankle Dorsiflexion 4+/5   Right Ankle Plantar Flexion 4+/5   Right Ankle Inversion 4+/5   Right Ankle Eversion 4+/5                   [facility] Adult PT Treatment/Exercise - 08/19/14 0001    Exercises   Exercises Lumbar   Lumbar Exercises: Aerobic   Stationary Bike Nustep Level 6-7,focus on posture with Draw-in x54mn   Elliptical Level 5,Ramp 5, x163m   Lumbar Exercises: Standing   Row Strengthening;Both;10 reps;Other (comment)  Pink XTS; 3 sets 10 reps; practicing drawin, proper posture   Shoulder Extension Strengthening;Both;10 reps;Other (comment)  pink XTS; 3 sets 10 reps; practicing drawin, proper posture   Lumbar Exercises: Seated   Other Seated Lumbar Exercises W Back 3 sets 10 reps on blue therapy ball for lumbar stability   Modalities   Modalities Ultrasound   Ultrasound   Ultrasound Location Low back- paraspinals   Ultrasound Parameters 1.5 w/cm2  100%  3m74m 10 minutes   Ultrasound Goals Pain  PT Education - 08/19/14 1600    Education provided Yes   Education Details Patient was given green theraband that is longer for HEP use.   Person(s) Educated Patient   Methods Explanation   Comprehension Verbalized understanding             PT Long Term Goals - 08/19/14 1531    PT LONG TERM GOAL #1   Title demonstrate or verbalize techniques to reduce the risk of re-injury to include info on posture   Status Achieved   PT LONG TERM GOAL #2   Title Be independent with advanced HEP   Status Achieved   PT LONG TERM GOAL #3   Title Perform ADLs with pain not >2-3/10   Status On-going  Patient states that he hasn't been completing household activities yet.   PT LONG TERM GOAL #4   Title increase  RT ankle DF strength to 5/5   Status On-going  R ankle DF/Inv/Ev. 4+/5               Plan - 08/19/14 1627    Clinical Impression Statement Patient tolerated treatment well and denied pain at the end of the session. Did not complain of pain during exercises. Patient progressing per self reports and with goals. Painfree ADL and R ankle dorsiflexion strength on-going at this time. Required multimodal cueing during rows, shoulder extensions, W back for technique of drawin and posture but patient corrected quickly. Normal ultrasound response observed.    PT Frequency 3x / week   PT Duration 4 weeks   PT Treatment/Interventions Moist Heat;Therapeutic activities;Patient/family education;Therapeutic exercise;Cryotherapy;Electrical Stimulation;Balance training;Ultrasound   PT Next Visit Plan Continue with POC with MD consent for core strengthening. MD appointment 08-26-2014. Patient shows interest in joining self directed gym program at facility.   Consulted and Agree with Plan of Care Patient        Problem List Patient Active Problem List   Diagnosis Date Noted  . PE (pulmonary embolism) 05/06/2014  . Acute pulmonary embolism 05/06/2014  . Hx of decompressive lumbar laminectomy 05/06/2014  . Essential hypertension 05/06/2014  . Gout flare 05/06/2014  . GERD (gastroesophageal reflux disease) 05/06/2014  . Lumbar stenosis 03/26/2014    Wynelle Fanny, PTA 08/19/2014, 4:34 PM  [facility] [address] [place], [place], [address] Phone: [phone]   Fax:  [phone]

## 2015-01-05 ENCOUNTER — Other Ambulatory Visit: Payer: Self-pay | Admitting: Orthopedic Surgery

## 2015-01-05 ENCOUNTER — Other Ambulatory Visit (HOSPITAL_COMMUNITY): Payer: Self-pay | Admitting: Orthopedic Surgery

## 2015-01-05 ENCOUNTER — Inpatient Hospital Stay (HOSPITAL_COMMUNITY)
Admission: AD | Admit: 2015-01-05 | Discharge: 2015-01-11 | DRG: 176 | Disposition: A | Discharge status: Home / Self Care | Payer: BLUE CROSS/BLUE SHIELD | Source: Ambulatory Visit | Attending: Critical Care Medicine | Admitting: Critical Care Medicine

## 2015-01-05 ENCOUNTER — Ambulatory Visit (HOSPITAL_COMMUNITY)
Admission: RE | Admit: 2015-01-05 | Discharge: 2015-01-05 | Disposition: A | Discharge status: Home / Self Care | Payer: BLUE CROSS/BLUE SHIELD | Source: Ambulatory Visit | Attending: Cardiology | Admitting: Cardiology

## 2015-01-05 ENCOUNTER — Encounter (HOSPITAL_COMMUNITY): Payer: Self-pay

## 2015-01-05 ENCOUNTER — Other Ambulatory Visit (HOSPITAL_COMMUNITY): Payer: BLUE CROSS/BLUE SHIELD

## 2015-01-05 ENCOUNTER — Ambulatory Visit (HOSPITAL_COMMUNITY)
Admission: RE | Admit: 2015-01-05 | Discharge: 2015-01-05 | Disposition: A | Discharge status: Home / Self Care | Payer: BLUE CROSS/BLUE SHIELD | Source: Ambulatory Visit | Attending: Orthopedic Surgery | Admitting: Orthopedic Surgery

## 2015-01-05 ENCOUNTER — Inpatient Hospital Stay (HOSPITAL_COMMUNITY): Payer: BLUE CROSS/BLUE SHIELD

## 2015-01-05 DIAGNOSIS — K219 Gastro-esophageal reflux disease without esophagitis: Secondary | ICD-10-CM | POA: Diagnosis not present

## 2015-01-05 DIAGNOSIS — Z86711 Personal history of pulmonary embolism: Secondary | ICD-10-CM

## 2015-01-05 DIAGNOSIS — R609 Edema, unspecified: Secondary | ICD-10-CM

## 2015-01-05 DIAGNOSIS — K5900 Constipation, unspecified: Secondary | ICD-10-CM | POA: Diagnosis not present

## 2015-01-05 DIAGNOSIS — I82412 Acute embolism and thrombosis of left femoral vein: Secondary | ICD-10-CM | POA: Diagnosis present

## 2015-01-05 DIAGNOSIS — Z86718 Personal history of other venous thrombosis and embolism: Secondary | ICD-10-CM

## 2015-01-05 DIAGNOSIS — I2699 Other pulmonary embolism without acute cor pulmonale: Principal | ICD-10-CM | POA: Diagnosis present

## 2015-01-05 DIAGNOSIS — G8929 Other chronic pain: Secondary | ICD-10-CM | POA: Diagnosis present

## 2015-01-05 DIAGNOSIS — I10 Essential (primary) hypertension: Secondary | ICD-10-CM | POA: Diagnosis not present

## 2015-01-05 DIAGNOSIS — Z4789 Encounter for other orthopedic aftercare: Secondary | ICD-10-CM

## 2015-01-05 DIAGNOSIS — R519 Headache, unspecified: Secondary | ICD-10-CM

## 2015-01-05 DIAGNOSIS — I82402 Acute embolism and thrombosis of unspecified deep veins of left lower extremity: Secondary | ICD-10-CM | POA: Diagnosis not present

## 2015-01-05 DIAGNOSIS — M109 Gout, unspecified: Secondary | ICD-10-CM

## 2015-01-05 DIAGNOSIS — Z7901 Long term (current) use of anticoagulants: Secondary | ICD-10-CM

## 2015-01-05 DIAGNOSIS — R739 Hyperglycemia, unspecified: Secondary | ICD-10-CM | POA: Diagnosis present

## 2015-01-05 DIAGNOSIS — Z79899 Other long term (current) drug therapy: Secondary | ICD-10-CM

## 2015-01-05 DIAGNOSIS — I82409 Acute embolism and thrombosis of unspecified deep veins of unspecified lower extremity: Secondary | ICD-10-CM

## 2015-01-05 DIAGNOSIS — R51 Headache: Secondary | ICD-10-CM

## 2015-01-05 DIAGNOSIS — M549 Dorsalgia, unspecified: Secondary | ICD-10-CM | POA: Diagnosis present

## 2015-01-05 DIAGNOSIS — I82432 Acute embolism and thrombosis of left popliteal vein: Secondary | ICD-10-CM | POA: Diagnosis present

## 2015-01-05 DIAGNOSIS — I82442 Acute embolism and thrombosis of left tibial vein: Secondary | ICD-10-CM | POA: Diagnosis present

## 2015-01-05 HISTORY — PX: OTHER SURGICAL HISTORY: SHX169

## 2015-01-05 LAB — GLUCOSE, CAPILLARY
GLUCOSE-CAPILLARY: 77 mg/dL (ref 65–99)
GLUCOSE-CAPILLARY: 98 mg/dL (ref 65–99)

## 2015-01-05 LAB — CBC WITH DIFFERENTIAL/PLATELET
BASOS ABS: 0 10*3/uL (ref 0.0–0.1)
BASOS PCT: 0 % (ref 0–1)
Eosinophils Absolute: 0.4 10*3/uL (ref 0.0–0.7)
Eosinophils Relative: 6 % — ABNORMAL HIGH (ref 0–5)
HCT: 44.2 % (ref 39.0–52.0)
Hemoglobin: 14.6 g/dL (ref 13.0–17.0)
LYMPHS PCT: 22 % (ref 12–46)
Lymphs Abs: 1.5 10*3/uL (ref 0.7–4.0)
MCH: 30.5 pg (ref 26.0–34.0)
MCHC: 33 g/dL (ref 30.0–36.0)
MCV: 92.5 fL (ref 78.0–100.0)
MONOS PCT: 12 % (ref 3–12)
Monocytes Absolute: 0.8 10*3/uL (ref 0.1–1.0)
NEUTROS PCT: 60 % (ref 43–77)
Neutro Abs: 4 10*3/uL (ref 1.7–7.7)
Platelets: 122 10*3/uL — ABNORMAL LOW (ref 150–400)
RBC: 4.78 MIL/uL (ref 4.22–5.81)
RDW: 16 % — ABNORMAL HIGH (ref 11.5–15.5)
WBC: 6.7 10*3/uL (ref 4.0–10.5)

## 2015-01-05 LAB — COMPREHENSIVE METABOLIC PANEL
ALBUMIN: 3.4 g/dL — AB (ref 3.5–5.0)
ALT: 41 U/L (ref 17–63)
AST: 35 U/L (ref 15–41)
Alkaline Phosphatase: 42 U/L (ref 38–126)
Anion gap: 10 (ref 5–15)
BILIRUBIN TOTAL: 1.1 mg/dL (ref 0.3–1.2)
BUN: 13 mg/dL (ref 6–20)
CALCIUM: 9.1 mg/dL (ref 8.9–10.3)
CO2: 26 mmol/L (ref 22–32)
Chloride: 104 mmol/L (ref 101–111)
Creatinine, Ser: 1.14 mg/dL (ref 0.61–1.24)
GFR calc Af Amer: >60 mL/min (ref >60)
GFR calc non Af Amer: >60 mL/min (ref >60)
GLUCOSE: 97 mg/dL (ref 65–99)
POTASSIUM: 4.3 mmol/L (ref 3.5–5.1)
SODIUM: 140 mmol/L (ref 135–145)
TOTAL PROTEIN: 6.5 g/dL (ref 6.5–8.1)

## 2015-01-05 LAB — APTT: APTT: 29 s (ref 24–37)

## 2015-01-05 LAB — PROTIME-INR
INR: 1.19 (ref 0.00–1.49)
Prothrombin Time: 15.3 seconds — ABNORMAL HIGH (ref 11.6–15.2)

## 2015-01-05 LAB — TROPONIN I

## 2015-01-05 LAB — MRSA PCR SCREENING: MRSA BY PCR: NEGATIVE

## 2015-01-05 MED ORDER — SODIUM CHLORIDE 0.9 % IJ SOLN
3.0000 mL | INTRAMUSCULAR | Status: DC | PRN
  Administered 2015-01-09: 3 mL via INTRAVENOUS
  Filled 2015-01-05: qty 3

## 2015-01-05 MED ORDER — MORPHINE SULFATE 2 MG/ML IJ SOLN: 2.0000 mg | INTRAMUSCULAR | Status: DC | PRN

## 2015-01-05 MED ORDER — RIVAROXABAN 15 MG PO TABS: 15.0000 mg | ORAL_TABLET | Freq: Two times a day (BID) | ORAL | Status: DC

## 2015-01-05 MED ORDER — SODIUM CHLORIDE 0.9 % IJ SOLN
3.0000 mL | Freq: Two times a day (BID) | INTRAMUSCULAR | Status: DC
  Administered 2015-01-06 – 2015-01-11 (×6): 3 mL via INTRAVENOUS

## 2015-01-05 MED ORDER — SODIUM CHLORIDE 0.9 % IJ SOLN
3.0000 mL | Freq: Two times a day (BID) | INTRAMUSCULAR | Status: DC
  Administered 2015-01-06 – 2015-01-11 (×6): 3 mL via INTRAVENOUS

## 2015-01-05 MED ORDER — ONDANSETRON HCL 4 MG/2ML IJ SOLN
4.0000 mg | Freq: Four times a day (QID) | INTRAMUSCULAR | Status: DC | PRN
  Administered 2015-01-06 – 2015-01-07 (×2): 4 mg via INTRAVENOUS
  Filled 2015-01-05 (×3): qty 2

## 2015-01-05 MED ORDER — HYDRALAZINE HCL 20 MG/ML IJ SOLN: 10.0000 mg | INTRAMUSCULAR | Status: DC | PRN

## 2015-01-05 MED ORDER — HEPARIN (PORCINE) IN NACL 100-0.45 UNIT/ML-% IJ SOLN
900.0000 [IU]/h | INTRAMUSCULAR | Status: DC
  Administered 2015-01-05: 900 [IU]/h via INTRAVENOUS
  Filled 2015-01-05: qty 250

## 2015-01-05 MED ORDER — RIVAROXABAN 20 MG PO TABS: 20.0000 mg | ORAL_TABLET | Freq: Every day | ORAL | Status: DC

## 2015-01-05 MED ORDER — IOHEXOL 350 MG/ML SOLN
100.0000 mL | Freq: Once | INTRAVENOUS | Status: AC | PRN
  Administered 2015-01-05: 100 mL via INTRAVENOUS

## 2015-01-05 MED ORDER — OXYCODONE HCL 5 MG PO TABS
5.0000 mg | ORAL_TABLET | ORAL | Status: DC | PRN
  Administered 2015-01-05 – 2015-01-10 (×9): 5 mg via ORAL
  Filled 2015-01-05 (×9): qty 1

## 2015-01-05 MED ORDER — MORPHINE SULFATE 2 MG/ML IJ SOLN
2.0000 mg | INTRAMUSCULAR | Status: DC | PRN
Start: 2015-01-05 — End: 2015-01-10
  Administered 2015-01-05 – 2015-01-06 (×3): 2 mg via INTRAVENOUS
  Filled 2015-01-05: qty 2
  Filled 2015-01-05 (×3): qty 1

## 2015-01-05 MED ORDER — SODIUM CHLORIDE 0.9 % IV SOLN: 250.0000 mL | INTRAVENOUS | Status: DC | PRN

## 2015-01-05 MED ORDER — ALUM & MAG HYDROXIDE-SIMETH 200-200-20 MG/5ML PO SUSP
30.0000 mL | Freq: Four times a day (QID) | ORAL | Status: DC | PRN
  Filled 2015-01-05: qty 30

## 2015-01-05 MED ORDER — PROBENECID 500 MG PO TABS
500.0000 mg | ORAL_TABLET | Freq: Every day | ORAL | Status: DC
  Administered 2015-01-05 – 2015-01-07 (×3): 500 mg via ORAL
  Filled 2015-01-05 (×4): qty 1

## 2015-01-05 MED ORDER — COLCHICINE 0.6 MG PO TABS
0.6000 mg | ORAL_TABLET | Freq: Every day | ORAL | Status: DC
  Administered 2015-01-05 – 2015-01-07 (×3): 0.6 mg via ORAL
  Filled 2015-01-05 (×4): qty 1

## 2015-01-05 MED ORDER — PANTOPRAZOLE SODIUM 40 MG PO TBEC
40.0000 mg | DELAYED_RELEASE_TABLET | Freq: Every day | ORAL | Status: DC
  Administered 2015-01-05 – 2015-01-07 (×3): 40 mg via ORAL
  Filled 2015-01-05 (×3): qty 1

## 2015-01-05 MED ORDER — ACETAMINOPHEN 650 MG RE SUPP: 650.0000 mg | Freq: Four times a day (QID) | RECTAL | Status: DC | PRN

## 2015-01-05 MED ORDER — ONDANSETRON HCL 4 MG PO TABS
4.0000 mg | ORAL_TABLET | Freq: Four times a day (QID) | ORAL | Status: DC | PRN
  Administered 2015-01-06: 4 mg via ORAL
  Filled 2015-01-05: qty 1

## 2015-01-05 MED ORDER — RIVAROXABAN 15 MG PO TABS
15.0000 mg | ORAL_TABLET | Freq: Two times a day (BID) | ORAL | Status: DC
  Administered 2015-01-05: 15 mg via ORAL
  Filled 2015-01-05: qty 1

## 2015-01-05 MED ORDER — ACETAMINOPHEN 325 MG PO TABS
650.0000 mg | ORAL_TABLET | Freq: Four times a day (QID) | ORAL | Status: DC | PRN
  Administered 2015-01-06 – 2015-01-07 (×4): 650 mg via ORAL
  Filled 2015-01-05 (×4): qty 2

## 2015-01-05 NOTE — Progress Notes (Signed)
Patient's family asking for a second opinion. Dr Joya Gaskins will take over. I will sign off.   Debbe Odea, MD

## 2015-01-05 NOTE — Progress Notes (Signed)
[facility] Progress Note Patient Name: Nicholas [name] DOB: [birth year] MRN: [id number]   Date of Service  01/05/2015  HPI/Events of Note  Pt with pain d/t dvt and pe  eICU Interventions  Morphine ordered for pain      Intervention Category Intermediate Interventions: Pain - evaluation and management  Asencion Noble 01/05/2015, 9:37 PM

## 2015-01-05 NOTE — Progress Notes (Signed)
Echocardiogram 2D Echocardiogram has been performed.  Tresa Res 01/05/2015, 4:33 PM

## 2015-01-05 NOTE — Progress Notes (Signed)
[facility] Pharmacist-Brief Progress Note Patient Name: Nicholas [name] DOB: [birth year] MRN: [id number]   Date of Service  01/05/2015  HPI/Events of Note  Patient with h/o DVT/PE now presents with new PE/DVT after being off Xarelto for 4 weeks  eICU Interventions  Start Xarelto 15 mg PO BID with food for 21 days followed by 20 mg once daily with food   Spoke with Dr. Wynelle Cleveland, placed patient back on Xarelto.   Micajah Dennin L. Nicole Kindred, PharmD Clinical Pharmacy Resident Pager: [phone] 01/05/2015 4:51 PM

## 2015-01-05 NOTE — Consult Note (Signed)
Name: Nicholas [name] MRN: [id number] DOB: 21-Mar-1961    ADMISSION DATE:  01/05/2015 CONSULTATION DATE:  01/05/2015  REFERRING MD :  Wynelle Cleveland  CHIEF COMPLAINT:  SOB, left leg swelling  BRIEF PATIENT DESCRIPTION: 54 y.o. M with hx of PE / DVT (stopped xarelto 4 weeks ago), now admitted with left leg DVT and bilateral PE.  Admitted by [facility] and started on xarelto; however, family wanted second opinion so PCCM asked to see and assume care.  SIGNIFICANT EVENTS  8/1 - admitted with left leg DVT and bilateral PE.  STUDIES:  LE duplex 8/1 >>> acute thrombus distal right CFV, FV, PV, PTV and peroneal veins. CTA chest 8/1 >>> Bilateral PE, R > L.  RV / LV ratio 0.92. Echo 8/1 >>> Hypercoagulable panel 8/1 >>>  HISTORY OF PRESENT ILLNESS:  Nicholas [name] is a 54 y.o. M with PMH as outlined below including provoked PE / DVT after lumbar laminectomy in October 2015.  He was placed on 6 month course of xarelto which he completed 4 weeks ago.  He suddenly developed left leg swelling on Friday 7/29 and on Sunday 7/31, developed new SOB. On Monday 01/05/15, he went to see his orthopedist Dr. Rolena Infante since he does not have a PCP.  He was sent for LE duplex which showed a leftleg DVT.  He was subsequently sent to ED for further eval.  In ED, CTA revealed bilateral PE with RV / LV of 0.92.  He was admitted by [facility] and started back on Xarelto.  Family was concerned whether he needed additional treatment therefore PCCM was consulted / asked to assume care.  He denies any recent travel or periods of prolonged immobilization, hemoptysis, hx malignancy or clotting disorders.  Has never had clotting disorder workup that he knows of.  Last PE / DVT was after lumbar laminectomy and he was on a 6 month course of xarelto.  PAST MEDICAL HISTORY :   has a past medical history of Hypertension; GERD (gastroesophageal reflux disease); Arthritis; Chronic back pain; and History of gout.  has past surgical history that includes Carpal tunnel  release (Bilateral, 2012); Lumbar laminectomy/decompression microdiscectomy (03/26/2014); Inguinal hernia repair (Right, 2000's); Back surgery; and Decompressive lumbar laminectomy level 3 (N/A, 03/26/2014). Prior to Admission medications   Medication Sig Start Date End Date Taking? Authorizing Provider  colchicine 0.6 MG tablet Take 0.6 mg by mouth daily.   Yes Historical Provider, MD  losartan (COZAAR) 25 MG tablet Take 25 mg by mouth daily.   Yes Historical Provider, MD  MITIGARE 0.6 MG CAPS Take 0.6 mg by mouth daily. 12/25/14  Yes Historical Provider, MD  naproxen (NAPROSYN) 375 MG tablet Take 375 mg by mouth 2 (two) times daily as needed. For pain 12/26/14  Yes Historical Provider, MD  omeprazole (PRILOSEC) 20 MG capsule Take 20 mg by mouth daily.   Yes Historical Provider, MD  oxyCODONE (OXY IR/ROXICODONE) 5 MG immediate release tablet Take 5 mg by mouth 3 (three) times daily. 11/25/14  Yes Historical Provider, MD  oxycodone (OXY-IR) 5 MG capsule Take 5 mg by mouth every 6 (six) hours as needed for pain.   Yes Historical Provider, MD  predniSONE (DELTASONE) 5 MG tablet Take 5 mg by mouth daily as needed. For gout flare up 11/19/14  Yes Historical Provider, MD  probenecid (BENEMID) 500 MG tablet Take 500 mg by mouth daily.    Yes Historical Provider, MD  predniSONE (DELTASONE) 10 MG tablet Start on 05/09/14: Take 4 tabs daily  2 days, then 3 tabs daily 2 days, then 2 tabs daily 2 days, then 1 tab daily 2 days, then stop. Patient not taking: Reported on 01/05/2015 05/08/14   [name], MD   No Known Allergies  FAMILY HISTORY:  family history is not on file. SOCIAL HISTORY:  reports that he has quit smoking. His smoking use included Cigarettes. He has a 12 pack-year smoking history. He has quit using smokeless tobacco. His smokeless tobacco use included Snuff and Chew. He reports that he drinks about 10.8 oz of alcohol per week. He reports that he does not use illicit drugs.  REVIEW OF  SYSTEMS:   All negative; except for those that are bolded, which indicate positives.  Constitutional: weight loss, weight gain, night sweats, fevers, chills, fatigue, weakness.  HEENT: headaches, sore throat, sneezing, nasal congestion, post nasal drip, difficulty swallowing, tooth/dental problems, visual complaints, visual changes, ear aches. Neuro: difficulty with speech, weakness, numbness, ataxia. CV:  chest pain, orthopnea, PND, swelling in lower extremities, dizziness, palpitations, syncope.  Resp: cough, hemoptysis, dyspnea, wheezing. GI  heartburn, indigestion, abdominal pain, nausea, vomiting, diarrhea, constipation, change in bowel habits, loss of appetite, hematemesis, melena, hematochezia.  GU: dysuria, change in color of urine, urgency or frequency, flank pain, hematuria. MSK: joint pain or swelling, decreased range of motion, swelling to left lower leg. Psych: change in mood or affect, depression, anxiety, suicidal ideations, homicidal ideations. Skin: rash, itching, bruising.    SUBJECTIVE:  Denies chest pain.  Has SOB but not bad at rest.  In good spirits.  VITAL SIGNS: Temp:  [97.7 F (36.5 C)] 97.7 F (36.5 C) (08/01 1600) Pulse Rate:  [69-82] 71 (08/01 1800) Resp:  [18-24] 24 (08/01 1800) BP: (145-163)/(87-96) 147/87 mmHg (08/01 1800) SpO2:  [95 %-98 %] 96 % (08/01 1800) Weight:  [100.8 kg (222 lb 3.6 oz)] 100.8 kg (222 lb 3.6 oz) (08/01 1405)  PHYSICAL EXAMINATION: General: Adult male, resting in bed, in NAD. Neuro: A&O x 3, non-focal.  HEENT: [place]/AT. PERRL, sclerae anicteric. Cardiovascular: RRR, no M/R/G.  Lungs: Respirations even and unlabored.  CTA bilaterally, No W/R/R. Abdomen: BS x 4, soft, NT/ND.  Musculoskeletal: No gross deformities.  LLE edema > RLE. Skin: Intact, warm, no rashes.    Recent Labs Lab 01/05/15 1509  NA 140  K 4.3  CL 104  CO2 26  BUN 13  CREATININE 1.14  GLUCOSE 97    Recent Labs Lab 01/05/15 1509  HGB 14.6  HCT 44.2   WBC 6.7  PLT 122*   Ct Angio Chest Pe W/cm &/or Wo Cm  01/05/2015   CLINICAL DATA:  Increased seen shortness of breath today. History of prior pulmonary embolism and acute deep venous thrombosis.  EXAM: CT ANGIOGRAPHY CHEST WITH CONTRAST  TECHNIQUE: Multidetector CT imaging of the chest was performed using the standard protocol during bolus administration of intravenous contrast. Multiplanar CT image reconstructions and MIPs were obtained to evaluate the vascular anatomy.  CONTRAST:  133mL OMNIPAQUE IOHEXOL 350 MG/ML SOLN  COMPARISON:  05/06/2014  FINDINGS: Chest wall: No chest wall mass, supraclavicular or axillary lymphadenopathy. The thyroid gland is normal. The bony thorax is intact.  Mediastinum: The heart is normal in size. No pericardial effusion. No mediastinal or hilar mass or adenopathy. The aorta is normal in caliber. No dissection. The branch vessels are patent. The esophagus is grossly normal.  The pulmonary arterial tree is fairly well opacified. There is a large filling defect in the right main pulmonary artery extending  into the right lower lobe pulmonary arteries. Small left-sided pulmonary emboli are also noted. Mild right heart strain is noted with flattening of the left ventricle. The RV LV ratio is 0.92.  Lungs: No acute pulmonary findings. No worrisome pulmonary lesions. Patchy areas of subsegmental atelectasis noted in the right lower lobe.  Upper abdomen: Numerous hepatic cysts are noted. No worrisome lesions.  Review of the MIP images confirms the above findings.  IMPRESSION: 1. Bilateral pulmonary embolism but much more significant on the right side with a large central right-sided PE. Mild right ventricular heart strain with slight flattening of the left ventricle. The RV LV ratio is 0.92. 2. No significant pulmonary findings. 3. Normal thoracic aorta.  These results will be called to the ordering clinician or representative by the Radiologist Assistant, and communication documented  in the PACS or zVision Dashboard.   Electronically Signed   By: Marijo Sanes M.D.   On: 01/05/2015 13:12    ASSESSMENT / PLAN:  Bilateral PE with left leg DVT (RV / LV of 0.92 per CTA) - sPESI class 1 which represents very low risk.  He has hx of prior provoked PE / DVT after lumbar laminectomy for which he was on a 6 month course of xarelto.  I do not feel that this new PE / DVT represents xarelto failure given that he completed his course 4 weeks ago and was completely symptom free up until 3 days prior to admission.  Question whether he has hypercoagulable state / clotting disorder ?.  Regardless, he is stable and has no O2 requirements, hypotension, tachypnea, tachycardia.   Plan: Clinically he is stable and I do not feel that he requires EKOS. Switch from xarelto to heparin gtt. Can transition back to xarelto 01/06/15 (pt and his family prefer xarelto over coumadin). Will likely require life long anticoagulation. F/u on echo results. Hypercoagulable panel sent - f/u on results. Check troponin.  Essential HTN. Plan: Hydralazine PRN for tonight. Can likely resume outpatient losartan in AM 01/06/15. Monitor hemodynamics.  GERD Plan: Continue PPI.  Gout Plan: Continue colchicine and probenecid.   Montey Hora, [facility] Pager: [phone]  or [phone] 01/05/2015, 7:34 PM

## 2015-01-05 NOTE — Consult Note (Addendum)
[facility] Medical Consultation  MASASHI SNOWDON KGM:[id number] DOB: [birth year] DOA: 01/05/2015 PCP: [facility] [place]   Requesting physician: Dr Melina Schools Date of consultation: 01/05/15 Reason for consultation: PE  Impression/Recommendations Principal Problem:   PE (pulmonary embolism) and left-sided DVT -Previous PE/DVT was suspected to be provoked after back surgery and DVT was in his right lower extremity - Per CT scan report that he has a large central right-sided PE-see report mentioned below -Venous duplex preliminary report is as follows: Acute thrombus in the distal common femoral vein into the femoral vein, popliteal vein and the PTV and peroneal veins - no mention of mobile thrombus -Clinically he appears quite stable without hypotension, tachycardia or hypoxia-oxygen level is 97% on room air -We'll obtain baseline lab work and most likely place him on an oral anticoagulant-he and his wife prefer to go back on Xarelto - Addendum: spoke with PETE from ICU and advised to start Xarelto and monitor overnight - have ordered Xarelto -Obtain 2-D echo  - He will need an outpatient referral to hematology to decide if he needs to be on lifelong anticoagulation  -Keep Left leg elevated and keep him on bedrest for tonight -Place teds tomorrow morning  Active Problems:   Essential hypertension - Follow BP and hold antihypertensives for hours to be sure he is not becoming hypotensive    GERD (gastroesophageal reflux disease) -Continue PPI    Gout --Continue colchicine and probenecid  Generalized arthritis -He is asking me for any medications to help treat this-he is unable to take Naprosyn and other NSAIDs due to gastritis of reflux disease-I have recommended that he continue his colchicine which he does admit helps him-if pain is severe he can add tramadol after discussing with his PCP   [facility] will take over as attending on this  patient.  Chief Complaint: direct admit by Dr. Charmayne Sheer for PE/ DVT  HPI:  54 y/o male with PMH of right sided PE provoked after lumbar laminectomy 10/21 /15. He was placed on Xarelto and stopped it after 6 months which was 4 wks ago. He developed left lag pain and swelling 3 days ago and shortness of breath the following day. He does not have a PCP and went to Dr Charmayne Sheer office. He was sent for a venous duplex which showed a DVT in the right leg from the common femoral to the tibial vein. He was then sent to the ER for CT scan of his chest which showed bilateral PE with RV strain. The patient was referred for direct admission and has been admitted by Dr. Rolena Infante. His main complaints are left leg swelling and left leg pain when he sits or stands and shortness of breath when he ambulates. No complaint of chest pain.  Review of Systems  Constitutional: Negative for fever, chills weight loss HENT: Negative for ear pain, nosebleeds, congestion, facial swelling, rhinorrhea, neck pain, neck stiffness and ear discharge.   Respiratory: Negative for cough, wheezing -see history of present illness Cardiovascular: Negative for chest pain, palpitations and leg swelling.  Gastrointestinal: Negative for heartburn, abdominal pain, vomiting, diarrhea or consitpation Genitourinary: Negative for dysuria, urgency, frequency, hematuria Musculoskeletal: States he has a significant amount of diffuse joint pains and back pain Neurological: Negative for dizziness, seizures, syncope, focal weakness,  numbness and headaches. - has chronic numbness or tingling in right first toe since back surgery Hematological: Does not bruise/bleed easily.  Psychiatric/Behavioral: Negative for hallucinations, confusion, dysphoric mood   Past  Medical History  Diagnosis Date  . Hypertension   . GERD (gastroesophageal reflux disease)   . Arthritis     "my whole body"  . Chronic back pain     "mainly lower; some in top too"  (03/26/2014)  . History of gout    Past Surgical History  Procedure Laterality Date  . Carpal tunnel release Bilateral 2012  . Lumbar laminectomy/decompression microdiscectomy  03/26/2014    L3-S1  . Inguinal hernia repair Right 2000's  . Back surgery    . Decompressive lumbar laminectomy level 3 N/A 03/26/2014    Procedure: L3-S1 DECOMPRESSION;  Surgeon: Melina Schools, MD;  Location: [place];  Service: Orthopedics;  Laterality: N/A;   Social History:  reports that he has quit smoking. His smoking use included Cigarettes. He has a 12 pack-year smoking history. He has quit using smokeless tobacco. His smokeless tobacco use included Snuff and Chew. He reports that he drinks about 10.8 oz of alcohol per week. He reports that he does not use illicit drugs.  Works part time in [employer]  No Known Allergies Family history - Mother had colon cancer  Prior to Admission medications   Medication Sig Start Date End Date Taking? Authorizing Provider  oxycodone (OXY-IR) 5 MG capsule Take 5 mg by mouth every 6 (six) hours as needed for pain.   Yes Historical Provider, MD  colchicine 0.6 MG tablet Take 0.6 mg by mouth daily.    Historical Provider, MD  losartan (COZAAR) 25 MG tablet Take 25 mg by mouth daily.    Historical Provider, MD  omeprazole (PRILOSEC) 20 MG capsule Take 20 mg by mouth daily.    Historical Provider, MD  predniSONE (DELTASONE) 10 MG tablet Start on 05/09/14: Take 4 tabs daily 2 days, then 3 tabs daily 2 days, then 2 tabs daily 2 days, then 1 tab daily 2 days, then stop. 05/08/14   [name], MD  probenecid (BENEMID) 500 MG tablet Take 500 mg by mouth daily.     Historical Provider, MD  Tetrahydrozoline HCl (VISINE OP) Place 1 drop into both eyes 3 (three) times daily.    Historical Provider, MD    Physical Exam:  18    Resp Rate               Pulse          73    Pulse    ECG Heart Rate          73    ECG Heart Rate    Rhythm          NSR    Rhythm     BP (cuff)          163/95    BP (cuff)    Position (Orthostatic)          Lying    Position (Orthostatic)    MAP (cuff)          117    MAP (cuff)    GCS          1                Constitutional: Appears well-developed and well-nourished. No distress. HENT: Normocephalic. External right and left ear normal. Oropharynx is clear and moist.  Eyes: Conjunctivae and EOM are normal. PERRLA, no scleral icterus.  Neck: Normal ROM. Neck supple. No JVD. No tracheal deviation. No thyromegaly.  CVS: RRR, S1/S2 +, no murmurs, no gallops, no carotid bruit.  Pulmonary: Effort and breath sounds normal, no stridor, rhonchi, wheezes, rales.  Abdominal: Soft. BS +,  no distension, tenderness, rebound or guarding.  Musculoskeletal: Normal range of motion. Left leg swollen mostly at the calf-no pitting  Neuro: Alert. Normal reflexes, muscle tone coordination. No cranial nerve deficit. Skin: Skin is warm and dry. No rash noted. Not diaphoretic. No erythema. No pallor.  Psychiatric: Normal mood and affect. Behavior, judgment, thought content normal.    Labs on Admission:  Basic Metabolic Panel: No results for input(s): NA, K, CL, CO2, GLUCOSE, BUN, CREATININE, CALCIUM, MG, PHOS in the last 168 hours. Liver Function Tests: No results for input(s): AST, ALT, ALKPHOS, BILITOT, PROT, ALBUMIN in the last 168 hours. No results for input(s): LIPASE, AMYLASE in the last 168 hours. No results for input(s): AMMONIA in the last 168 hours. CBC: No results for input(s): WBC, NEUTROABS, HGB, HCT, MCV, PLT in the last 168 hours. Cardiac Enzymes: No results for input(s): CKTOTAL, CKMB, CKMBINDEX, TROPONINI in the last 168 hours. BNP: Invalid input(s): POCBNP CBG: No results for input(s): GLUCAP in the last 168 hours.  Radiological Exams on Admission: Ct Angio Chest Pe W/cm &/or Wo Cm  01/05/2015   CLINICAL DATA:  Increased seen shortness of breath today. History of prior pulmonary embolism and acute deep venous  thrombosis.  EXAM: CT ANGIOGRAPHY CHEST WITH CONTRAST  TECHNIQUE: Multidetector CT imaging of the chest was performed using the standard protocol during bolus administration of intravenous contrast. Multiplanar CT image reconstructions and MIPs were obtained to evaluate the vascular anatomy.  CONTRAST:  160mL OMNIPAQUE IOHEXOL 350 MG/ML SOLN  COMPARISON:  05/06/2014  FINDINGS: Chest wall: No chest wall mass, supraclavicular or axillary lymphadenopathy. The thyroid gland is normal. The bony thorax is intact.  Mediastinum: The heart is normal in size. No pericardial effusion. No mediastinal or hilar mass or adenopathy. The aorta is normal in caliber. No dissection. The branch vessels are patent. The esophagus is grossly normal.  The pulmonary arterial tree is fairly well opacified. There is a large filling defect in the right main pulmonary artery extending into the right lower lobe pulmonary arteries. Small left-sided pulmonary emboli are also noted. Mild right heart strain is noted with flattening of the left ventricle. The RV LV ratio is 0.92.  Lungs: No acute pulmonary findings. No worrisome pulmonary lesions. Patchy areas of subsegmental atelectasis noted in the right lower lobe.  Upper abdomen: Numerous hepatic cysts are noted. No worrisome lesions.  Review of the MIP images confirms the above findings.  IMPRESSION: 1. Bilateral pulmonary embolism but much more significant on the right side with a large central right-sided PE. Mild right ventricular heart strain with slight flattening of the left ventricle. The RV LV ratio is 0.92. 2. No significant pulmonary findings. 3. Normal thoracic aorta.  These results will be called to the ordering clinician or representative by the Radiologist Assistant, and communication documented in the PACS or zVision Dashboard.   Electronically Signed   By: Marijo Sanes M.D.   On: 01/05/2015 13:12    EKG: Independently reviewed. Pending  Time spent: 60  minutes  Kienan Doublin, MD [facility] To page rounding or on call physician  [email or web address] 01/05/2015, 3:12 PM

## 2015-01-05 NOTE — H&P (Signed)
No PCP Per Patient Chief Complaint: Left LE swelling, SOB History: The patient is a 54 year old male presenting for a post-operative visit. Patient is 9 months (and 1/2) postop following their lumbar and He returns early with left lower leg pain and swelling and began with Shortness of Breath yesterday.  Post-op course complicated by DVT/PE.  Patient recently ended 6 month course of Xarelto 4 weeks ago.Overall the patient feels that they are not doing well (continues with back pain and now left lower leg and calf pain with redness and swelling). Post operative pain has been he rates his back pain at 4/10 and the leg at 8/10. The patient does report pain, swelling and pain in the calf (left), while the patient does not report drainage, fever, chills or redness No pain medications are being taken .   Past Medical History  Diagnosis Date  . Hypertension   . GERD (gastroesophageal reflux disease)   . Arthritis     "my whole body"  . Chronic back pain     "mainly lower; some in top too" (03/26/2014)  . History of gout     No Known Allergies  No current facility-administered medications on file prior to encounter.   Current Outpatient Prescriptions on File Prior to Encounter  Medication Sig Dispense Refill  . colchicine 0.6 MG tablet Take 0.6 mg by mouth daily.    . diazepam (VALIUM) 10 MG tablet Take 1 tablet by mouth at bedtime as needed. For sleep    . losartan (COZAAR) 25 MG tablet Take 25 mg by mouth daily.    . methocarbamol (ROBAXIN) 500 MG tablet Take 1 tablet (500 mg total) by mouth every 6 (six) hours as needed for muscle spasms. 60 tablet 0  . omeprazole (PRILOSEC) 20 MG capsule Take 20 mg by mouth daily.    . ondansetron (ZOFRAN ODT) 4 MG disintegrating tablet Take 1 tablet (4 mg total) by mouth every 8 (eight) hours as needed. (Patient taking differently: Take 4 mg by mouth every 8 (eight) hours as needed for nausea or vomiting. ) 40 tablet 0  . oxyCODONE (ROXICODONE) 15 MG  immediate release tablet Take 1 tablet (15 mg total) by mouth every 6 (six) hours as needed for pain. 60 tablet 0  . predniSONE (DELTASONE) 10 MG tablet Start on 05/09/14: Take 4 tabs daily 2 days, then 3 tabs daily 2 days, then 2 tabs daily 2 days, then 1 tab daily 2 days, then stop. 20 tablet 20  . probenecid (BENEMID) 500 MG tablet Take 500 mg by mouth daily.     . Rivaroxaban (XARELTO STARTER PACK) 15 & 20 MG TBPK Take as directed on package: Start with one 15mg  tablet by mouth twice a day with food. On Day 22, switch to one 20mg  tablet once a day with food. 51 each 0  . Tetrahydrozoline HCl (VISINE OP) Place 1 drop into both eyes 3 (three) times daily.      Physical Exam: A+O X3 Positive SOB - difficulty walking long distance No chest pain Abd soft/nt No hip/knee/ankle pain with ROM Left calf - swollen, minimal tenderness Decreased pulses in LE's   Image: Ct Angio Chest Pe W/cm &/or Wo Cm  01/05/2015   CLINICAL DATA:  Increased seen shortness of breath today. History of prior pulmonary embolism and acute deep venous thrombosis.  EXAM: CT ANGIOGRAPHY CHEST WITH CONTRAST  TECHNIQUE: Multidetector CT imaging of the chest was performed using the standard protocol during bolus administration of intravenous  contrast. Multiplanar CT image reconstructions and MIPs were obtained to evaluate the vascular anatomy.  CONTRAST:  124mL OMNIPAQUE IOHEXOL 350 MG/ML SOLN  COMPARISON:  05/06/2014  FINDINGS: Chest wall: No chest wall mass, supraclavicular or axillary lymphadenopathy. The thyroid gland is normal. The bony thorax is intact.  Mediastinum: The heart is normal in size. No pericardial effusion. No mediastinal or hilar mass or adenopathy. The aorta is normal in caliber. No dissection. The branch vessels are patent. The esophagus is grossly normal.  The pulmonary arterial tree is fairly well opacified. There is a large filling defect in the right main pulmonary artery extending into the right lower  lobe pulmonary arteries. Small left-sided pulmonary emboli are also noted. Mild right heart strain is noted with flattening of the left ventricle. The RV LV ratio is 0.92.  Lungs: No acute pulmonary findings. No worrisome pulmonary lesions. Patchy areas of subsegmental atelectasis noted in the right lower lobe.  Upper abdomen: Numerous hepatic cysts are noted. No worrisome lesions.  Review of the MIP images confirms the above findings.  IMPRESSION: 1. Bilateral pulmonary embolism but much more significant on the right side with a large central right-sided PE. Mild right ventricular heart strain with slight flattening of the left ventricle. The RV LV ratio is 0.92. 2. No significant pulmonary findings. 3. Normal thoracic aorta.  These results will be called to the ordering clinician or representative by the Radiologist Assistant, and communication documented in the PACS or zVision Dashboard.   Electronically Signed   By: Marijo Sanes M.D.   On: 01/05/2015 13:12    A/P: Patient seen in my office today with 3 day history of left LE swelling.  Given history of DVT/PE I ordered stat ultrasound and CT scan. Ultrasound positive for DVT: clot noted from distal common femoral to PT.  Chest CT  Results noted above.  Spoke with CCM and Medical teams.  Plan on ICU admission and stat ECHO to evaluate for heart strain.  Will defer ongoing management to medical team. No issues with lumbar spine  Will continue to monitor condition.

## 2015-01-05 NOTE — Progress Notes (Addendum)
ANTICOAGULATION CONSULT NOTE - Initial Consult  Pharmacy Consult for Heparin Indication: Pulmonary Embolism, Deep Vein Thrombosis  No Known Allergies  Patient Measurements:    Height: 5'10" Weight: 100.8 kg Heparin Dosing Weight: 94.1 kg  Vital Signs: Temp: 97.7 F (36.5 C) (08/01 1600) Temp Source: Oral (08/01 1600) BP: 147/87 mmHg (08/01 1800) Pulse Rate: 71 (08/01 1800)  Labs:  Recent Labs  01/05/15 1509  HGB 14.6  HCT 44.2  PLT 122*  APTT 29  LABPROT 15.3*  INR 1.19  CREATININE 1.14    Estimated Creatinine Clearance: 88.1 mL/min (by C-G formula based on Cr of 1.14).   Medical History: Past Medical History  Diagnosis Date  . Hypertension   . GERD (gastroesophageal reflux disease)   . Arthritis     "my whole body"  . Chronic back pain     "mainly lower; some in top too" (03/26/2014)  . History of gout     Assessment: Patient is a 20 y/oM with PMH of right-sided PE and RLE DVT provoked after lumbar laminectomy. Patient completed 6 month course of Rivaroxaban 4 weeks ago. Patient developed left leg pain and swelling 3 days ago and SOB the following day. Patient seen in orthopedic MD's office today. Venous duplex of lower extremties shows acute thrombus of the distal CFV, into the femoral vein, popliteal vein, and the PTV and peroneal veins on left side. CT angio of chest reveals bilateral pulmonary embolism but much more significant on the right side with a large central right-sided PE with mild right ventricular heart strain. Patient initially placed on Rivaroxaban, now being switched to heparin drip per CCM.   Baseline coags (prior to Rivaroxaban): PT/INR 15.3/1.19, aPTT 29  CBC: H/H WNL, Pltc slightly low at 122K  SCr 1.14 with CrCl ~ 88 ml/min  NOTE: patient received dose of Rivaroxaban 15 mg PO x 1 at 1749.  Goal of Therapy:  Heparin level 0.3-0.7 units/ml  APTT 66-102 seconds Monitor platelets by anticoagulation protocol: Yes   Plan:    Discussed with Dr. Maryann Conners typically wait until next Rivaroxaban dose due to initiate heparin drip. However, given significant clot burden and ? failure on Rivaroxaban therapy, MD elects to go ahead and initiate heparin infusion now with no bolus.  Will dose somewhat more conservatively initially given recent Rivaroxaban dose given about 1 hour ago. Start heparin infusion at 900 units/hr.  Will check aPTT along with heparin level in 6 hours given recent Rivaroxaban dose. Adjust heparin infusion rate based on aPTT until aPTT and HL correlates (as Rivaroxaban can affect anti-Xa (heparin) levels).  Daily heparin level and CBC while on heparin infusion.  Monitor closely for s/s of bleeding.    Lindell Spar, PharmD, BCPS Pager: [phone] 01/05/2015 7:12 PM

## 2015-01-06 ENCOUNTER — Encounter (HOSPITAL_COMMUNITY): Payer: Self-pay | Admitting: Nurse Practitioner

## 2015-01-06 DIAGNOSIS — I10 Essential (primary) hypertension: Secondary | ICD-10-CM | POA: Diagnosis present

## 2015-01-06 DIAGNOSIS — R0602 Shortness of breath: Secondary | ICD-10-CM | POA: Diagnosis present

## 2015-01-06 DIAGNOSIS — Z79899 Other long term (current) drug therapy: Secondary | ICD-10-CM | POA: Diagnosis not present

## 2015-01-06 DIAGNOSIS — M549 Dorsalgia, unspecified: Secondary | ICD-10-CM | POA: Diagnosis present

## 2015-01-06 DIAGNOSIS — R739 Hyperglycemia, unspecified: Secondary | ICD-10-CM | POA: Diagnosis present

## 2015-01-06 DIAGNOSIS — I82442 Acute embolism and thrombosis of left tibial vein: Secondary | ICD-10-CM | POA: Diagnosis present

## 2015-01-06 DIAGNOSIS — M109 Gout, unspecified: Secondary | ICD-10-CM | POA: Diagnosis present

## 2015-01-06 DIAGNOSIS — K219 Gastro-esophageal reflux disease without esophagitis: Secondary | ICD-10-CM | POA: Diagnosis present

## 2015-01-06 DIAGNOSIS — Z86711 Personal history of pulmonary embolism: Secondary | ICD-10-CM | POA: Diagnosis not present

## 2015-01-06 DIAGNOSIS — Z7901 Long term (current) use of anticoagulants: Secondary | ICD-10-CM | POA: Diagnosis not present

## 2015-01-06 DIAGNOSIS — I82409 Acute embolism and thrombosis of unspecified deep veins of unspecified lower extremity: Secondary | ICD-10-CM | POA: Insufficient documentation

## 2015-01-06 DIAGNOSIS — Z86718 Personal history of other venous thrombosis and embolism: Secondary | ICD-10-CM | POA: Diagnosis not present

## 2015-01-06 DIAGNOSIS — I2699 Other pulmonary embolism without acute cor pulmonale: Secondary | ICD-10-CM | POA: Diagnosis present

## 2015-01-06 DIAGNOSIS — I82402 Acute embolism and thrombosis of unspecified deep veins of left lower extremity: Secondary | ICD-10-CM | POA: Diagnosis not present

## 2015-01-06 DIAGNOSIS — I82432 Acute embolism and thrombosis of left popliteal vein: Secondary | ICD-10-CM | POA: Diagnosis present

## 2015-01-06 DIAGNOSIS — I82412 Acute embolism and thrombosis of left femoral vein: Secondary | ICD-10-CM | POA: Diagnosis present

## 2015-01-06 DIAGNOSIS — G8929 Other chronic pain: Secondary | ICD-10-CM | POA: Diagnosis present

## 2015-01-06 DIAGNOSIS — K5900 Constipation, unspecified: Secondary | ICD-10-CM | POA: Diagnosis not present

## 2015-01-06 LAB — APTT
APTT: 60 s — AB (ref 24–37)
aPTT: 64 seconds — ABNORMAL HIGH (ref 24–37)
aPTT: 49 seconds — ABNORMAL HIGH (ref 24–37)

## 2015-01-06 LAB — CBC
HCT: 43.8 % (ref 39.0–52.0)
HEMOGLOBIN: 14.5 g/dL (ref 13.0–17.0)
MCH: 30.9 pg (ref 26.0–34.0)
MCHC: 33.1 g/dL (ref 30.0–36.0)
MCV: 93.2 fL (ref 78.0–100.0)
Platelets: 141 10*3/uL — ABNORMAL LOW (ref 150–400)
RBC: 4.7 MIL/uL (ref 4.22–5.81)
RDW: 16 % — ABNORMAL HIGH (ref 11.5–15.5)
WBC: 7.5 10*3/uL (ref 4.0–10.5)

## 2015-01-06 LAB — HEPARIN LEVEL (UNFRACTIONATED)
Heparin Unfractionated: >2.2 IU/mL — ABNORMAL HIGH (ref 0.30–0.70)
Heparin Unfractionated: 2.04 IU/mL — ABNORMAL HIGH (ref 0.30–0.70)

## 2015-01-06 LAB — PHOSPHORUS: PHOSPHORUS: 2.9 mg/dL (ref 2.5–4.6)

## 2015-01-06 LAB — MAGNESIUM: Magnesium: 1.7 mg/dL (ref 1.7–2.4)

## 2015-01-06 LAB — BASIC METABOLIC PANEL
Anion gap: 8 (ref 5–15)
BUN: 13 mg/dL (ref 6–20)
CO2: 26 mmol/L (ref 22–32)
CREATININE: 1.19 mg/dL (ref 0.61–1.24)
Calcium: 8.7 mg/dL — ABNORMAL LOW (ref 8.9–10.3)
Chloride: 104 mmol/L (ref 101–111)
GFR calc Af Amer: >60 mL/min (ref >60)
Glucose, Bld: 97 mg/dL (ref 65–99)
POTASSIUM: 3.9 mmol/L (ref 3.5–5.1)
SODIUM: 138 mmol/L (ref 135–145)

## 2015-01-06 LAB — ANTITHROMBIN III: ANTITHROMB III FUNC: 85 % (ref 75–120)

## 2015-01-06 MED ORDER — HEPARIN (PORCINE) IN NACL 100-0.45 UNIT/ML-% IJ SOLN
1600.0000 [IU]/h | INTRAMUSCULAR | Status: AC
  Administered 2015-01-06: 1200 [IU]/h via INTRAVENOUS
  Administered 2015-01-07: 1300 [IU]/h via INTRAVENOUS
  Administered 2015-01-09 (×2): 1600 [IU]/h via INTRAVENOUS
  Filled 2015-01-06 (×12): qty 250

## 2015-01-06 MED ORDER — HEPARIN (PORCINE) IN NACL 100-0.45 UNIT/ML-% IJ SOLN
1000.0000 [IU]/h | INTRAMUSCULAR | Status: DC
  Filled 2015-01-06: qty 250

## 2015-01-06 MED ORDER — PROMETHAZINE HCL 25 MG/ML IJ SOLN
12.5000 mg | Freq: Four times a day (QID) | INTRAMUSCULAR | Status: DC | PRN
Start: 2015-01-06 — End: 2015-01-10
  Administered 2015-01-06 – 2015-01-09 (×4): 12.5 mg via INTRAVENOUS
  Filled 2015-01-06 (×4): qty 1

## 2015-01-06 NOTE — Progress Notes (Deleted)
[facility] Progress Note Patient Name: Nicholas [name] DOB: [birth year] MRN: [id number]   Date of Service  01/06/2015  HPI/Events of Note  Patient with mild to moderate abd pain and discomfort, Fentanyl does help, but makes him sleepy. Cr~7  eICU Interventions  No nephrotoxic drugs. Lower fentanyl dose to 56mcg as need for pain.      Intervention Category Intermediate Interventions: Pain - evaluation and management  Johanna Stafford 01/06/2015, 4:57 PM

## 2015-01-06 NOTE — Progress Notes (Addendum)
ANTICOAGULATION CONSULT NOTE - F/u Consult  Pharmacy Consult for Heparin Indication: Pulmonary Embolism, Deep Vein Thrombosis  No Known Allergies  Patient Measurements:    Height: 5'10" Weight: 100.8 kg Heparin Dosing Weight: 94.1 kg  Vital Signs: Temp: 102.5 F (39.2 C) (08/02 0000) Temp Source: Oral (08/02 0000) BP: 151/94 mmHg (08/02 0100) Pulse Rate: 89 (08/02 0100)  Labs:  Recent Labs  01/05/15 1509 01/05/15 2050 01/06/15 0100  HGB 14.6  --  14.5  HCT 44.2  --  43.8  PLT 122*  --  141*  APTT 29  --  64*  LABPROT 15.3*  --   --   INR 1.19  --   --   HEPARINUNFRC  --   --  0.94*  CREATININE 1.14  --  1.19  TROPONINI  --  <0.03  --     Estimated Creatinine Clearance: 84.4 mL/min (by C-G formula based on Cr of 1.19).   Medical History: Past Medical History  Diagnosis Date  . Hypertension   . GERD (gastroesophageal reflux disease)   . Arthritis     "my whole body"  . Chronic back pain     "mainly lower; some in top too" (03/26/2014)  . History of gout     Assessment: Patient is a 74 y/oM with PMH of right-sided PE and RLE DVT provoked after lumbar laminectomy. Patient completed 6 month course of Rivaroxaban 4 weeks ago. Patient developed left leg pain and swelling 3 days ago and SOB the following day. Patient seen in orthopedic MD's office today. Venous duplex of lower extremties shows acute thrombus of the distal CFV, into the femoral vein, popliteal vein, and the PTV and peroneal veins on left side. CT angio of chest reveals bilateral pulmonary embolism but much more significant on the right side with a large central right-sided PE with mild right ventricular heart strain. Patient initially placed on Rivaroxaban, now being switched to heparin drip per CCM.   Baseline coags (prior to Rivaroxaban): PT/INR 15.3/1.19, aPTT 29  CBC: H/H WNL, Pltc slightly low at 122K  SCr 1.14 with CrCl ~ 88 ml/min  NOTE: patient received dose of Rivaroxaban 15 mg PO x 1 at  1749. Today, 01/06/15  0100 HL=>2.2 (xarelto effect) and aPtt=64, no problems per RN.  aPtt slightly below goal of 66-102  Goal of Therapy:  Heparin level 0.3-0.7 units/ml  APTT 66-102 seconds Monitor platelets by anticoagulation protocol: Yes   Plan:   Increase heparin drip to 1000 units/hr  Will check aPTT along with heparin level in 6 hours given recent Rivaroxaban dose. Adjust heparin infusion rate based on aPTT until aPTT and HL correlates (as Rivaroxaban can affect anti-Xa (heparin) levels).  Daily heparin level and CBC while on heparin infusion.  Monitor closely for s/s of bleeding.     Dorrene German 01/05/2015 7:12 PM

## 2015-01-06 NOTE — Consult Note (Signed)
Chief Complaint: Patient was seen in consultation today for possible LLE DVT thrombolysis and /or IVC filter placement   Referring Physician(s): CCM  History of Present Illness: Nicholas [name] is a 54 y.o. male with PMH of right-sided PE and RLE DVT (diagnosed 05/2014) provoked after lumbar laminectomy 03/26/14. Patient completed 6 month course of Rivaroxaban 4 weeks ago. Patient developed left leg pain and swelling 3 days ago and SOB the following day. Patient seen in orthopedic MD's office PTA. Venous duplex of lower extremties showed acute thrombus of the distal CFV, into the femoral vein, popliteal vein, and the PTV and peroneal veins on left side. CT angio of chest reveals bilateral pulmonary embolism but much more significant on the right side with a large central right-sided PE with mild right ventricular heart strain. Patient initially placed on Rivaroxaban, but then switched to heparin drip per CCM. Per CCM, no indication for thrombolytic therapy for his PE, consulted IR to evaluate need for thrombolytic tx for extensive left leg DVT and/or IVC filter placement. Pt did have 1 dose of xarelto following admission prior to starting IV heparin. He denies any additional surgical procedures since 2015 laminectomy, recent trauma, malignancy or bleeding disorders.   Past Medical History  Diagnosis Date  . Hypertension   . GERD (gastroesophageal reflux disease)   . Arthritis     "my whole body"  . Chronic back pain     "mainly lower; some in top too" (03/26/2014)  . History of gout     Past Surgical History  Procedure Laterality Date  . Carpal tunnel release Bilateral 2012  . Lumbar laminectomy/decompression microdiscectomy  03/26/2014    L3-S1  . Inguinal hernia repair Right 2000's  . Back surgery    . Decompressive lumbar laminectomy level 3 N/A 03/26/2014    Procedure: L3-S1 DECOMPRESSION;  Surgeon: Melina Schools, MD;  Location: [facility];  Service: Orthopedics;  Laterality: N/A;     Allergies: Review of patient's allergies indicates no known allergies.  Medications: Prior to Admission medications   Medication Sig Start Date End Date Taking? Authorizing Provider  colchicine 0.6 MG tablet Take 0.6 mg by mouth daily.   Yes Historical Provider, MD  losartan (COZAAR) 25 MG tablet Take 25 mg by mouth daily.   Yes Historical Provider, MD  MITIGARE 0.6 MG CAPS Take 0.6 mg by mouth daily. 12/25/14  Yes Historical Provider, MD  naproxen (NAPROSYN) 375 MG tablet Take 375 mg by mouth 2 (two) times daily as needed. For pain 12/26/14  Yes Historical Provider, MD  omeprazole (PRILOSEC) 20 MG capsule Take 20 mg by mouth daily.   Yes Historical Provider, MD  oxyCODONE (OXY IR/ROXICODONE) 5 MG immediate release tablet Take 5 mg by mouth 3 (three) times daily. 11/25/14  Yes Historical Provider, MD  oxycodone (OXY-IR) 5 MG capsule Take 5 mg by mouth every 6 (six) hours as needed for pain.   Yes Historical Provider, MD  predniSONE (DELTASONE) 5 MG tablet Take 5 mg by mouth daily as needed. For gout flare up 11/19/14  Yes Historical Provider, MD  probenecid (BENEMID) 500 MG tablet Take 500 mg by mouth daily.    Yes Historical Provider, MD  predniSONE (DELTASONE) 10 MG tablet Start on 05/09/14: Take 4 tabs daily 2 days, then 3 tabs daily 2 days, then 2 tabs daily 2 days, then 1 tab daily 2 days, then stop. Patient not taking: Reported on 01/05/2015 05/08/14   [name], MD  History reviewed. No pertinent family history.  History   Social History  . Marital Status: Married    Spouse Name: N/A  . Number of Children: N/A  . Years of Education: N/A   Social History Main Topics  . Smoking status: Former Smoker -- 1.00 packs/day for 12 years    Types: Cigarettes  . Smokeless tobacco: Former Systems developer    Types: Snuff, Chew     Comment: "quit smoking cigarettes & smokeless tobacco ~ 2000"  . Alcohol Use: 10.8 oz/week    18 Cans of beer per week     Comment: 03/26/2014 "2-3, 12oz  beers/day"  . Drug Use: No  . Sexual Activity: Yes   Other Topics Concern  . None   Social History Narrative      Review of Systems  Constitutional:       Occ chills/sweats  Respiratory:       Occasional cough and some dyspnea with exertion  Cardiovascular: Positive for leg swelling. Negative for chest pain.  Gastrointestinal: Negative for vomiting, abdominal pain and blood in stool.       Occasional nausea  Genitourinary: Negative for dysuria and hematuria.  Musculoskeletal: Positive for back pain.  Neurological: Positive for headaches.  Hematological: Does not bruise/bleed easily.     Vital Signs: BP 157/87 mmHg  Pulse 79  Temp(Src) 99 F (37.2 C) (Oral)  Resp 29  Ht 5\' 10"  (1.778 m)  Wt 222 lb 3.6 oz (100.8 kg)  BMI 31.89 kg/m2  SpO2 93%  Physical Exam  Constitutional: He is oriented to person, place, and time. He appears well-developed and well-nourished.  Cardiovascular: Normal rate and regular rhythm.   Pulmonary/Chest: Effort normal and breath sounds normal.  Abdominal: Soft. Bowel sounds are normal. There is no tenderness.  Musculoskeletal: Normal range of motion. He exhibits edema.  2-3+ left lower extremity edema up to the thigh. No edema of right lower extremity. Intact distal pulses bilaterally ;sensory /motor function intact  Neurological: He is alert and oriented to person, place, and time.    Mallampati Score:     Imaging: Ct Angio Chest Pe W/cm &/or Wo Cm  01/05/2015   CLINICAL DATA:  Increased seen shortness of breath today. History of prior pulmonary embolism and acute deep venous thrombosis.  EXAM: CT ANGIOGRAPHY CHEST WITH CONTRAST  TECHNIQUE: Multidetector CT imaging of the chest was performed using the standard protocol during bolus administration of intravenous contrast. Multiplanar CT image reconstructions and MIPs were obtained to evaluate the vascular anatomy.  CONTRAST:  137mL OMNIPAQUE IOHEXOL 350 MG/ML SOLN  COMPARISON:  05/06/2014   FINDINGS: Chest wall: No chest wall mass, supraclavicular or axillary lymphadenopathy. The thyroid gland is normal. The bony thorax is intact.  Mediastinum: The heart is normal in size. No pericardial effusion. No mediastinal or hilar mass or adenopathy. The aorta is normal in caliber. No dissection. The branch vessels are patent. The esophagus is grossly normal.  The pulmonary arterial tree is fairly well opacified. There is a large filling defect in the right main pulmonary artery extending into the right lower lobe pulmonary arteries. Small left-sided pulmonary emboli are also noted. Mild right heart strain is noted with flattening of the left ventricle. The RV LV ratio is 0.92.  Lungs: No acute pulmonary findings. No worrisome pulmonary lesions. Patchy areas of subsegmental atelectasis noted in the right lower lobe.  Upper abdomen: Numerous hepatic cysts are noted. No worrisome lesions.  Review of the MIP images confirms the above findings.  IMPRESSION: 1. Bilateral pulmonary embolism but much more significant on the right side with a large central right-sided PE. Mild right ventricular heart strain with slight flattening of the left ventricle. The RV LV ratio is 0.92. 2. No significant pulmonary findings. 3. Normal thoracic aorta.  These results will be called to the ordering clinician or representative by the Radiologist Assistant, and communication documented in the PACS or zVision Dashboard.   Electronically Signed   By: Marijo Sanes M.D.   On: 01/05/2015 13:12    Labs:  CBC:  Recent Labs  05/07/14 0203 05/08/14 0515 01/05/15 1509 01/06/15 0100  WBC 5.7 4.7 6.7 7.5  HGB 14.4 13.7 14.6 14.5  HCT 42.8 40.8 44.2 43.8  PLT 161 166 122* 141*    COAGS:  Recent Labs  03/24/14 1424 05/06/14 1823 05/07/14 0203 01/05/15 1509 01/06/15 0100 01/06/15 1025  INR 1.15 1.09 1.10 1.19  --   --   APTT 28 28 123* 29 64* 49*    BMP:  Recent Labs  05/06/14 1823 05/07/14 0203 01/05/15 1509  01/06/15 0100  NA 141 140 140 138  K 4.5 3.8 4.3 3.9  CL 102 100 104 104  CO2 25 28 26 26   GLUCOSE 90 88 97 97  BUN 10 10 13 13   CALCIUM 9.5 9.6 9.1 8.7*  CREATININE 1.23 1.18 1.14 1.19  GFRNONAA 65* 68* >60 >60  GFRAA 75* 79* >60 >60    LIVER FUNCTION TESTS:  Recent Labs  03/24/14 1424 05/06/14 1823 05/07/14 0203 01/05/15 1509  BILITOT 1.0 0.7 0.6 1.1  AST 83* 70* 57* 35  ALT 122* 90* 84* 41  ALKPHOS 58 52 61 42  PROT 7.1 6.5 6.7 6.5  ALBUMIN 3.8 3.7 3.8 3.4*    TUMOR MARKERS: No results for input(s): AFPTM, CEA, CA199, CHROMGRNA in the last 8760 hours.  Assessment and Plan: Patient with history of lumbar spine surgery October 2015 with subsequent finding of right sided PE and right lower extremity DVT December 2015, status post 6 month course of xarelto, stopped 4 weeks ago ; admitted now with dyspnea, acute left lower extremity pain, swelling, finding of extensive left lower extremity DVT as well as bilateral PE. Request now received for consideration of left lower extremity DVT thrombolysis as well as possible IVC filter placement. All imaging studies and clinical history were reviewed by Dr. Pascal Lux. Details/risks of thrombolytic therapy , including but not limited to, internal bleeding, site infection, renal injury, inability to completely eradicate clot were discussed with patient and wife. At this time they would prefer to continue with IV heparin therapy for an additional 24 hours to see if symptoms improve. If patient's clinical status does not improve he would favor proceeding with thrombolytic therapy of left lower extremity DVT. If status improves will discuss placement of IVC filter on 8/3. Recommend holding Xarelto for now with continuation of IV heparin. If thrombolysis is performed pt will require transfer to [facility] ICU for treatment.    Thank you for this interesting consult.  I greatly enjoyed meeting HOANG PETTINGILL and look forward to participating in their care.  A  copy of this report was sent to the requesting provider on this date.  Signed: D. Rowe Robert 01/06/2015, 2:13 PM   I spent a total of 40 minutes in face to face in clinical consultation, greater than 50% of which was counseling/coordinating care for possible left lower extremity DVT thrombolysis/IVC filter placement

## 2015-01-06 NOTE — Progress Notes (Signed)
PCCM PROGRESS NOTE  SUBJECTIVE: Breathing okay at rest.  Winded with movement.  Has terrible pain in Lt leg.  OBJECTIVE: Temp:  [97.7 F (36.5 C)-102.5 F (39.2 C)] 98.6 F (37 C) (08/02 0745) Pulse Rate:  [66-89] 76 (08/02 0800) Resp:  [17-28] 26 (08/02 0800) BP: (143-167)/(87-101) 152/101 mmHg (08/02 0800) SpO2:  [3 %-99 %] 94 % (08/02 0800) Weight:  [222 lb 3.6 oz (100.8 kg)] 222 lb 3.6 oz (100.8 kg) (08/02 0800)  General: pleasant HEENT: no sinus tenderness Cardiac: regular, no murmur Chest: no wheeze Abd: soft, non tender Ext: Lt leg tender, swollen Neuro: normal strength Skin: no rash  CMP Latest Ref Rng 01/06/2015 01/05/2015 05/07/2014  Glucose 65 - 99 mg/dL 97 97 88  BUN 6 - 20 mg/dL 13 13 10   Creatinine 0.61 - 1.24 mg/dL 1.19 1.14 1.18  Sodium 135 - 145 mmol/L 138 140 140  Potassium 3.5 - 5.1 mmol/L 3.9 4.3 3.8  Chloride 101 - 111 mmol/L 104 104 100  CO2 22 - 32 mmol/L 26 26 28   Calcium 8.9 - 10.3 mg/dL 8.7(L) 9.1 9.6  Total Protein 6.5 - 8.1 g/dL - 6.5 6.7  Total Bilirubin 0.3 - 1.2 mg/dL - 1.1 0.6  Alkaline Phos 38 - 126 U/L - 42 61  AST 15 - 41 U/L - 35 57(H)  ALT 17 - 63 U/L - 41 84(H)    CBC Latest Ref Rng 01/06/2015 01/05/2015 05/08/2014  WBC 4.0 - 10.5 K/uL 7.5 6.7 4.7  Hemoglobin 13.0 - 17.0 g/dL 14.5 14.6 13.7  Hematocrit 39.0 - 52.0 % 43.8 44.2 40.8  Platelets 150 - 400 K/uL 141(L) 122(L) 166    Cardiac Panel (last 3 results)  Recent Labs  01/05/15 2050  TROPONINI <0.03    STUDIES: 8/01 Doppler legs >> Lt leg DVT from common femoral vein down 8/01 CT chest >> Rt main PA PE, RV:LV ratio 0.92 8/01 Echo >> mild LVH, EF 65 to 70%, PAS 34 mmHg 8/01 Hypercoagulable panel >>  EVENTS: 8/01 Admit, started heparin gtt  DISCUSSION: 54 yo male with presumed provoked clot after lumbar spine surgery in Fall 2015.  He was tx with 7 months of xarelto, which was d/c'ed about 1 month prior to admission.  He developed Lt leg pain and swelling, and then dyspnea  on exertion.  He was found to have PE and extensive Lt leg DVT.  His oxygenation and hemodynamics are stable.  Main complaint at present is Lt leg pain.  ASSESSMENT/PLAN:  Acute pulmonary embolism. Plan: - continue heparin gtt for now - no indication for thrombolytic therapy for his PE - will eventually need to transition to [place] - f/u hypercoagulable panel from 8/01  Extensive left leg DVT. Plan: - will has IR to review whether directed thrombolytic therapy would be of benefit  History of HTN. Plan: - hold outpt cozaar  History of GERD. Plan: - continue protonix  History of gout. Plan: - continue probenecid, colchicine  Chronic back pain. Plan: - prn analgesics  Fever x 1 (Tm 102.5) 8/01 >> no other signs of infection. Plan: - monitor clinically off Abx  Updated pt's wife at bedside   Chesley Mires, MD [place] 01/06/2015, 10:22 AM Pager:  [phone] After 3pm call: [phone]

## 2015-01-06 NOTE — Progress Notes (Signed)
ANTICOAGULATION CONSULT NOTE - F/u Consult  Pharmacy Consult for Heparin Indication: Pulmonary Embolism, Deep Vein Thrombosis  No Known Allergies  Patient Measurements:  Height: 5\' 10"  (177.8 cm) Weight: 222 lb 3.6 oz (100.8 kg) IBW/kg (Calculated) : 73  Heparin Dosing Weight: 94.1 kg  Vital Signs: Temp: 99.4 F (37.4 C) (08/02 1603) Temp Source: Oral (08/02 1603) BP: 150/73 mmHg (08/02 1550) Pulse Rate: 82 (08/02 1800)  Labs:  Recent Labs  01/05/15 1509 01/05/15 2050 01/06/15 0100 01/06/15 1025 01/06/15 1811  HGB 14.6  --  14.5  --   --   HCT 44.2  --  43.8  --   --   PLT 122*  --  141*  --   --   APTT 29  --  64* 49* 60*  LABPROT 15.3*  --   --   --   --   INR 1.19  --   --   --   --   HEPARINUNFRC  --   --  >2.20* 2.04*  --   CREATININE 1.14  --  1.19  --   --   TROPONINI  --  <0.03  --   --   --     Estimated Creatinine Clearance: 84.4 mL/min (by C-G formula based on Cr of 1.19).   Medical History: Past Medical History  Diagnosis Date  . Hypertension   . GERD (gastroesophageal reflux disease)   . Arthritis     "my whole body"  . Chronic back pain     "mainly lower; some in top too" (03/26/2014)  . History of gout     Assessment: Patient is a 89 y/oM with PMH of right-sided PE and RLE DVT provoked after lumbar laminectomy. Patient completed 6 month course of Rivaroxaban 4 weeks ago. Patient developed left leg pain and swelling 3 days ago and SOB the following day. Patient seen in orthopedic MD's office PTA. Venous duplex of lower extremties showed acute thrombus of the distal CFV, into the femoral vein, popliteal vein, and the PTV and peroneal veins on left side. CT angio of chest reveals bilateral pulmonary embolism but much more significant on the right side with a large central right-sided PE with mild right ventricular heart strain. Patient initially placed on Rivaroxaban, but then switched to heparin drip per CCM.  Per CCM, no indication for  thrombolytic therapy for his PE, consulted IR to evaluate need for thrombolytic tx for extensive left leg DVT.  Significant events: 8/1 received Xarelto 15 mg x 1 @1749  prior to initiation of heparin infusion  Today, 01/06/2015:   Monitoring aPTT until effects of Xarelto on heparin level has diminished.    aPTT = 60 seconds (slightly subtherapeutic) with heparin infusion at 1200 units/hr.  CBC: H/H WNL, Pltc slightly low at 141K  SCr 1.19 with CrCl ~ 84 ml/min  No bleeding/complications reported.   Goal of Therapy:  aPTT 66-102 seconds Heparin level 0.3-0.7 units/ml  Monitor platelets by anticoagulation protocol: Yes   Plan:   Increase heparin infusion rate to 1300 units/hr.  Titrating heparin infusion using aPTTs until levels correlate with heparin levels.  Daily heparin level and CBC while on heparin infusion.  Monitor closely for s/s of bleeding.   Peggyann Juba, PharmD, BCPS Pager: [phone]  01/06/2015 6:52 PM

## 2015-01-06 NOTE — Progress Notes (Signed)
ANTICOAGULATION CONSULT NOTE - F/u Consult  Pharmacy Consult for Heparin Indication: Pulmonary Embolism, Deep Vein Thrombosis  No Known Allergies  Patient Measurements:  Height: 5\' 10"  (177.8 cm) Weight: 222 lb 3.6 oz (100.8 kg) IBW/kg (Calculated) : 73  Heparin Dosing Weight: 94.1 kg  Vital Signs: Temp: 98.6 F (37 C) (08/02 0745) Temp Source: Oral (08/02 0745) BP: 157/87 mmHg (08/02 1200) Pulse Rate: 79 (08/02 1200)  Labs:  Recent Labs  01/05/15 1509 01/05/15 2050 01/06/15 0100 01/06/15 1025  HGB 14.6  --  14.5  --   HCT 44.2  --  43.8  --   PLT 122*  --  141*  --   APTT 29  --  64* 49*  LABPROT 15.3*  --   --   --   INR 1.19  --   --   --   HEPARINUNFRC  --   --  >2.20* 2.04*  CREATININE 1.14  --  1.19  --   TROPONINI  --  <0.03  --   --     Estimated Creatinine Clearance: 84.4 mL/min (by C-G formula based on Cr of 1.19).   Medical History: Past Medical History  Diagnosis Date  . Hypertension   . GERD (gastroesophageal reflux disease)   . Arthritis     "my whole body"  . Chronic back pain     "mainly lower; some in top too" (03/26/2014)  . History of gout     Assessment: Patient is a 53 y/oM with PMH of right-sided PE and RLE DVT provoked after lumbar laminectomy. Patient completed 6 month course of Rivaroxaban 4 weeks ago. Patient developed left leg pain and swelling 3 days ago and SOB the following day. Patient seen in orthopedic MD's office PTA. Venous duplex of lower extremties showed acute thrombus of the distal CFV, into the femoral vein, popliteal vein, and the PTV and peroneal veins on left side. CT angio of chest reveals bilateral pulmonary embolism but much more significant on the right side with a large central right-sided PE with mild right ventricular heart strain. Patient initially placed on Rivaroxaban, but then switched to heparin drip per CCM.  Per CCM, no indication for thrombolytic therapy for his PE, consulted IR to evaluate need for  thrombolytic tx for extensive left leg DVT.  Significant events: 8/1 received Xarelto 15 mg x 1 @1749  prior to initiation of heparin infusion  Today, 01/06/2015:  Heparin level elevated at 2.04 as a reflection of recent Xarelto dose. Monitoring aPTT until effects of Xarelto on heparin level has diminished.  aPTT = 49 seconds (subtherapeutic) with heparin infusion at 1000 units/hr.  CBC: H/H WNL, Pltc slightly low at 141K  SCr 1.19 with CrCl ~ 84 ml/min  No bleeding/complications reported.   Goal of Therapy:  aPTT 66-102 seconds Heparin level 0.3-0.7 units/ml  Monitor platelets by anticoagulation protocol: Yes   Plan:   Increase heparin infusion rate to 1200 units/hr.  Check aPTT 6 hours following rate increase.  Titrating heparin infusion using aPTTs until levels correlate with heparin levels.  Daily heparin level and CBC while on heparin infusion.  Monitor closely for s/s of bleeding.   Hershal Coria, PharmD, BCPS Pager: [phone] 01/06/2015 12:16 PM

## 2015-01-07 ENCOUNTER — Inpatient Hospital Stay (HOSPITAL_COMMUNITY): Payer: BLUE CROSS/BLUE SHIELD

## 2015-01-07 ENCOUNTER — Encounter (HOSPITAL_COMMUNITY): Payer: Self-pay

## 2015-01-07 LAB — HEXAGONAL PHASE PHOSPHOLIPID: Hexagonal Phase Phospholipid: 19.3 s — ABNORMAL HIGH (ref 0.0–8.0)

## 2015-01-07 LAB — CBC
HCT: 42.2 % (ref 39.0–52.0)
Hemoglobin: 14.2 g/dL (ref 13.0–17.0)
MCH: 31.3 pg (ref 26.0–34.0)
MCHC: 33.6 g/dL (ref 30.0–36.0)
MCV: 93 fL (ref 78.0–100.0)
Platelets: 145 10*3/uL — ABNORMAL LOW (ref 150–400)
RBC: 4.54 MIL/uL (ref 4.22–5.81)
RDW: 15.6 % — ABNORMAL HIGH (ref 11.5–15.5)
WBC: 8.1 10*3/uL (ref 4.0–10.5)

## 2015-01-07 LAB — PROTEIN S ACTIVITY: Protein S Activity: 118 % (ref 60–145)

## 2015-01-07 LAB — PROTEIN S, TOTAL: Protein S Ag, Total: 148 % (ref 58–150)

## 2015-01-07 LAB — LUPUS ANTICOAGULANT PANEL
DRVVT: 51.4 s (ref 0.0–55.1)
PTT Lupus Anticoagulant: 55.2 s — ABNORMAL HIGH (ref 0.0–50.0)

## 2015-01-07 LAB — BETA-2-GLYCOPROTEIN I ABS, IGG/M/A

## 2015-01-07 LAB — PROTIME-INR
INR: 1.35 (ref 0.00–1.49)
Prothrombin Time: 16.8 seconds — ABNORMAL HIGH (ref 11.6–15.2)

## 2015-01-07 LAB — PTT-LA MIX: PTT-LA Mix: 44.8 s (ref 0.0–50.0)

## 2015-01-07 LAB — PROTEIN C ACTIVITY: PROTEIN C ACTIVITY: 122 % (ref 74–151)

## 2015-01-07 LAB — HOMOCYSTEINE: Homocysteine: 18.7 umol/L — ABNORMAL HIGH (ref 0.0–15.0)

## 2015-01-07 LAB — GLUCOSE, CAPILLARY: Glucose-Capillary: 181 mg/dL — ABNORMAL HIGH (ref 65–99)

## 2015-01-07 LAB — PTT-LA INCUB MIX: PTT-LA Incub Mix: 59.5 s — ABNORMAL HIGH (ref 0.0–50.0)

## 2015-01-07 LAB — APTT: aPTT: 73 seconds — ABNORMAL HIGH (ref 24–37)

## 2015-01-07 LAB — HEPARIN LEVEL (UNFRACTIONATED)
HEPARIN UNFRACTIONATED: 0.66 [IU]/mL (ref 0.30–0.70)
Heparin Unfractionated: 0.55 IU/mL (ref 0.30–0.70)

## 2015-01-07 MED ORDER — METHYLPREDNISOLONE SODIUM SUCC 40 MG IJ SOLR
20.0000 mg | Freq: Every day | INTRAMUSCULAR | Status: DC
Start: 2015-01-07 — End: 2015-01-08
  Administered 2015-01-07: 20 mg via INTRAVENOUS
  Filled 2015-01-07: qty 0.5
  Filled 2015-01-07: qty 1

## 2015-01-07 NOTE — Progress Notes (Signed)
Patient ID: Nicholas [name], male   DOB: 05-06-1961, 54 y.o.   MRN: [id number]    Referring Physician(s): CCM  Chief Complaint:  Extensive left lower extremity DVT/PE  Subjective: Patient continues to have left lower extremity edema and discomfort. He has noticed minimal  change in status on IV heparin. He also complains of persistent bitemporal headache and intermittent nausea. He denies chest pain, worsening dyspnea, vomiting or abnormal bleeding.   Allergies: Review of patient's allergies indicates no known allergies.  Medications: Prior to Admission medications   Medication Sig Start Date End Date Taking? Authorizing Provider  colchicine 0.6 MG tablet Take 0.6 mg by mouth daily.   Yes Historical Provider, MD  losartan (COZAAR) 25 MG tablet Take 25 mg by mouth daily.   Yes Historical Provider, MD  MITIGARE 0.6 MG CAPS Take 0.6 mg by mouth daily. 12/25/14  Yes Historical Provider, MD  naproxen (NAPROSYN) 375 MG tablet Take 375 mg by mouth 2 (two) times daily as needed. For pain 12/26/14  Yes Historical Provider, MD  omeprazole (PRILOSEC) 20 MG capsule Take 20 mg by mouth daily.   Yes Historical Provider, MD  oxyCODONE (OXY IR/ROXICODONE) 5 MG immediate release tablet Take 5 mg by mouth 3 (three) times daily. 11/25/14  Yes Historical Provider, MD  oxycodone (OXY-IR) 5 MG capsule Take 5 mg by mouth every 6 (six) hours as needed for pain.   Yes Historical Provider, MD  predniSONE (DELTASONE) 5 MG tablet Take 5 mg by mouth daily as needed. For gout flare up 11/19/14  Yes Historical Provider, MD  probenecid (BENEMID) 500 MG tablet Take 500 mg by mouth daily.    Yes Historical Provider, MD  predniSONE (DELTASONE) 10 MG tablet Start on 05/09/14: Take 4 tabs daily 2 days, then 3 tabs daily 2 days, then 2 tabs daily 2 days, then 1 tab daily 2 days, then stop. Patient not taking: Reported on 01/05/2015 05/08/14   [name], MD     Vital Signs: BP 130/76 mmHg  Pulse 76  Temp(Src) 100.5 F  (38.1 C) (Oral)  Resp 17  Ht 5\' 10"  (1.778 m)  Wt 222 lb 3.6 oz (100.8 kg)  BMI 31.89 kg/m2  SpO2 95%  Physical Exam patient awake, alert. 2-3+ left lower extremity edema persists. Intact distal pulses. Heart with regular rate and rhythm; chest clear to auscultation bilaterally. Abdomen soft, positive bowel sounds, nontender.  Imaging: Ct Angio Chest Pe W/cm &/or Wo Cm  01/05/2015   CLINICAL DATA:  Increased seen shortness of breath today. History of prior pulmonary embolism and acute deep venous thrombosis.  EXAM: CT ANGIOGRAPHY CHEST WITH CONTRAST  TECHNIQUE: Multidetector CT imaging of the chest was performed using the standard protocol during bolus administration of intravenous contrast. Multiplanar CT image reconstructions and MIPs were obtained to evaluate the vascular anatomy.  CONTRAST:  129mL OMNIPAQUE IOHEXOL 350 MG/ML SOLN  COMPARISON:  05/06/2014  FINDINGS: Chest wall: No chest wall mass, supraclavicular or axillary lymphadenopathy. The thyroid gland is normal. The bony thorax is intact.  Mediastinum: The heart is normal in size. No pericardial effusion. No mediastinal or hilar mass or adenopathy. The aorta is normal in caliber. No dissection. The branch vessels are patent. The esophagus is grossly normal.  The pulmonary arterial tree is fairly well opacified. There is a large filling defect in the right main pulmonary artery extending into the right lower lobe pulmonary arteries. Small left-sided pulmonary emboli are also noted. Mild right heart strain is noted with  flattening of the left ventricle. The RV LV ratio is 0.92.  Lungs: No acute pulmonary findings. No worrisome pulmonary lesions. Patchy areas of subsegmental atelectasis noted in the right lower lobe.  Upper abdomen: Numerous hepatic cysts are noted. No worrisome lesions.  Review of the MIP images confirms the above findings.  IMPRESSION: 1. Bilateral pulmonary embolism but much more significant on the right side with a large  central right-sided PE. Mild right ventricular heart strain with slight flattening of the left ventricle. The RV LV ratio is 0.92. 2. No significant pulmonary findings. 3. Normal thoracic aorta.  These results will be called to the ordering clinician or representative by the Radiologist Assistant, and communication documented in the PACS or zVision Dashboard.   Electronically Signed   By: Marijo Sanes M.D.   On: 01/05/2015 13:12    Labs:  CBC:  Recent Labs  05/08/14 0515 01/05/15 1509 01/06/15 0100 01/07/15 0333  WBC 4.7 6.7 7.5 8.1  HGB 13.7 14.6 14.5 14.2  HCT 40.8 44.2 43.8 42.2  PLT 166 122* 141* 145*    COAGS:  Recent Labs  05/06/14 1823 05/07/14 0203 01/05/15 1509 01/06/15 0100 01/06/15 1025 01/06/15 1811 01/07/15 0333  INR 1.09 1.10 1.19  --   --   --  1.35  APTT 28 123* 29 64* 49* 60* 73*    BMP:  Recent Labs  05/06/14 1823 05/07/14 0203 01/05/15 1509 01/06/15 0100  NA 141 140 140 138  K 4.5 3.8 4.3 3.9  CL 102 100 104 104  CO2 25 28 26 26   GLUCOSE 90 88 97 97  BUN 10 10 13 13   CALCIUM 9.5 9.6 9.1 8.7*  CREATININE 1.23 1.18 1.14 1.19  GFRNONAA 65* 68* >60 >60  GFRAA 75* 79* >60 >60    LIVER FUNCTION TESTS:  Recent Labs  03/24/14 1424 05/06/14 1823 05/07/14 0203 01/05/15 1509  BILITOT 1.0 0.7 0.6 1.1  AST 83* 70* 57* 35  ALT 122* 90* 84* 41  ALKPHOS 58 52 61 42  PROT 7.1 6.5 6.7 6.5  ALBUMIN 3.8 3.7 3.8 3.4*    Assessment and Plan: Patient with prior history of right-sided PE and right lower extremity DVT in 2015, treated with 6 month course of xarelto; admitted now with bilateral PE and new acute symptomatic extensive left lower extremity DVT. Request received for left lower extremity DVT thrombolysis. Patient seen in consultation on 8/2 by Dr. Pascal Lux. At this time patient has decided to pursue thrombolytic therapy of the left lower extremity DVT. Details/risks of procedure, including but not limited to, nontarget bleeding, contrast and  radiation exposure, infection, renal injury, inability to completely lyse thrombus discussed with patient and wife with their understanding and consent. Procedure tentatively scheduled for 8/4. Continue IV heparin therapy for now. Check CT head secondary to persistent headache prior to  lytic therapy.   Signed: D. Rowe Robert 01/07/2015, 2:18 PM   I spent a total of 15 minutes in face to face in clinical consultation/evaluation, greater than 50% of which was counseling/coordinating care for LLE DVT thrombolysis

## 2015-01-07 NOTE — Progress Notes (Signed)
Patient continues to complain of severe temporal headache uncontrolled by pain meds - 9/10.

## 2015-01-07 NOTE — Progress Notes (Signed)
ANTICOAGULATION CONSULT NOTE - F/u Consult  Pharmacy Consult for Heparin Indication: Pulmonary Embolism, Deep Vein Thrombosis  No Known Allergies  Patient Measurements:  Height: 5\' 10"  (177.8 cm) Weight: 222 lb 3.6 oz (100.8 kg) IBW/kg (Calculated) : 73  Heparin Dosing Weight: 94.1 kg  Vital Signs: Temp: 98.5 F (36.9 C) (08/03 0000) Temp Source: Oral (08/03 0000) BP: 143/83 mmHg (08/03 0300) Pulse Rate: 71 (08/03 0300)  Labs:  Recent Labs  01/05/15 1509 01/05/15 2050 01/06/15 0100 01/06/15 1025 01/06/15 1811 01/07/15 0333  HGB 14.6  --  14.5  --   --  14.2  HCT 44.2  --  43.8  --   --  42.2  PLT 122*  --  141*  --   --  145*  APTT 29  --  64* 49* 60* 73*  LABPROT 15.3*  --   --   --   --  16.8*  INR 1.19  --   --   --   --  1.35  HEPARINUNFRC  --   --  >2.20* 2.04*  --  0.66  CREATININE 1.14  --  1.19  --   --   --   TROPONINI  --  <0.03  --   --   --   --     Estimated Creatinine Clearance: 84.4 mL/min (by C-G formula based on Cr of 1.19).   Medical History: Past Medical History  Diagnosis Date  . Hypertension   . GERD (gastroesophageal reflux disease)   . Arthritis     "my whole body"  . Chronic back pain     "mainly lower; some in top too" (03/26/2014)  . History of gout     Assessment: Patient is a 13 y/oM with PMH of right-sided PE and RLE DVT provoked after lumbar laminectomy. Patient completed 6 month course of Rivaroxaban 4 weeks ago. Patient developed left leg pain and swelling 3 days ago and SOB the following day. Patient seen in orthopedic MD's office PTA. Venous duplex of lower extremties showed acute thrombus of the distal CFV, into the femoral vein, popliteal vein, and the PTV and peroneal veins on left side. CT angio of chest reveals bilateral pulmonary embolism but much more significant on the right side with a large central right-sided PE with mild right ventricular heart strain. Patient initially placed on Rivaroxaban, but then switched to  heparin drip per CCM.  Per CCM, no indication for thrombolytic therapy for his PE, consulted IR to evaluate need for thrombolytic tx for extensive left leg DVT.  Significant events: 8/1 received Xarelto 15 mg x 1 @1749  prior to initiation of heparin infusion  01/06/15   Monitoring aPTT until effects of Xarelto on heparin level has diminished.    aPTT = 60 seconds (slightly subtherapeutic) with heparin infusion at 1200 units/hr.  CBC: H/H WNL, Pltc slightly low at 141K  SCr 1.19 with CrCl ~ 84 ml/min  No bleeding/complications reported.  Today, 01/07/15  HL=0.66, aPtt=73, will stop aPtt at this time as levels correlate (xarelto effect diminished)  Goal of Therapy:  aPTT 66-102 seconds Heparin level 0.3-0.7 units/ml  Monitor platelets by anticoagulation protocol: Yes   Plan:   Continue heparin drip @ 1300 units/hr.  Will recheck HL @ 1130 today to ensure stays in range then daily  Daily heparin level and CBC while on heparin infusion.  Monitor closely for s/s of bleeding.    Dorrene German  01/07/2015 4:12 AM

## 2015-01-07 NOTE — Progress Notes (Addendum)
PCCM PROGRESS NOTE  SUBJECTIVE: Breathing okay.  pain in Lt leg better Headache  OBJECTIVE: Temp:  [98.5 F (36.9 C)-102.5 F (39.2 C)] 98.6 F (37 C) (08/03 0400) Pulse Rate:  [71-87] 87 (08/03 0800) Resp:  [14-29] 20 (08/03 0800) BP: (128-168)/(73-115) 154/87 mmHg (08/03 0800) SpO2:  [87 %-99 %] 95 % (08/03 0800)  General: pleasant HEENT: no sinus tenderness Cardiac: regular, no murmur Chest: no wheeze Abd: soft, non tender Ext: Lt leg tender, swollen Neuro: normal strength Skin: no rash  CMP Latest Ref Rng 01/06/2015 01/05/2015 05/07/2014  Glucose 65 - 99 mg/dL 97 97 88  BUN 6 - 20 mg/dL 13 13 10   Creatinine 0.61 - 1.24 mg/dL 1.19 1.14 1.18  Sodium 135 - 145 mmol/L 138 140 140  Potassium 3.5 - 5.1 mmol/L 3.9 4.3 3.8  Chloride 101 - 111 mmol/L 104 104 100  CO2 22 - 32 mmol/L 26 26 28   Calcium 8.9 - 10.3 mg/dL 8.7(L) 9.1 9.6  Total Protein 6.5 - 8.1 g/dL - 6.5 6.7  Total Bilirubin 0.3 - 1.2 mg/dL - 1.1 0.6  Alkaline Phos 38 - 126 U/L - 42 61  AST 15 - 41 U/L - 35 57(H)  ALT 17 - 63 U/L - 41 84(H)    CBC Latest Ref Rng 01/07/2015 01/06/2015 01/05/2015  WBC 4.0 - 10.5 K/uL 8.1 7.5 6.7  Hemoglobin 13.0 - 17.0 g/dL 14.2 14.5 14.6  Hematocrit 39.0 - 52.0 % 42.2 43.8 44.2  Platelets 150 - 400 K/uL 145(L) 141(L) 122(L)    Cardiac Panel (last 3 results)  Recent Labs  01/05/15 2050  TROPONINI <0.03    STUDIES: 8/01 Doppler legs >> Lt leg DVT from common femoral vein down 8/01 CT chest >> Rt main PA PE, RV:LV ratio 0.92 8/01 Echo >> mild LVH, EF 65 to 70%, PAS 34 mmHg 8/01 Hypercoagulable panel >>  EVENTS: 8/01 Admit, started heparin gtt  DISCUSSION: 54 yo male with presumed provoked RLE DVT/ PE after lumbar spine surgery in Fall 2015.  He was tx with 7 months of xarelto, which was d/c'ed about 1 month prior to admission.  He is readm with BL  PE and extensive Lt leg DVT.  His oxygenation and hemodynamics are stable.  Main complaint at present is Lt leg  pain.  ASSESSMENT/PLAN:  Acute pulmonary embolism. Plan: - continue heparin gtt for now - no indication for thrombolytic therapy for his PE, - will eventually need to transition to xarelto  - f/u hypercoagulable panel from 8/01  Extensive left leg DVT. Plan: -  thrombolytic therapy of left lower extremity DVT being considered  History of HTN. Plan: - hold outpt cozaar  History of GERD. Plan: - continue protonix  Acute flare of gout. Plan: - continue probenecid, colchicine -solumedrol 20 x 1, pred 10 mg starting tomorrow  Chronic back pain. Plan: - prn analgesics  Headache - x 3 days - proceed with head CT prior to lytics  Fever x 1 (Tm 102.5) 8/01 >> no other signs of infection. Plan: - monitor clinically off Abx  Updated pt & wife at bedside   Kara Mead MD. Shade Flood. [facility] Pager [phone] If no response call [phone]   01/07/2015, 9:44 AM

## 2015-01-07 NOTE — Progress Notes (Signed)
ANTICOAGULATION CONSULT NOTE - F/u Consult  Pharmacy Consult for Heparin Indication: Pulmonary Embolism, Deep Vein Thrombosis  No Known Allergies  Patient Measurements:  Height: 5\' 10"  (177.8 cm) Weight: 222 lb 3.6 oz (100.8 kg) IBW/kg (Calculated) : 73  Heparin Dosing Weight: 94.1 kg  Vital Signs: Temp: 98.6 F (37 C) (08/03 0400) Temp Source: Oral (08/03 0000) BP: 154/87 mmHg (08/03 0800) Pulse Rate: 87 (08/03 0800)  Labs:  Recent Labs  01/05/15 1509 01/05/15 2050 01/06/15 0100 01/06/15 1025 01/06/15 1811 01/07/15 0333  HGB 14.6  --  14.5  --   --  14.2  HCT 44.2  --  43.8  --   --  42.2  PLT 122*  --  141*  --   --  145*  APTT 29  --  64* 49* 60* 73*  LABPROT 15.3*  --   --   --   --  16.8*  INR 1.19  --   --   --   --  1.35  HEPARINUNFRC  --   --  >2.20* 2.04*  --  0.66  CREATININE 1.14  --  1.19  --   --   --   TROPONINI  --  <0.03  --   --   --   --     Estimated Creatinine Clearance: 84.4 mL/min (by C-G formula based on Cr of 1.19).   Medical History: Past Medical History  Diagnosis Date  . Hypertension   . GERD (gastroesophageal reflux disease)   . Arthritis     "my whole body"  . Chronic back pain     "mainly lower; some in top too" (03/26/2014)  . History of gout     Assessment: Patient is a 37 y/oM with PMH of right-sided PE and RLE DVT provoked after lumbar laminectomy. Patient completed 6 month course of Rivaroxaban 4 weeks ago. Patient developed left leg pain and swelling 3 days ago and SOB the following day. Patient seen in orthopedic MD's office PTA. Venous duplex of lower extremties showed acute thrombus of the distal CFV, into the femoral vein, popliteal vein, and the PTV and peroneal veins on left side. CT angio of chest reveals bilateral pulmonary embolism but much more significant on the right side with a large central right-sided PE with mild right ventricular heart strain. Patient initially placed on Rivaroxaban, but then switched to  heparin drip per CCM.  Per CCM, no indication for thrombolytic therapy for his PE, consulted IR to evaluate need for thrombolytic tx for extensive left leg DVT.  Significant events: 8/1 received Xarelto 15 mg x 1 @1749  prior to initiation of heparin infusion  Today, 01/07/2015:  Heparin level 0.55, remains therapeutic on heparin infusion at 1300 units/hr.  CBC: H/H WNL and stable, Pltc slightly low at 145K but stable.  SCr 1.19 with CrCl ~ 84 ml/min (01/06/15)  No bleeding/complications reported.   Goal of Therapy:  Heparin level 0.3-0.7 units/ml  Monitor platelets by anticoagulation protocol: Yes   Plan:  Continue heparin IV infusion at 1300 units/hr  Daily heparin level and CBC  Monitor closely for s/s of bleeding.  Follow up plans for thrombolytic therapy, currently planned for 8/4.  Gretta Arab PharmD, BCPS Pager [phone] 01/07/2015 10:21 AM

## 2015-01-08 ENCOUNTER — Inpatient Hospital Stay (HOSPITAL_COMMUNITY): Payer: BLUE CROSS/BLUE SHIELD

## 2015-01-08 LAB — CBC
HCT: 40.9 % (ref 39.0–52.0)
HCT: 43 % (ref 39.0–52.0)
HCT: 39.7 % (ref 39.0–52.0)
HEMATOCRIT: 39.4 % (ref 39.0–52.0)
HEMOGLOBIN: 13.8 g/dL (ref 13.0–17.0)
Hemoglobin: 14.3 g/dL (ref 13.0–17.0)
Hemoglobin: 13.5 g/dL (ref 13.0–17.0)
Hemoglobin: 13.4 g/dL (ref 13.0–17.0)
MCH: 30.9 pg (ref 26.0–34.0)
MCH: 31.2 pg (ref 26.0–34.0)
MCH: 31.3 pg (ref 26.0–34.0)
MCH: 30.9 pg (ref 26.0–34.0)
MCHC: 33.7 g/dL (ref 30.0–36.0)
MCHC: 33.3 g/dL (ref 30.0–36.0)
MCHC: 34.3 g/dL (ref 30.0–36.0)
MCHC: 33.8 g/dL (ref 30.0–36.0)
MCV: 91.5 fL (ref 78.0–100.0)
MCV: 93.7 fL (ref 78.0–100.0)
MCV: 91.4 fL (ref 78.0–100.0)
MCV: 91.5 fL (ref 78.0–100.0)
PLATELETS: 178 10*3/uL (ref 150–400)
Platelets: 188 10*3/uL (ref 150–400)
Platelets: 156 10*3/uL (ref 150–400)
Platelets: 162 10*3/uL (ref 150–400)
RBC: 4.47 MIL/uL (ref 4.22–5.81)
RBC: 4.59 MIL/uL (ref 4.22–5.81)
RBC: 4.31 MIL/uL (ref 4.22–5.81)
RBC: 4.34 MIL/uL (ref 4.22–5.81)
RDW: 15.4 % (ref 11.5–15.5)
RDW: 15.5 % (ref 11.5–15.5)
RDW: 15.4 % (ref 11.5–15.5)
RDW: 15.3 % (ref 11.5–15.5)
WBC: 11.2 10*3/uL — ABNORMAL HIGH (ref 4.0–10.5)
WBC: 11.6 10*3/uL — ABNORMAL HIGH (ref 4.0–10.5)
WBC: 8.3 10*3/uL (ref 4.0–10.5)
WBC: 9.3 10*3/uL (ref 4.0–10.5)

## 2015-01-08 LAB — HEPARIN LEVEL (UNFRACTIONATED)
HEPARIN UNFRACTIONATED: 0.3 [IU]/mL (ref 0.30–0.70)
HEPARIN UNFRACTIONATED: 0.36 [IU]/mL (ref 0.30–0.70)
Heparin Unfractionated: 0.27 IU/mL — ABNORMAL LOW (ref 0.30–0.70)
Heparin Unfractionated: 0.3 IU/mL (ref 0.30–0.70)
Heparin Unfractionated: 0.33 IU/mL (ref 0.30–0.70)

## 2015-01-08 LAB — CARDIOLIPIN ANTIBODIES, IGG, IGM, IGA
Anticardiolipin IgA: <9 APL U/mL (ref 0–11)
Anticardiolipin IgM: <9 MPL U/mL (ref 0–12)

## 2015-01-08 LAB — BASIC METABOLIC PANEL
Anion gap: 7 (ref 5–15)
BUN: 14 mg/dL (ref 6–20)
CO2: 29 mmol/L (ref 22–32)
CREATININE: 1.02 mg/dL (ref 0.61–1.24)
Calcium: 8.7 mg/dL — ABNORMAL LOW (ref 8.9–10.3)
Chloride: 101 mmol/L (ref 101–111)
GFR calc Af Amer: >60 mL/min (ref >60)
Glucose, Bld: 114 mg/dL — ABNORMAL HIGH (ref 65–99)
Potassium: 3.9 mmol/L (ref 3.5–5.1)
SODIUM: 137 mmol/L (ref 135–145)

## 2015-01-08 LAB — PROTEIN C, TOTAL: PROTEIN C, TOTAL: 65 % — AB (ref 70–140)

## 2015-01-08 LAB — APTT
APTT: 54 s — AB (ref 24–37)
aPTT: 54 seconds — ABNORMAL HIGH (ref 24–37)

## 2015-01-08 LAB — FIBRINOGEN
FIBRINOGEN: 773 mg/dL — AB (ref 204–475)
FIBRINOGEN: 289 mg/dL (ref 204–475)
FIBRINOGEN: 182 mg/dL — AB (ref 204–475)

## 2015-01-08 LAB — PROTIME-INR
INR: 1.2 (ref 0.00–1.49)
PROTHROMBIN TIME: 15.4 s — AB (ref 11.6–15.2)

## 2015-01-08 LAB — MRSA PCR SCREENING: MRSA by PCR: NEGATIVE

## 2015-01-08 MED ORDER — SODIUM CHLORIDE 0.9 % IJ SOLN: 3.0000 mL | INTRAMUSCULAR | Status: DC | PRN

## 2015-01-08 MED ORDER — MORPHINE SULFATE 4 MG/ML IJ SOLN: 5.0000 mg | INTRAMUSCULAR | Status: DC | PRN

## 2015-01-08 MED ORDER — METHYLPREDNISOLONE SODIUM SUCC 40 MG IJ SOLR
20.0000 mg | Freq: Every day | INTRAMUSCULAR | Status: AC
  Administered 2015-01-08: 20 mg via INTRAVENOUS
  Filled 2015-01-08: qty 0.5

## 2015-01-08 MED ORDER — SODIUM CHLORIDE 0.9 % IV SOLN
INTRAVENOUS | Status: DC
  Administered 2015-01-08: 13:00:00 via INTRAVENOUS
  Filled 2015-01-08 (×2): qty 500

## 2015-01-08 MED ORDER — MIDAZOLAM HCL 2 MG/2ML IJ SOLN
INTRAMUSCULAR | Status: AC
  Filled 2015-01-08: qty 6

## 2015-01-08 MED ORDER — SODIUM CHLORIDE 0.9 % IV SOLN: 250.0000 mL | INTRAVENOUS | Status: DC | PRN

## 2015-01-08 MED ORDER — SODIUM CHLORIDE 0.9 % IV SOLN
INTRAVENOUS | Status: AC
  Filled 2015-01-08: qty 20

## 2015-01-08 MED ORDER — MIDAZOLAM HCL 2 MG/2ML IJ SOLN: 1.0000 mg | INTRAMUSCULAR | Status: DC | PRN

## 2015-01-08 MED ORDER — COLCHICINE 0.6 MG PO TABS
0.6000 mg | ORAL_TABLET | Freq: Every day | ORAL | Status: DC
  Filled 2015-01-08: qty 1

## 2015-01-08 MED ORDER — MIDAZOLAM HCL 2 MG/2ML IJ SOLN
INTRAMUSCULAR | Status: AC | PRN
  Administered 2015-01-08 (×2): 1 mg via INTRAVENOUS
  Administered 2015-01-08: 2 mg via INTRAVENOUS

## 2015-01-08 MED ORDER — FENTANYL CITRATE (PF) 100 MCG/2ML IJ SOLN
INTRAMUSCULAR | Status: AC | PRN
  Administered 2015-01-08 (×3): 50 ug via INTRAVENOUS
  Administered 2015-01-08: 100 ug via INTRAVENOUS

## 2015-01-08 MED ORDER — IOHEXOL 300 MG/ML  SOLN
150.0000 mL | Freq: Once | INTRAMUSCULAR | Status: AC | PRN
  Administered 2015-01-08: 80 mL via INTRAVENOUS

## 2015-01-08 MED ORDER — PANTOPRAZOLE SODIUM 40 MG PO TBEC
40.0000 mg | DELAYED_RELEASE_TABLET | Freq: Every day | ORAL | Status: DC
  Administered 2015-01-08: 40 mg via ORAL
  Filled 2015-01-08: qty 1

## 2015-01-08 MED ORDER — COLCHICINE 0.6 MG PO CAPS
0.6000 mg | ORAL_CAPSULE | Freq: Every day | ORAL | Status: DC
  Administered 2015-01-08: 0.6 mg via ORAL
  Filled 2015-01-08: qty 1

## 2015-01-08 MED ORDER — FENTANYL CITRATE (PF) 100 MCG/2ML IJ SOLN
INTRAMUSCULAR | Status: AC
  Filled 2015-01-08: qty 6

## 2015-01-08 MED ORDER — ONDANSETRON HCL 4 MG/2ML IJ SOLN: 4.0000 mg | Freq: Four times a day (QID) | INTRAMUSCULAR | Status: DC | PRN

## 2015-01-08 MED ORDER — SODIUM CHLORIDE 0.9 % IJ SOLN
3.0000 mL | Freq: Two times a day (BID) | INTRAMUSCULAR | Status: DC
  Administered 2015-01-08: 3 mL via INTRAVENOUS
  Administered 2015-01-10: 10 mL via INTRAVENOUS

## 2015-01-08 MED ORDER — LIDOCAINE HCL 1 % IJ SOLN
INTRAMUSCULAR | Status: AC
  Filled 2015-01-08: qty 20

## 2015-01-08 MED ORDER — PROBENECID 500 MG PO TABS
500.0000 mg | ORAL_TABLET | Freq: Every day | ORAL | Status: DC
  Administered 2015-01-08: 500 mg via ORAL
  Filled 2015-01-08 (×2): qty 1

## 2015-01-08 MED ORDER — ALTEPLASE 30 MG/30 ML FOR INTERV. RAD
1.0000 mg | INTRA_ARTERIAL | Status: DC | PRN
  Filled 2015-01-08: qty 30

## 2015-01-08 NOTE — Progress Notes (Signed)
ANTICOAGULATION CONSULT NOTE - Follow Up Consult  Pharmacy Consult for Heparin  Indication: pulmonary embolus and DVT  No Known Allergies  Patient Measurements: Height: 5\' 10"  (177.8 cm) Weight: 222 lb 3.6 oz (100.8 kg) IBW/kg (Calculated) : 73  Vital Signs: Temp: 97.1 F (36.2 C) (08/04 2020) Temp Source: Oral (08/04 2020) BP: 117/72 mmHg (08/04 2300) Pulse Rate: 79 (08/04 2300)  Labs:  Recent Labs  01/06/15 0100  01/07/15 0333  01/08/15 0228 01/08/15 1158 01/08/15 1229 01/08/15 1742 01/08/15 2310  HGB 14.5  --  14.2  --  13.8  --  14.3 13.5 13.4  HCT 43.8  --  42.2  --  40.9  --  43.0 39.4 39.7  PLT 141*  --  145*  --  178  --  188 156 162  APTT 64*  < > 73*  --  54* 54*  --   --   --   LABPROT  --   --  16.8*  --  15.4*  --   --   --   --   INR  --   --  1.35  --  1.20  --   --   --   --   HEPARINUNFRC >2.20*  < > 0.66  < > 0.27* 0.30 0.30 0.33 0.36  CREATININE 1.19  --   --   --  1.02  --   --   --   --   < > = values in this interval not displayed.  Estimated Creatinine Clearance: 98.5 mL/min (by C-G formula based on Cr of 1.02).   Assessment: New onset DVT/PE, alteplase currently infusing with lower heparin goal   Goal of Therapy:  Heparin level 0.2-0.5 units/ml Monitor platelets by anticoagulation protocol: Yes   Plan:  -Continue heparin at 1600 units/hr -Daily CBC/HL -Monitor for bleeding -F/U end of alteplase infusion, likely ~24 hours  [name] 01/08/2015,11:45 PM

## 2015-01-08 NOTE — Progress Notes (Signed)
PULMONARY / CRITICAL CARE MEDICINE   Name: Nicholas [name] MRN: [id number] DOB: 06-08-1960    ADMISSION DATE:  01/05/2015 TODAY'S DATE: 01/08/2015  REFERRING MD :  Joya Gaskins  CHIEF COMPLAINT:  DVT/PE  INITIAL PRESENTATION: 54 yo male who presented with sudden onset of leg swelling and SOB, admitted with left leg DVT and B/L PE on 8/1.  He had R leg DVT/PE 2 months post-op after a lumbar surgery October 2015, and was treated with 7 months of Xarelto, which was d/c'ed 1 month prior to this admission. He was admitted to PCCM with bilateralPE and extensive left leg DVT for monitoring. His oxygenation and hemodynamics have remained.   Patient was initially treated with heparin gtt only per patient's preferences, but had minimal improvement and thus intervention with IR to retrieve the clots was recommended.  He denies any recent travel or periods of prolonged immobilization, hemoptysis, hx malignancy or clotting disorders. Has never had clotting disorder workup that he knows of.  STUDIES:  8/1 LE duplex > acute thrombus 8/1 Echo > LVEF 65-70%, PA pressure 34, mild LVH 8/3 Head CT > normal 8/3 CT angio chest > Bilateral pulmonary embolism but much more significant on the right side with a large central right-sided PE. Mild right ventricular heart strain with slight flattening of the left ventricle. The RV LV ratio is 0.92.  SIGNIFICANT EVENTS: 8/1 admitted with DVT/PE, started on heparin 8/3 Had minimal improvement and moved to [facility] 8/4 IR guided left lower extremity and IVC venography with mechanical thromectomy and transcatheter thrombolytic therapy. Started on tPA infusion in vein  SUBJECTIVE: Patient just back from IR. He is comfortable, has no complaints, is breathing comfortably. Denies cough, CP. Has some discomfort at surgical incision site on left leg but otherwise comfortable.  VITAL SIGNS: Temp:  [97.8 F (36.6 C)-98.6 F (37 C)] 98 F (36.7 C) (08/04 1243) Pulse Rate:  [63-129]  76 (08/04 1112) Resp:  [7-27] 7 (08/04 1112) BP: (112-167)/(61-119) 152/91 mmHg (08/04 1112) SpO2:  [89 %-100 %] 95 % (08/04 1112)    INTAKE / OUTPUT:  Intake/Output Summary (Last 24 hours) at 01/08/15 1304 Last data filed at 01/08/15 0700  Gross per 24 hour  Intake 368.53 ml  Output   2600 ml  Net -2231.47 ml    PHYSICAL EXAMINATION: General:  Healthy, overweight male, laying comfortably in bed, eating lunch Neuro:  Alert and oriented, conversing properly HEENT:  PERRL, no JVD Cardiovascular:  RRR, no m/r/g Lungs:  Diminished BS but CTABL Abdomen:  Soft, NTND, +BS Musculoskeletal:  Normal ROM Skin:  Left leg insertion site for tPA infusion clean, no draining/redness  LABS:  CBC  Recent Labs Lab 01/07/15 0333 01/08/15 0228 01/08/15 1229  WBC 8.1 11.2* 11.6*  HGB 14.2 13.8 14.3  HCT 42.2 40.9 43.0  PLT 145* 178 188   Coag's  Recent Labs Lab 01/05/15 1509  01/07/15 0333 01/08/15 0228 01/08/15 1158  APTT 29  < > 73* 54* 54*  INR 1.19  --  1.35 1.20  --   < > = values in this interval not displayed. BMET  Recent Labs Lab 01/05/15 1509 01/06/15 0100 01/08/15 0228  NA 140 138 137  K 4.3 3.9 3.9  CL 104 104 101  CO2 '26 26 29  ' BUN '13 13 14  ' CREATININE 1.14 1.19 1.02  GLUCOSE 97 97 114*   Electrolytes  Recent Labs Lab 01/05/15 1509 01/06/15 0100 01/08/15 0228  CALCIUM 9.1 8.7* 8.7*  MG  --  1.7  --   PHOS  --  2.9  --    Sepsis Markers No results for input(s): LATICACIDVEN, PROCALCITON, O2SATVEN in the last 168 hours. ABG No results for input(s): PHART, PCO2ART, PO2ART in the last 168 hours. Liver Enzymes  Recent Labs Lab 01/05/15 1509  AST 35  ALT 41  ALKPHOS 42  BILITOT 1.1  ALBUMIN 3.4*   Cardiac Enzymes  Recent Labs Lab 01/05/15 2050  TROPONINI <0.03   Glucose  Recent Labs Lab 01/05/15 1621 01/05/15 1959 01/07/15 1237  GLUCAP 77 98 181*    Imaging Ct Head Wo Contrast  01/07/2015   CLINICAL DATA:  Headache and  temporal area of bilaterally 4 days. Blood clot in left lower extremity with swelling and pain.  EXAM: CT HEAD WITHOUT CONTRAST  TECHNIQUE: Contiguous axial images were obtained from the base of the skull through the vertex without intravenous contrast.  COMPARISON:  None.  FINDINGS: Ventricles, cisterns and other CSF spaces are normal. There is no mass, mass effect, shift of midline structures or acute hemorrhage. No evidence of acute infarction. Remaining bones and soft tissues are within normal.  IMPRESSION: Normal head CT.   Electronically Signed   By: Marin Olp M.D.   On: 01/07/2015 18:37     ASSESSMENT / PLAN:  PULMONARY A: Bilateral PE from left leg DVT Hx of prior provoked PE (post-op) Hx of Xarelto use, discontinued 1 month ago At risk for rt infarct P:   Had minimal improvement on heparin so proceeded with IR guided tPA infusion into left leg vein today Treating PE with heparin gtt and tPA infusion Should transition back to Xarelto once discharged, likely needs lifelong See heme pcxr in am   CARDIOVASCULAR A:  Hx HTN P:  Restart Losartan as BP tolerates Monitor   RENAL A:   No acute issues P:   Monitor fluid status Allow pos balance  GASTROINTESTINAL A:   No acute issues P:   PPI On clear liquid diet, advance   HEMATOLOGIC A:   Hypercoaguable state? Panel sent P:  Likely lifelong anticoagulant patient with unprovoked DVT/PE On heparin gtt, tPA vein infusion Fibrinogen elevated 773 INR 1.2 Assess acquired risk, over age 76 kinda Ensure ordered- ana, [place] [address] gene mutation, cancer risk history, homocysteine level, factor 5 leiden hemooccult stool   INFECTIOUS A:   No acute issues P:   Monitor WBC, fever curve  ENDOCRINE A:   Hyperglycemia  P:   SSI  NEUROLOGIC A:   Pain in leg Bitemporal headache, improved today P:   Morphine PRN Head CT 8/3 ruled out any acute process   FAMILY  - Updates:   - Inter-disciplinary family meet  or Palliative Care meeting due by:  8/11   Lavon Paganini. Titus Mould, MD, [place] Pgr: [facility]  Pulmonary and [place] Pager: [phone]  01/08/2015, 1:04 PM

## 2015-01-08 NOTE — Procedures (Signed)
Interventional Radiology Procedure Note  Procedure:  Left lower extremity and IVC venography with mechanical thrombectomy and transcatheter thrombolytic therapy   Complications: None  Estimated Blood Loss: <25 mL  Findings:  Left posterior tibial vein accessed in distal calf.  Occlusive DVT extends from tibial veins of distal calf up to femoral vein in mid to upper thigh.  Angiojet performed with diluted tPA solution.  Infusion catheter placed and will be infused at a rate of 1 mg tPA/hr overnight.  Venetia Night. Kathlene Cote, M.D Pager:  [phone]

## 2015-01-08 NOTE — Progress Notes (Signed)
ANTICOAGULATION CONSULT NOTE - Follow Up Consult  Pharmacy Consult for heparin Indication: pulmonary embolus and DVT   Labs:  Recent Labs  01/05/15 1509 01/05/15 2050 01/06/15 0100  01/06/15 1811 01/07/15 0333 01/07/15 1125 01/08/15 0228  HGB 14.6  --  14.5  --   --  14.2  --  13.8  HCT 44.2  --  43.8  --   --  42.2  --  40.9  PLT 122*  --  141*  --   --  145*  --  178  APTT 29  --  64*  < > 60* 73*  --  54*  LABPROT 15.3*  --   --   --   --  16.8*  --  15.4*  INR 1.19  --   --   --   --  1.35  --  1.20  HEPARINUNFRC  --   --  >2.20*  < >  --  0.66 0.55 0.27*  CREATININE 1.14  --  1.19  --   --   --   --  1.02  TROPONINI  --  <0.03  --   --   --   --   --   --   < > = values in this interval not displayed.   Assessment: 54yo male now subtherapeutic on heparin after two PTTs at goal.  Goal of Therapy:  Heparin level 0.3-0.7 units/ml aPTT 66-102 seconds   Plan:  Will increase heparin gtt by 2 units/kg/hr to 1500 units/hr and check level/PTT in 6hr.  Wynona Neat, PharmD, BCPS  01/08/2015,4:25 AM

## 2015-01-08 NOTE — Progress Notes (Addendum)
ANTICOAGULATION CONSULT NOTE - Follow Up Consult  Pharmacy Consult for heparin Indication: pulmonary embolus and DVT   Labs:  Recent Labs  01/05/15 1509 01/05/15 2050 01/06/15 0100  01/07/15 0333  01/08/15 0228 01/08/15 1158 01/08/15 1229  HGB 14.6  --  14.5  --  14.2  --  13.8  --  14.3  HCT 44.2  --  43.8  --  42.2  --  40.9  --  43.0  PLT 122*  --  141*  --  145*  --  178  --  188  APTT 29  --  64*  < > 73*  --  54* 54*  --   LABPROT 15.3*  --   --   --  16.8*  --  15.4*  --   --   INR 1.19  --   --   --  1.35  --  1.20  --   --   HEPARINUNFRC  --   --  >2.20*  < > 0.66  < > 0.27* 0.30 0.30  CREATININE 1.14  --  1.19  --   --   --  1.02  --   --   TROPONINI  --  <0.03  --   --   --   --   --   --   --   < > = values in this interval not displayed.   Assessment: 54yo male with new PE and extensive left lower extremity DVT now therapeutic on heparin but on lower end of goal. Patient currently has an infusion catheter in place with tPA being infused at a rate of 1 mg/hr x 24 hours. H/H and Plt wnl. No s/s of bleeding noted   Goal of Therapy:  Heparin level 0.3-0.5 units/ml while on tPA  aPTT 66-102 seconds   Plan:  -Will increase heparin gtt by 1 units/kg/hr to 1600 units/hr and check level/PTT in 6hr. -Monitor for s/s of bleeding   Albertina Parr, PharmD., BCPS Clinical Pharmacist Pager [phone]   ADDENDUM:  Q6H HL came back therapeutic at 0.33 after slight increase of dose.  Plan: Continue heparin gtt at 1600 units/hr Check Q6H HL (next at 2345) Monitor daily HL, CBC, s/s of bleed

## 2015-01-08 NOTE — Progress Notes (Signed)
Pt arrived to 2M01 with TPA not infusing. Pharmacy preparing during this time. Began TPA 1308.

## 2015-01-09 ENCOUNTER — Inpatient Hospital Stay (HOSPITAL_COMMUNITY): Payer: BLUE CROSS/BLUE SHIELD

## 2015-01-09 LAB — BASIC METABOLIC PANEL
Anion gap: 8 (ref 5–15)
BUN: 14 mg/dL (ref 6–20)
CHLORIDE: 101 mmol/L (ref 101–111)
CO2: 28 mmol/L (ref 22–32)
CREATININE: 1.05 mg/dL (ref 0.61–1.24)
Calcium: 8.4 mg/dL — ABNORMAL LOW (ref 8.9–10.3)
GFR calc Af Amer: >60 mL/min (ref >60)
GFR calc non Af Amer: >60 mL/min (ref >60)
Glucose, Bld: 120 mg/dL — ABNORMAL HIGH (ref 65–99)
Potassium: 4.1 mmol/L (ref 3.5–5.1)
SODIUM: 137 mmol/L (ref 135–145)

## 2015-01-09 LAB — SEDIMENTATION RATE: Sed Rate: 5 mm/hr (ref 0–16)

## 2015-01-09 LAB — CBC
HCT: 39.9 % (ref 39.0–52.0)
Hemoglobin: 13.2 g/dL (ref 13.0–17.0)
MCH: 30.4 pg (ref 26.0–34.0)
MCHC: 33.1 g/dL (ref 30.0–36.0)
MCV: 91.9 fL (ref 78.0–100.0)
PLATELETS: 167 10*3/uL (ref 150–400)
RBC: 4.34 MIL/uL (ref 4.22–5.81)
RDW: 15.4 % (ref 11.5–15.5)
WBC: 9 10*3/uL (ref 4.0–10.5)

## 2015-01-09 LAB — HEPARIN LEVEL (UNFRACTIONATED)
HEPARIN UNFRACTIONATED: 0.42 [IU]/mL (ref 0.30–0.70)
HEPARIN UNFRACTIONATED: 0.4 [IU]/mL (ref 0.30–0.70)
Heparin Unfractionated: 0.38 IU/mL (ref 0.30–0.70)
Heparin Unfractionated: 0.62 IU/mL (ref 0.30–0.70)

## 2015-01-09 LAB — FIBRINOGEN
FIBRINOGEN: 171 mg/dL — AB (ref 204–475)
FIBRINOGEN: 185 mg/dL — AB (ref 204–475)
FIBRINOGEN: 227 mg/dL (ref 204–475)

## 2015-01-09 MED ORDER — FENTANYL CITRATE (PF) 100 MCG/2ML IJ SOLN
INTRAMUSCULAR | Status: AC
  Filled 2015-01-09: qty 4

## 2015-01-09 MED ORDER — FENTANYL CITRATE (PF) 100 MCG/2ML IJ SOLN
INTRAMUSCULAR | Status: AC | PRN
  Administered 2015-01-09 (×2): 50 ug via INTRAVENOUS
  Administered 2015-01-09: 100 ug via INTRAVENOUS

## 2015-01-09 MED ORDER — LIDOCAINE HCL 1 % IJ SOLN
INTRAMUSCULAR | Status: AC
Start: 2015-01-09 — End: 2015-01-10
  Filled 2015-01-09: qty 20

## 2015-01-09 MED ORDER — MIDAZOLAM HCL 2 MG/2ML IJ SOLN
INTRAMUSCULAR | Status: AC
  Filled 2015-01-09: qty 4

## 2015-01-09 MED ORDER — PANTOPRAZOLE SODIUM 40 MG PO TBEC
40.0000 mg | DELAYED_RELEASE_TABLET | Freq: Two times a day (BID) | ORAL | Status: DC
  Administered 2015-01-09 – 2015-01-11 (×4): 40 mg via ORAL
  Filled 2015-01-09 (×4): qty 1

## 2015-01-09 MED ORDER — HYDROCODONE-ACETAMINOPHEN 5-325 MG PO TABS
1.0000 | ORAL_TABLET | ORAL | Status: DC | PRN
  Administered 2015-01-10: 1 via ORAL
  Filled 2015-01-09: qty 1

## 2015-01-09 MED ORDER — METHYLPREDNISOLONE SODIUM SUCC 40 MG IJ SOLR
40.0000 mg | Freq: Two times a day (BID) | INTRAMUSCULAR | Status: DC
  Administered 2015-01-09 – 2015-01-10 (×3): 40 mg via INTRAVENOUS
  Filled 2015-01-09 (×5): qty 1

## 2015-01-09 MED ORDER — MIDAZOLAM HCL 2 MG/2ML IJ SOLN
INTRAMUSCULAR | Status: AC | PRN
  Administered 2015-01-09: 2 mg via INTRAVENOUS
  Administered 2015-01-09 (×2): 1 mg via INTRAVENOUS

## 2015-01-09 MED ORDER — IOHEXOL 300 MG/ML  SOLN
250.0000 mL | Freq: Once | INTRAMUSCULAR | Status: AC | PRN
  Administered 2015-01-09: 120 mL via INTRAVENOUS

## 2015-01-09 MED ORDER — PREDNISONE 20 MG PO TABS
20.0000 mg | ORAL_TABLET | Freq: Two times a day (BID) | ORAL | Status: DC
  Filled 2015-01-09 (×3): qty 1

## 2015-01-09 NOTE — Progress Notes (Signed)
Notified PA student Jiles Garter in regards to patient having some swelling and discomfort around the left ankle. Post Cath/Sheath Left venous, dressing clean dry and intact. Pt has pulses 2+ dorsalis pedis, posterior tibial, and femoral. Pt is able to move toes on command as well.

## 2015-01-09 NOTE — Progress Notes (Signed)
Transfer report received from [facility]M at 1800 and pt arrived to the unit at 1830 via bed with family and belongings to the side. Pt A&O, VSS, telemetry applied and verified; IV intact with heparin transfusing; LLE remains non-pitting edematous, dsg clean and intact with no stain, bleeding or drainage to lower leg dsg. Pt oriented to the unit and room; call light within reach, family remains at bedside. Will closely monitor. Francis Gaines Hoyt Leanos RN.

## 2015-01-09 NOTE — Progress Notes (Addendum)
PULMONARY / CRITICAL CARE MEDICINE   Name: Nicholas [name] MRN: [id number] DOB: 07-30-1960    ADMISSION DATE:  01/05/2015 TODAY'S DATE: 01/08/2015  REFERRING MD :  Joya Gaskins  CHIEF COMPLAINT:  DVT/PE  INITIAL PRESENTATION: 54 yo male who presented with sudden onset of leg swelling and SOB, admitted with left leg DVT and B/L PE on 8/1.  He had R leg DVT/PE 2 months post-op after a lumbar surgery October 2015, and was treated with 7 months of Xarelto, which was d/c'ed 1 month prior to this admission. He was admitted to PCC[facility] with bilateralPE and extensive left leg DVT for monitoring. His oxygenation and hemodynamics have remained.   Patient was initially treated with heparin gtt only per patient's preferences, but had minimal improvement and thus intervention with IR to retrieve the clots was recommended.  He denies any recent travel or periods of prolonged immobilization, hemoptysis, hx malignancy or clotting disorders. Has never had clotting disorder workup that he knows of.  STUDIES:  8/1 LE duplex > acute thrombus 8/1 Echo > LVEF 65-70%, PA pressure 34, mild LVH 8/3 Head CT > normal 8/3 CT angio chest > Bilateral pulmonary embolism but much more significant on the right side with a large central right-sided PE. Mild right ventricular heart strain with slight flattening of the left ventricle. The RV LV ratio is 0.92.  SIGNIFICANT EVENTS: 8/1 admitted with DVT/PE, started on heparin 8/3 Had minimal improvement and moved to [facility] 8/4 IR guided left lower extremity and IVC venography with mechanical thromectomy and transcatheter thrombolytic therapy. Started on tPA infusion in vein  SUBJECTIVE: remained on lytics throughout night, no resp symptoms  VITAL SIGNS: Temp:  [97.1 F (36.2 C)-99.6 F (37.6 C)] 98.1 F (36.7 C) (08/05 0843) Pulse Rate:  [62-105] 71 (08/05 1000) Resp:  [7-24] 20 (08/05 1000) BP: (111-152)/(66-97) 127/80 mmHg (08/05 1000) SpO2:  [92 %-100 %] 96 % (08/05 1000)     INTAKE / OUTPUT:  Intake/Output Summary (Last 24 hours) at 01/09/15 1100 Last data filed at 01/09/15 1000  Gross per 24 hour  Intake 1437.46 ml  Output   2200 ml  Net -762.54 ml    PHYSICAL EXAMINATION: General:  Healthy, overweight male, laying comfortably  Neuro:  Alert and oriented, nonfocal HEENT:  PERRL, no JVD Cardiovascular:  RRR, no m/r/g Lungs:  Diminished BS, but clear Abdomen:  Soft, NTND, +BS Musculoskeletal:  Normal ROM Skin:  Left leg insertion site for tPA infusion clean  LABS:  CBC  Recent Labs Lab 01/08/15 1742 01/08/15 2310 01/09/15 0500  WBC 8.3 9.3 9.0  HGB 13.5 13.4 13.2  HCT 39.4 39.7 39.9  PLT 156 162 167   Coag's  Recent Labs Lab 01/05/15 1509  01/07/15 0333 01/08/15 0228 01/08/15 1158  APTT 29  < > 73* 54* 54*  INR 1.19  --  1.35 1.20  --   < > = values in this interval not displayed. BMET  Recent Labs Lab 01/06/15 0100 01/08/15 0228 01/09/15 0500  NA 138 137 137  K 3.9 3.9 4.1  CL 104 101 101  CO2 _0 BUN _1 CREATININE 1.19 1.02 1.05  GLUCOSE 97 114* 120*   Electrolytes  Recent Labs Lab 01/06/15 0100 01/08/15 0228 01/09/15 0500  CALCIUM 8.7* 8.7* 8.4*  MG 1.7  --   --   PHOS 2.9  --   --    Sepsis Markers No results for input(s): LATICACIDVEN, PROCALCITON, O2SATVEN in the last  168 hours. ABG No results for input(s): PHART, PCO2ART, PO2ART in the last 168 hours. Liver Enzymes  Recent Labs Lab 01/05/15 1509  AST 35  ALT 41  ALKPHOS 42  BILITOT 1.1  ALBUMIN 3.4*   Cardiac Enzymes  Recent Labs Lab 01/05/15 2050  TROPONINI <0.03   Glucose  Recent Labs Lab 01/05/15 1621 01/05/15 1959 01/07/15 1237  GLUCAP 77 98 181*    Imaging Ir Veno/ext/uni Left  01/09/2015   CLINICAL DATA:  Left lower leg swelling and pain. Extensive tibial, femoral and popliteal DVT on sonography.  EXAM: 1. ULTRASOUND GUIDANCE FOR VASCULAR ACCESS OF THE LEFT POSTERIOR TIBIAL VEIN 2. LEFT LOWER EXTREMITY  VENOGRAPHY 3. IVC VENOGRAPHY 4. MECHANICAL THROMBECTOMY OF LEFT LOWER EXTREMITY DEEP VEINS 5. TRANSCATHETER INFUSION VENOUS THROMBOLYTIC THERAPY FOR TREATMENT OF LEFT LOWER EXTREMITY DEEP VEIN THROMBOSIS.  ANESTHESIA/SEDATION: Sedation:  4 mg IV Versed sec, 300 mcg IV fentanyl  Total Moderate Sedation Time:  60 minutes.  MEDICATIONS: Lidocaine 1% subcutaneous  CONTRAST:  21mL OMNIPAQUE IOHEXOL 300 MG/ML  SOLN  FLUOROSCOPY TIME:  6 minutes and 30 seconds.  PROCEDURE: The procedure, risks (including but not limited to bleeding, infection, organ damage , pulmonary embolism), benefits, and alternatives were explained to the patient. Questions regarding the procedure were encouraged and answered. The patient understands and consents to the procedure.  Patient was placed prone on the procedure table. Left leg prepped and draped with Betadine in usual sterile fashion. Maximal barrier sterile technique was utilized including caps, mask, sterile gowns, sterile gloves, sterile drape, hand hygiene and skin antiseptic. After time-out, moderate sedation was initiated.  After the skin was infiltrated with lidocaine, the left posterior tibial vein was accessed under ultrasound guidance with a 21 gauge micropuncture set at the ankle level. Venography was performed. The micropuncture dilator was exchanged for a 6 [place] vascular sheath, through which a 5 [place] catheter was advanced for left lower extremity venography. The catheter was further advanced over a guidewire and pelvic iliac vein venography performed. The catheter was further advanced into the IVC and additional IVC venography performed.  The 5 French catheter was exchanged over a guidewire for the 6 [place] Angiojet thrombectomy device. Mechanical thrombectomy was performed of the posterior tibial, popliteal and femoral veins utilizing a dilute solution of 20 mg tPA in 1,000 mL saline. Additional venography was performed with contrast injection.  After removal of the  Angiojet device, a 90 cm total length by 50 cm infusion length 5 French multi side-hole infusion catheter was advanced over a guidewire. Catheter positioning was confirmed by fluoroscopy. Distal occlusion wire was placed and infusion of tPA begun at a dose of 1 milligram/hour. Concomitant IV heparin will be continued. Angiographic followup will be performed in approximately 24 hours.  COMPLICATIONS: None  FINDINGS: Initial left lower extremity venography demonstrated occlusive DVT extending from posterior tibial veins through the popliteal and femoral veins. Occlusive thrombus terminates in the proximal thigh. Venography demonstrates a normally patent common femoral and external iliac vein. The common iliac vein demonstrates chronic appearing stenosis and web formation consistent with chronic May Thurner syndrome and/or congenital interruption. There is outflow via a prominent pelvic collateral vein that empties into the contralateral right external iliac vein. The IVC is normally patent.  After extensive Angiojet thrombectomy with 227 mL of the diluted tPA solution, some improved patency of the veins was noted. However, there is clearly a more chronic component of adherent thrombus, especially in the femoral vein of the thigh. For this reason,  infusion thrombolytic therapy was started via an infusion catheter with tPA infusion begun at a dose of 1 milligram/hour. The infusion catheter extends from the lower calf up to the mid thigh. This will be infused overnight follow-up angiogram performed in roughly 24 hours. At that time, patency of the central common iliac vein stenosis will be reassessed for potential need of further intervention (angioplasty and/or intravascular stent placement).  IMPRESSION: 1. Extensive occlusive deep venous thrombus through the left posterior tibial, popliteal and femoral veins. 2. After mechanical thrombectomy with diluted tPA, significant thrombus remain present. Thrombolytic infusion  catheter was placed via posterior tibial access and infusion of tPA begun at 1 milligram/hour. This will be continued overnight. 3. Venography demonstrates chronic appearing stenosis and interruption of the central left common iliac vein with prominent collateral outflow into the contralateral right external iliac vein. This appears consistent with chronic stenosis from May Thurner syndrome and/or congenital interruption/duplication. This will be re-assessed after thrombolytic therapy to determine whether angioplasty and/or stent placement may be needed to improve iliac venous outflow.   Electronically Signed   By: Aletta Edouard M.D.   On: 01/09/2015 08:07   Ir Venocavagram Ivc  01/09/2015   CLINICAL DATA:  Left lower leg swelling and pain. Extensive tibial, femoral and popliteal DVT on sonography.  EXAM: 1. ULTRASOUND GUIDANCE FOR VASCULAR ACCESS OF THE LEFT POSTERIOR TIBIAL VEIN 2. LEFT LOWER EXTREMITY VENOGRAPHY 3. IVC VENOGRAPHY 4. MECHANICAL THROMBECTOMY OF LEFT LOWER EXTREMITY DEEP VEINS 5. TRANSCATHETER INFUSION VENOUS THROMBOLYTIC THERAPY FOR TREATMENT OF LEFT LOWER EXTREMITY DEEP VEIN THROMBOSIS.  ANESTHESIA/SEDATION: Sedation:  4 mg IV Versed sec, 300 mcg IV fentanyl  Total Moderate Sedation Time:  60 minutes.  MEDICATIONS: Lidocaine 1% subcutaneous  CONTRAST:  89m OMNIPAQUE IOHEXOL 300 MG/ML  SOLN  FLUOROSCOPY TIME:  6 minutes and 30 seconds.  PROCEDURE: The procedure, risks (including but not limited to bleeding, infection, organ damage , pulmonary embolism), benefits, and alternatives were explained to the patient. Questions regarding the procedure were encouraged and answered. The patient understands and consents to the procedure.  Patient was placed prone on the procedure table. Left leg prepped and draped with Betadine in usual sterile fashion. Maximal barrier sterile technique was utilized including caps, mask, sterile gowns, sterile gloves, sterile drape, hand hygiene and skin antiseptic.  After time-out, moderate sedation was initiated.  After the skin was infiltrated with lidocaine, the left posterior tibial vein was accessed under ultrasound guidance with a 21 gauge micropuncture set at the ankle level. Venography was performed. The micropuncture dilator was exchanged for a 6 [place] sheath, through which a 5 [place]athe[place] was advanced for left lower extremity venography. The catheter was further advanced over a guidewire and pelvic iliac vein venography performed. The catheter was further advanced into the IVC and additional IVC venography performed.  The 5 French catheter was exchanged over a guidewire for the 6 [place] thrombectomy device. Mechanical thrombectomy was performed of the posterior tibial, popliteal and femoral veins utilizing a dilute solution of 20 mg tPA in 1,000 mL saline. Additional venography was performed with contrast injection.  After removal of the Angiojet device, a 90 cm total length by 50 cm infusion length 5 French multi side-hole infusion catheter was advanced over a guidewire. Catheter positioning was confirmed by fluoroscopy. Distal occlusion wire was placed and infusion of tPA begun at a dose of 1 milligram/hour. Concomitant IV heparin will be continued. Angiographic followup will be performed in approximately 24 hours.  COMPLICATIONS: None  FINDINGS: Initial left lower extremity venography demonstrated occlusive DVT extending from posterior tibial veins through the popliteal and femoral veins. Occlusive thrombus terminates in the proximal thigh. Venography demonstrates a normally patent common femoral and external iliac vein. The common iliac vein demonstrates chronic appearing stenosis and web formation consistent with chronic May Thurner syndrome and/or congenital interruption. There is outflow via a prominent pelvic collateral vein that empties into the contralateral right external iliac vein. The IVC is normally patent.  After extensive  Angiojet thrombectomy with 227 mL of the diluted tPA solution, some improved patency of the veins was noted. However, there is clearly a more chronic component of adherent thrombus, especially in the femoral vein of the thigh. For this reason, infusion thrombolytic therapy was started via an infusion catheter with tPA infusion begun at a dose of 1 milligram/hour. The infusion catheter extends from the lower calf up to the mid thigh. This will be infused overnight follow-up angiogram performed in roughly 24 hours. At that time, patency of the central common iliac vein stenosis will be reassessed for potential need of further intervention (angioplasty and/or intravascular stent placement).  IMPRESSION: 1. Extensive occlusive deep venous thrombus through the left posterior tibial, popliteal and femoral veins. 2. After mechanical thrombectomy with diluted tPA, significant thrombus remain present. Thrombolytic infusion catheter was placed via posterior tibial access and infusion of tPA begun at 1 milligram/hour. This will be continued overnight. 3. Venography demonstrates chronic appearing stenosis and interruption of the central left common iliac vein with prominent collateral outflow into the contralateral right external iliac vein. This appears consistent with chronic stenosis from May Thurner syndrome and/or congenital interruption/duplication. This will be re-assessed after thrombolytic therapy to determine whether angioplasty and/or stent placement may be needed to improve iliac venous outflow.   Electronically Signed   By: Aletta Edouard M.D.   On: 01/09/2015 08:07   Ir Thrombect Veno Mech Mod Sed  01/09/2015   CLINICAL DATA:  Left lower leg swelling and pain. Extensive tibial, femoral and popliteal DVT on sonography.  EXAM: 1. ULTRASOUND GUIDANCE FOR VASCULAR ACCESS OF THE LEFT POSTERIOR TIBIAL VEIN 2. LEFT LOWER EXTREMITY VENOGRAPHY 3. IVC VENOGRAPHY 4. MECHANICAL THROMBECTOMY OF LEFT LOWER EXTREMITY DEEP  VEINS 5. TRANSCATHETER INFUSION VENOUS THROMBOLYTIC THERAPY FOR TREATMENT OF LEFT LOWER EXTREMITY DEEP VEIN THROMBOSIS.  ANESTHESIA/SEDATION: Sedation:  4 mg IV Versed sec, 300 mcg IV fentanyl  Total Moderate Sedation Time:  60 minutes.  MEDICATIONS: Lidocaine 1% subcutaneous  CONTRAST:  14m OMNIPAQUE IOHEXOL 300 MG/ML  SOLN  FLUOROSCOPY TIME:  6 minutes and 30 seconds.  PROCEDURE: The procedure, risks (including but not limited to bleeding, infection, organ damage , pulmonary embolism), benefits, and alternatives were explained to the patient. Questions regarding the procedure were encouraged and answered. The patient understands and consents to the procedure.  Patient was placed prone on the procedure table. Left leg prepped and draped with Betadine in usual sterile fashion. Maximal barrier sterile technique was utilized including caps, mask, sterile gowns, sterile gloves, sterile drape, hand hygiene and skin antiseptic. After time-out, moderate sedation was initiated.  After the skin was infiltrated with lidocaine, the left posterior tibial vein was accessed under ultrasound guidance with a 21 gauge micropuncture set at the ankle level. Venography was performed. The micropuncture dilator was exchanged for a 6 [place] sheath, through which a 5 [place]atheter was advanced for left lower extremity venography. The catheter was further advanced over a guidewire and pelvic iliac vein venography performed. The catheter  was further advanced into the IVC and additional IVC venography performed.  The 5 French catheter was exchanged over a guidewire for the 6 [place] Angiojet thrombectomy device. Mechanical thrombectomy was performed of the posterior tibial, popliteal and femoral veins utilizing a dilute solution of 20 mg tPA in 1,000 mL saline. Additional venography was performed with contrast injection.  After removal of the Angiojet device, a 90 cm total length by 50 cm infusion length 5 French multi side-hole  infusion catheter was advanced over a guidewire. Catheter positioning was confirmed by fluoroscopy. Distal occlusion wire was placed and infusion of tPA begun at a dose of 1 milligram/hour. Concomitant IV heparin will be continued. Angiographic followup will be performed in approximately 24 hours.  COMPLICATIONS: None  FINDINGS: Initial left lower extremity venography demonstrated occlusive DVT extending from posterior tibial veins through the popliteal and femoral veins. Occlusive thrombus terminates in the proximal thigh. Venography demonstrates a normally patent common femoral and external iliac vein. The common iliac vein demonstrates chronic appearing stenosis and web formation consistent with chronic May Thurner syndrome and/or congenital interruption. There is outflow via a prominent pelvic collateral vein that empties into the contralateral right external iliac vein. The IVC is normally patent.  After extensive Angiojet thrombectomy with 227 mL of the diluted tPA solution, some improved patency of the veins was noted. However, there is clearly a more chronic component of adherent thrombus, especially in the femoral vein of the thigh. For this reason, infusion thrombolytic therapy was started via an infusion catheter with tPA infusion begun at a dose of 1 milligram/hour. The infusion catheter extends from the lower calf up to the mid thigh. This will be infused overnight follow-up angiogram performed in roughly 24 hours. At that time, patency of the central common iliac vein stenosis will be reassessed for potential need of further intervention (angioplasty and/or intravascular stent placement).  IMPRESSION: 1. Extensive occlusive deep venous thrombus through the left posterior tibial, popliteal and femoral veins. 2. After mechanical thrombectomy with diluted tPA, significant thrombus remain present. Thrombolytic infusion catheter was placed via posterior tibial access and infusion of tPA begun at 1  milligram/hour. This will be continued overnight. 3. Venography demonstrates chronic appearing stenosis and interruption of the central left common iliac vein with prominent collateral outflow into the contralateral right external iliac vein. This appears consistent with chronic stenosis from May Thurner syndrome and/or congenital interruption/duplication. This will be re-assessed after thrombolytic therapy to determine whether angioplasty and/or stent placement may be needed to improve iliac venous outflow.   Electronically Signed   By: Aletta Edouard M.D.   On: 01/09/2015 08:07   Ir US Guide Vasc Access Left  01/09/2015   CLINICAL DATA:  Left lower leg swelling and pain. Extensive tibial, femoral and popliteal DVT on sonography.  EXAM: 1. ULTRASOUND GUIDANCE FOR VASCULAR ACCESS OF THE LEFT POSTERIOR TIBIAL VEIN 2. LEFT LOWER EXTREMITY VENOGRAPHY 3. IVC VENOGRAPHY 4. MECHANICAL THROMBECTOMY OF LEFT LOWER EXTREMITY DEEP VEINS 5. TRANSCATHETER INFUSION VENOUS THROMBOLYTIC THERAPY FOR TREATMENT OF LEFT LOWER EXTREMITY DEEP VEIN THROMBOSIS.  ANESTHESIA/SEDATION: Sedation:  4 mg IV Versed sec, 300 mcg IV fentanyl  Total Moderate Sedation Time:  60 minutes.  MEDICATIONS: Lidocaine 1% subcutaneous  CONTRAST:  75m OMNIPAQUE IOHEXOL 300 MG/ML  SOLN  FLUOROSCOPY TIME:  6 minutes and 30 seconds.  PROCEDURE: The procedure, risks (including but not limited to bleeding, infection, organ damage , pulmonary embolism), benefits, and alternatives were explained to the patient. Questions regarding the procedure were  encouraged and answered. The patient understands and consents to the procedure.  Patient was placed prone on the procedure table. Left leg prepped and draped with Betadine in usual sterile fashion. Maximal barrier sterile technique was utilized including caps, mask, sterile gowns, sterile gloves, sterile drape, hand hygiene and skin antiseptic. After time-out, moderate sedation was initiated.  After the skin was  infiltrated with lidocaine, the left posterior tibial vein was accessed under ultrasound guidance with a 21 gauge micropuncture set at the ankle level. Venography was performed. The micropuncture dilator was exchanged for a 6 [place] vascular sheath, through which a 5 [place] catheter was advanced for left lower extremity venography. The catheter was further advanced over a guidewire and pelvic iliac vein venography performed. The catheter was further advanced into the IVC and additional IVC venography performed.  The 5 French catheter was exchanged over a guidewire for the 6 [place] Angiojet thrombectomy device. Mechanical thrombectomy was performed of the posterior tibial, popliteal and femoral veins utilizing a dilute solution of 20 mg tPA in 1,000 mL saline. Additional venography was performed with contrast injection.  After removal of the Angiojet device, a 90 cm total length by 50 cm infusion length 5 French multi side-hole infusion catheter was advanced over a guidewire. Catheter positioning was confirmed by fluoroscopy. Distal occlusion wire was placed and infusion of tPA begun at a dose of 1 milligram/hour. Concomitant IV heparin will be continued. Angiographic followup will be performed in approximately 24 hours.  COMPLICATIONS: None  FINDINGS: Initial left lower extremity venography demonstrated occlusive DVT extending from posterior tibial veins through the popliteal and femoral veins. Occlusive thrombus terminates in the proximal thigh. Venography demonstrates a normally patent common femoral and external iliac vein. The common iliac vein demonstrates chronic appearing stenosis and web formation consistent with chronic May Thurner syndrome and/or congenital interruption. There is outflow via a prominent pelvic collateral vein that empties into the contralateral right external iliac vein. The IVC is normally patent.  After extensive Angiojet thrombectomy with 227 mL of the diluted tPA solution, some  improved patency of the veins was noted. However, there is clearly a more chronic component of adherent thrombus, especially in the femoral vein of the thigh. For this reason, infusion thrombolytic therapy was started via an infusion catheter with tPA infusion begun at a dose of 1 milligram/hour. The infusion catheter extends from the lower calf up to the mid thigh. This will be infused overnight follow-up angiogram performed in roughly 24 hours. At that time, patency of the central common iliac vein stenosis will be reassessed for potential need of further intervention (angioplasty and/or intravascular stent placement).  IMPRESSION: 1. Extensive occlusive deep venous thrombus through the left posterior tibial, popliteal and femoral veins. 2. After mechanical thrombectomy with diluted tPA, significant thrombus remain present. Thrombolytic infusion catheter was placed via posterior tibial access and infusion of tPA begun at 1 milligram/hour. This will be continued overnight. 3. Venography demonstrates chronic appearing stenosis and interruption of the central left common iliac vein with prominent collateral outflow into the contralateral right external iliac vein. This appears consistent with chronic stenosis from May Thurner syndrome and/or congenital interruption/duplication. This will be re-assessed after thrombolytic therapy to determine whether angioplasty and/or stent placement may be needed to improve iliac venous outflow.   Electronically Signed   By: Aletta Edouard M.D.   On: 01/09/2015 08:07   Dg Chest Port 1 View  01/09/2015   CLINICAL DATA:  54 year old male with bilateral pulmonary emboli. Venous thrombolysis.  Initial encounter.  EXAM: PORTABLE CHEST - 1 VIEW  COMPARISON:  Chest CTA 01/05/2015 and earlier.  FINDINGS: Portable AP semi upright view at 0503 hr. Stable lung volumes. Normal cardiac size and mediastinal contours. Visualized tracheal air column is within normal limits. No pneumothorax or  pulmonary edema. No pleural effusion or consolidation. Mild curvilinear opacity in the lingula most resembles atelectasis. No other confluent opacity.  IMPRESSION: Minor left lung base atelectasis.   Electronically Signed   By: Genevie Ann M.D.   On: 01/09/2015 07:27   Ir Infusion Thrombol Venous Initial (ms)  01/09/2015   CLINICAL DATA:  Left lower leg swelling and pain. Extensive tibial, femoral and popliteal DVT on sonography.  EXAM: 1. ULTRASOUND GUIDANCE FOR VASCULAR ACCESS OF THE LEFT POSTERIOR TIBIAL VEIN 2. LEFT LOWER EXTREMITY VENOGRAPHY 3. IVC VENOGRAPHY 4. MECHANICAL THROMBECTOMY OF LEFT LOWER EXTREMITY DEEP VEINS 5. TRANSCATHETER INFUSION VENOUS THROMBOLYTIC THERAPY FOR TREATMENT OF LEFT LOWER EXTREMITY DEEP VEIN THROMBOSIS.  ANESTHESIA/SEDATION: Sedation:  4 mg IV Versed sec, 300 mcg IV fentanyl  Total Moderate Sedation Time:  60 minutes.  MEDICATIONS: Lidocaine 1% subcutaneous  CONTRAST:  90m OMNIPAQUE IOHEXOL 300 MG/ML  SOLN  FLUOROSCOPY TIME:  6 minutes and 30 seconds.  PROCEDURE: The procedure, risks (including but not limited to bleeding, infection, organ damage , pulmonary embolism), benefits, and alternatives were explained to the patient. Questions regarding the procedure were encouraged and answered. The patient understands and consents to the procedure.  Patient was placed prone on the procedure table. Left leg prepped and draped with Betadine in usual sterile fashion. Maximal barrier sterile technique was utilized including caps, mask, sterile gowns, sterile gloves, sterile drape, hand hygiene and skin antiseptic. After time-out, moderate sedation was initiated.  After the skin was infiltrated with lidocaine, the left posterior tibial vein was accessed under ultrasound guidance with a 21 gauge micropuncture set at the ankle level. Venography was performed. The micropuncture dilator was exchanged for a 6 FP[place]vascular sheath, through which a 5 FPakistancatheter was advanced for left lower  extremity venography. The catheter was further advanced over a guidewire and pelvic iliac vein venography performed. The catheter was further advanced into the IVC and additional IVC venography performed.  The 5 French catheter was exchanged over a guidewire for the 6 FPakistanAngiojet thrombectomy device. Mechanical thrombectomy was performed of the posterior tibial, popliteal and femoral veins utilizing a dilute solution of 20 mg tPA in 1,000 mL saline. Additional venography was performed with contrast injection.  After removal of the Angiojet device, a 90 cm total length by 50 cm infusion length 5 French multi side-hole infusion catheter was advanced over a guidewire. Catheter positioning was confirmed by fluoroscopy. Distal occlusion wire was placed and infusion of tPA begun at a dose of 1 milligram/hour. Concomitant IV heparin will be continued. Angiographic followup will be performed in approximately 24 hours.  COMPLICATIONS: None  FINDINGS: Initial left lower extremity venography demonstrated occlusive DVT extending from posterior tibial veins through the popliteal and femoral veins. Occlusive thrombus terminates in the proximal thigh. Venography demonstrates a normally patent common femoral and external iliac vein. The common iliac vein demonstrates chronic appearing stenosis and web formation consistent with chronic May Thurner syndrome and/or congenital interruption. There is outflow via a prominent pelvic collateral vein that empties into the contralateral right external iliac vein. The IVC is normally patent.  After extensive Angiojet thrombectomy with 227 mL of the diluted tPA solution, some improved patency of the veins was noted.  However, there is clearly a more chronic component of adherent thrombus, especially in the femoral vein of the thigh. For this reason, infusion thrombolytic therapy was started via an infusion catheter with tPA infusion begun at a dose of 1 milligram/hour. The infusion catheter  extends from the lower calf up to the mid thigh. This will be infused overnight follow-up angiogram performed in roughly 24 hours. At that time, patency of the central common iliac vein stenosis will be reassessed for potential need of further intervention (angioplasty and/or intravascular stent placement).  IMPRESSION: 1. Extensive occlusive deep venous thrombus through the left posterior tibial, popliteal and femoral veins. 2. After mechanical thrombectomy with diluted tPA, significant thrombus remain present. Thrombolytic infusion catheter was placed via posterior tibial access and infusion of tPA begun at 1 milligram/hour. This will be continued overnight. 3. Venography demonstrates chronic appearing stenosis and interruption of the central left common iliac vein with prominent collateral outflow into the contralateral right external iliac vein. This appears consistent with chronic stenosis from May Thurner syndrome and/or congenital interruption/duplication. This will be re-assessed after thrombolytic therapy to determine whether angioplasty and/or stent placement may be needed to improve iliac venous outflow.   Electronically Signed   By: Aletta Edouard M.D.   On: 01/09/2015 08:07     ASSESSMENT / PLAN:  PULMONARY A: Bilateral PE from left leg DVT Hx of prior provoked PE (post-op) Hx of Xarelto use, discontinued 1 month ago At risk for rt infarct P:   Treating PE with heparin gtt and tPA infusion for lowers etx Should transition back to Xarelto once discharged, likely needs lifelong pcxr today , no infarct  CARDIOVASCULAR A:  Hx HTN P:  Restart Losartan as BP tolerates Monitor tele  RENAL A:   No acute issues P:   Monitor fluid status Even balance   GASTROINTESTINAL A:   No acute issues P:   PPI On clear liquid diet, advance further  HEMATOLOGIC A:   Hypercoaguable state? Panel sent P:  Likely lifelong anticoagulant patient with unprovoked DVT/PE On heparin gtt, tPA  vein infusion Ensure ordered- ana, [place] [address] gene mutation, cancer risk history, homocysteine level, factor 5 leiden (pending) hemooccult stool  Lupus anticoag noted just above normal, may need repeat as outpt, await ana, esr  INFECTIOUS A:   No acute issues P:   Monitor WBC, fever curve  ENDOCRINE A:   Hyperglycemia  P:   SSI  NEUROLOGIC A:   Pain in leg Bitemporal headache, improved today Gout pain P:   Morphine PRN Head CT 8/3 ruled out any acute process Avoid colchicine in hosp, failing Add pred   FAMILY  - Updates: updated fmaily ,and pt  - Inter-disciplinary family meet or Palliative Care meeting due by:  8/11   Lavon Paganini. Titus Mould, MD, FACP Pgr: [facility]  Pulmonary and [place] Pager: [phone]  01/09/2015, 11:00 AM

## 2015-01-09 NOTE — Progress Notes (Signed)
ANTICOAGULATION CONSULT NOTE - Follow Up Consult  Pharmacy Consult for Heparin  Indication: pulmonary embolus and DVT  No Known Allergies  Patient Measurements: Height: 5\' 10"  (177.8 cm) Weight: 222 lb 3.6 oz (100.8 kg) IBW/kg (Calculated) : 73  Vital Signs: Temp: 98.1 F (36.7 C) (08/05 0843) Temp Source: Oral (08/05 0843) BP: 127/80 mmHg (08/05 1000) Pulse Rate: 71 (08/05 1000)  Labs:  Recent Labs  01/07/15 0333  01/08/15 0228 01/08/15 1158  01/08/15 1742 01/08/15 2310 01/09/15 0500  HGB 14.2  --  13.8  --   < > 13.5 13.4 13.2  HCT 42.2  --  40.9  --   < > 39.4 39.7 39.9  PLT 145*  --  178  --   < > 156 162 167  APTT 73*  --  54* 54*  --   --   --   --   LABPROT 16.8*  --  15.4*  --   --   --   --   --   INR 1.35  --  1.20  --   --   --   --   --   HEPARINUNFRC 0.66  < > 0.27* 0.30  < > 0.33 0.36 0.42  0.38  CREATININE  --   --  1.02  --   --   --   --  1.05  < > = values in this interval not displayed.  Estimated Creatinine Clearance: 95.7 mL/min (by C-G formula based on Cr of 1.05).   Assessment: New onset DVT/PE, alteplase currently infusing with lower heparin goal. HL this AM is therapeutic. No s/s of bleeding noted. Planning to return to INR this afternoon.   Goal of Therapy:  Heparin level 0.2-0.5 units/ml Monitor platelets by anticoagulation protocol: Yes   Plan:  -Continue heparin at 1600 units/hr -Daily CBC/HL -Monitor for bleeding -F/U end of alteplase infusion, likely this afternoon  Albertina Parr, PharmD., BCPS Clinical Pharmacist Pager [phone]

## 2015-01-09 NOTE — Procedures (Signed)
Interventional Radiology Procedure Note  Procedure:   1.) Completion of thrombolysis 2.) PTA femoral vein stenosis 3.) Stenting May-Thurner stenosis  Complications: None  Estimated Blood Loss: 0  Recommendations: - Continue heparin - Bedrest x 4 hrs then ok to ambulate  Signed,  Criselda Peaches, MD

## 2015-01-10 ENCOUNTER — Other Ambulatory Visit: Payer: Self-pay | Admitting: Radiology

## 2015-01-10 DIAGNOSIS — I82402 Acute embolism and thrombosis of unspecified deep veins of left lower extremity: Secondary | ICD-10-CM

## 2015-01-10 LAB — HEPARIN LEVEL (UNFRACTIONATED)
HEPARIN UNFRACTIONATED: 0.37 [IU]/mL (ref 0.30–0.70)
Heparin Unfractionated: 0.41 IU/mL (ref 0.30–0.70)
Heparin Unfractionated: 0.53 IU/mL (ref 0.30–0.70)
Heparin Unfractionated: 0.46 IU/mL (ref 0.30–0.70)

## 2015-01-10 LAB — FIBRINOGEN
FIBRINOGEN: 258 mg/dL (ref 204–475)
FIBRINOGEN: 280 mg/dL (ref 204–475)
FIBRINOGEN: 280 mg/dL (ref 204–475)
Fibrinogen: 274 mg/dL (ref 204–475)

## 2015-01-10 MED ORDER — RIVAROXABAN 20 MG PO TABS
20.0000 mg | ORAL_TABLET | Freq: Two times a day (BID) | ORAL | Status: DC
  Administered 2015-01-10 – 2015-01-11 (×2): 20 mg via ORAL
  Filled 2015-01-10 (×5): qty 1

## 2015-01-10 MED ORDER — RIVAROXABAN 15 MG PO TABS: 15.0000 mg | ORAL_TABLET | Freq: Every day | ORAL | Status: DC

## 2015-01-10 NOTE — Progress Notes (Signed)
PULMONARY / CRITICAL CARE MEDICINE   Name: Nicholas [name] MRN: [id number] DOB: [birth year]    ADMISSION DATE:  01/05/2015 TODAY'S DATE: 01/08/2015  REFERRING MD :  Joya Gaskins  CHIEF COMPLAINT:  DVT/PE  INITIAL PRESENTATION: 54 yo male who presented with sudden onset of leg swelling and SOB, admitted with left leg DVT and B/L PE on 8/1.  He had R leg DVT/PE 2 months post-op after a lumbar surgery October 2015, and was treated with 7 months of Xarelto, which was d/c'ed 1 month prior to this admission. He was admitted to PCCM with bilateralPE and extensive left leg DVT for monitoring. His oxygenation and hemodynamics have remained.   Patient was initially treated with heparin gtt only per patient's preferences, but had minimal improvement and thus intervention with IR to retrieve the clots was recommended.  He denies any recent travel or periods of prolonged immobilization, hemoptysis, hx malignancy or clotting disorders. Has never had clotting disorder workup that he knows of.  STUDIES:  8/1 LE duplex > acute thrombus 8/1 Echo > LVEF 65-70%, PA pressure 34, mild LVH 8/3 Head CT > normal 8/3 CT angio chest > Bilateral pulmonary embolism but much more significant on the right side with a large central right-sided PE. Mild right ventricular heart strain with slight flattening of the left ventricle. The RV LV ratio is 0.92.  SIGNIFICANT EVENTS: 8/1 admitted with DVT/PE, started on heparin 8/3 Had minimal improvement and moved to [facility] 8/4 IR guided left lower extremity and IVC venography with mechanical thromectomy and transcatheter thrombolytic therapy. Started on tPA infusion in vein 8/5 Finished tPA infusion & transferred out of ICU  SUBJECTIVE:  Patient transitioned out of the intensive care unit yesterday. Finished tPA infusion and okay to transition to transition back to Xarelto per interventional radiology note. Has been ambulating in his room as well as the hallway with minimal pain and  no chest discomfort or pressure. Denies any significant dyspnea or cough. Currently maintained on heparin drip.  ROS: Denies any abdominal pain or nausea. No bowel movement yet. No dysuria or hematuria.  VITAL SIGNS: Temp:  [97.5 F (36.4 C)-98.6 F (37 C)] 98.2 F (36.8 C) (08/06 1426) Pulse Rate:  [65-80] 65 (08/06 1426) Resp:  [18-19] 19 (08/06 1426) BP: (127-131)/(82-85) 128/85 mmHg (08/06 1426) SpO2:  [97 %] 97 % (08/06 1426)    INTAKE / OUTPUT:  Intake/Output Summary (Last 24 hours) at 01/10/15 1905 Last data filed at 01/10/15 1300  Gross per 24 hour  Intake    480 ml  Output    700 ml  Net   -220 ml    PHYSICAL EXAMINATION: General:  Laying in bed. Comfortable. No distress. Wife at bedside. Neuro:  Grossly nonfocal. Moving all 4 extremities equally. No meningismus. HEENT: Moist mucous membranes. No oral ulcers. No scleral injection. Cardiovascular:  Regular rate. Bilateral lower extremity edema left greater than right. Lungs:  Good aeration & clear to auscultation bilaterally. Normal work of breathing. Speaking in complete sentences. Abdomen:  Nontender. Hypoactive bowel sounds. Soft. Skin:  Warm and dry. No rash.  LABS:  CBC  Recent Labs Lab 01/08/15 1742 01/08/15 2310 01/09/15 0500  WBC 8.3 9.3 9.0  HGB 13.5 13.4 13.2  HCT 39.4 39.7 39.9  PLT 156 162 167   Coag's  Recent Labs Lab 01/05/15 1509  01/07/15 0333 01/08/15 0228 01/08/15 1158  APTT 29  < > 73* 54* 54*  INR 1.19  --  1.35 1.20  --   < > =  values in this interval not displayed. BMET  Recent Labs Lab 01/06/15 0100 01/08/15 0228 01/09/15 0500  NA 138 137 137  K 3.9 3.9 4.1  CL 104 101 101  CO2 '26 29 28  ' BUN '13 14 14  ' CREATININE 1.19 1.02 1.05  GLUCOSE 97 114* 120*   Electrolytes  Recent Labs Lab 01/06/15 0100 01/08/15 0228 01/09/15 0500  CALCIUM 8.7* 8.7* 8.4*  MG 1.7  --   --   PHOS 2.9  --   --    Sepsis Markers No results for input(s): LATICACIDVEN, PROCALCITON,  O2SATVEN in the last 168 hours. ABG No results for input(s): PHART, PCO2ART, PO2ART in the last 168 hours. Liver Enzymes  Recent Labs Lab 01/05/15 1509  AST 35  ALT 41  ALKPHOS 42  BILITOT 1.1  ALBUMIN 3.4*   Cardiac Enzymes  Recent Labs Lab 01/05/15 2050  TROPONINI <0.03   Glucose  Recent Labs Lab 01/05/15 1621 01/05/15 1959 01/07/15 1237  GLUCAP 77 98 181*    Imaging No results found.   ASSESSMENT / PLAN:  PULMONARY A: Bilateral PE from left leg DVT Hx of prior provoked PE (post-op) Hx of Xarelto use, discontinued 1 month ago  P:   Heparin gtt S/P extremity TPA Transitioning back to Xarelto lifelong  CARDIOVASCULAR A:  Hx HTN  P:  BP mostly normal off medication Monitor tele Continue to hold Losartan  RENAL A:   No acute issues  P:   Monitor fluid status Trend renal fxn w/ daily BUN/Creatinine  GASTROINTESTINAL A:   No acute issues No BM  P:   Tolerating diet Needs BM Protonix PO bid  HEMATOLOGIC A:   Hypercoaguable state? -  Panel sent  P:  Likely lifelong anticoagulant patient with unprovoked DVT/PE On heparin gtt S/P TPA infusion Transition to Xarelto Hypercoag pending  Lupus anticoag noted just above normal, may need repeat as outpt, await ana, esr  INFECTIOUS A:   No acute issues  P:   Monitor for fever Daily CBC to monitor leukocyte count  ENDOCRINE A:   Hyperglycemia   P:   Monitor with daily labs  NEUROLOGIC A:   Pain in leg - improving Bitemporal headache - resolved Gout pain  P:   Norco PRN Head CT 8/3 ruled out any acute process Avoid colchicine in hosp, failing D/C Solumedrol   FAMILY  - Updates: Wife at bedside this evening.  TODAY'S SUMMARY: Status post successful completion of lytic therapy for lower extremity thrombus. Blood pressure has been largely normal therefore no indication to restart losartan. I am discontinuing Solu-Medrol at this time given absence of joint pain other  than mild bilateral knee pain. Transitioning the patient over from heparin drip to Xarelto with pharmacy consultation. Patient can likely discharge to home tomorrow with follow-up with interventional radiology and primary care physician. He will likely need lifelong systemic anticoagulation.  Sonia Baller Ashok Cordia, M.D. [facility] Pager:  [phone] After 3pm or if no response, call [phone] 01/10/2015, 7:05 PM

## 2015-01-10 NOTE — Progress Notes (Addendum)
Correction to the following plan regarding xarelto:  Xarelto 15 mg BID x 21 days, then on 8/28 start 20 mg daily with supper.  Nicholas [name], PharmD, BCPS  Clinical Pharmacist  Pager: [phone]  01/11/2015 1:15PM  ADDENDUM 56 YOM on heparin for PE/DVT s/p lysis and angioplasty. Heparin has been therapeutic. Now to transition to Xarelto for treatment.  SCr 1.05, CrCl with ABW >170mL/min Hgb 13.2, plts 162.  Plan: -start Xarelto 20mg  BIDwc x21 full days of therapy, then on 8/28, start 15mg  qsupper -stop heparin infusion at time first dose of Xarelto is given -CBC q72h while in the hospital  Lauren D. Bajbus, PharmD, BCPS Clinical Pharmacist Pager: [phone] 01/10/2015 7:12 PM     ANTICOAGULATION CONSULT NOTE - Follow Up [place] for Heparin  Indication: pulmonary embolus and DVT  No Known Allergies  Patient Measurements: Height: 5\' 10"  (177.8 cm) Weight: 218 lb 7.6 oz (99.1 kg) IBW/kg (Calculated) : 73  Vital Signs: Temp: 98.6 F (37 C) (08/06 0446) Temp Source: Oral (08/06 0446) BP: 127/82 mmHg (08/06 0446) Pulse Rate: 65 (08/06 0446)  Labs:  Recent Labs  01/08/15 0228 01/08/15 1158  01/08/15 1742 01/08/15 2310 01/09/15 0500  01/09/15 1858 01/09/15 2331 01/10/15 0550  HGB 13.8  --   < > 13.5 13.4 13.2  --   --   --   --   HCT 40.9  --   < > 39.4 39.7 39.9  --   --   --   --   PLT 178  --   < > 156 162 167  --   --   --   --   APTT 54* 54*  --   --   --   --   --   --   --   --   LABPROT 15.4*  --   --   --   --   --   --   --   --   --   INR 1.20  --   --   --   --   --   --   --   --   --   HEPARINUNFRC 0.27* 0.30  < > 0.33 0.36 0.42  0.38  < > 0.40 0.37 0.41  CREATININE 1.02  --   --   --   --  1.05  --   --   --   --   < > = values in this interval not displayed.  Estimated Creatinine Clearance: 94.9 mL/min (by C-G formula based on Cr of 1.05).   Assessment: New onset DVT/PE, s/p lysis and angioplasty, on IV heparin, Heparin level  (0.41) is therapeutic. No s/s of bleeding noted. Likely convert back to po anticoaulant soon, pt previously on Xarelto.  Goal of Therapy:  Heparin level 0.2-0.5 units/ml Monitor platelets by anticoagulation protocol: Yes   Plan:  -Continue heparin at 1600 units/hr -Daily CBC/HL -Monitor for bleeding -F/U plans for oral anticoalgualtion  Nicholas [name], PharmD, BCPS  Clinical Pharmacist  Pager: [phone]

## 2015-01-10 NOTE — Progress Notes (Signed)
Referring Physician(s): Dr. Joya Gaskins, PCCM  Chief Complaint: LLE DVT  Subjective: Pt s/p completion LLE DVT lysis with PTA femoral venous stenosis and stent angioplasty of (L)CIV(May-Thurner stenosis) Feels well this am. Some soreness in calf but overall much better. Hasn't been ambulating much yet.  Allergies: Review of patient's allergies indicates no known allergies.  Medications:  Current facility-administered medications:  .  0.9 %  sodium chloride infusion, 250 mL, Intravenous, PRN, Melina Schools, MD, Last Rate: 10 mL/hr at 01/08/15 0700, 250 mL at 01/08/15 0700 .  0.9 %  sodium chloride infusion, 250 mL, Intravenous, PRN, Aletta Edouard, MD .  acetaminophen (TYLENOL) tablet 650 mg, 650 mg, Oral, Q6H PRN, 650 mg at 01/07/15 1301 **OR** acetaminophen (TYLENOL) suppository 650 mg, 650 mg, Rectal, Q6H PRN, Debbe Odea, MD .  alum & mag hydroxide-simeth (MAALOX/MYLANTA) 200-200-20 MG/5ML suspension 30 mL, 30 mL, Oral, Q6H PRN, Debbe Odea, MD .  heparin ADULT infusion 100 units/mL (25000 units/250 mL), 1,600 Units/hr, Intravenous, Continuous, Lavenia Atlas, RPH, Last Rate: 16 mL/hr at 01/09/15 2109, 1,600 Units/hr at 01/09/15 2109 .  hydrALAZINE (APRESOLINE) injection 10-40 mg, 10-40 mg, Intravenous, Q4H PRN, Rahul P Desai, PA-C .  HYDROcodone-acetaminophen (NORCO/VICODIN) 5-325 MG per tablet 1-2 tablet, 1-2 tablet, Oral, Q4H PRN, Jacqulynn Cadet, MD .  methylPREDNISolone sodium succinate (SOLU-MEDROL) 40 mg/mL injection 40 mg, 40 mg, Intravenous, Q12H, Raylene Miyamoto, MD, 40 mg at 01/10/15 0001 .  midazolam (VERSED) injection 1 mg, 1 mg, Intravenous, Q1H PRN, Aletta Edouard, MD .  morphine 2 MG/ML injection 2-4 mg, 2-4 mg, Intravenous, Q2H PRN, Elsie Stain, MD, 2 mg at 01/06/15 0518 .  morphine 4 MG/ML injection 5 mg, 5 mg, Intravenous, Q1H PRN, Aletta Edouard, MD .  ondansetron [facility]) tablet 4 mg, 4 mg, Oral, Q6H PRN, 4 mg at 01/06/15 1151 **OR** ondansetron  (ZOFRAN) injection 4 mg, 4 mg, Intravenous, Q6H PRN, Debbe Odea, MD, 4 mg at 01/07/15 1306 .  ondansetron (ZOFRAN) injection 4 mg, 4 mg, Intravenous, Q6H PRN, Aletta Edouard, MD .  oxyCODONE (Oxy IR/ROXICODONE) immediate release tablet 5 mg, 5 mg, Oral, Q4H PRN, Melina Schools, MD, 5 mg at 01/09/15 2123 .  pantoprazole (PROTONIX) EC tablet 40 mg, 40 mg, Oral, BID, Raylene Miyamoto, MD, 40 mg at 01/09/15 2110 .  promethazine (PHENERGAN) injection 12.5-25 mg, 12.5-25 mg, Intravenous, Q6H PRN, Chesley Mires, MD, 12.5 mg at 01/09/15 0029 .  sodium chloride 0.9 % injection 3 mL, 3 mL, Intravenous, Q12H, Melina Schools, MD, 3 mL at 01/09/15 1728 .  sodium chloride 0.9 % injection 3 mL, 3 mL, Intravenous, Q12H, Melina Schools, MD, 3 mL at 01/08/15 2146 .  sodium chloride 0.9 % injection 3 mL, 3 mL, Intravenous, PRN, Melina Schools, MD, 3 mL at 01/09/15 2111 .  sodium chloride 0.9 % injection 3 mL, 3 mL, Intravenous, Q12H, Aletta Edouard, MD, 3 mL at 01/08/15 2145 .  sodium chloride 0.9 % injection 3 mL, 3 mL, Intravenous, PRN, Aletta Edouard, MD    Vital Signs: BP 127/82 mmHg  Pulse 65  Temp(Src) 98.6 F (37 C) (Oral)  Resp 18  Ht 5\' 10"  (1.778 m)  Wt 218 lb 7.6 oz (99.1 kg)  BMI 31.35 kg/m2  SpO2 97%  Physical Exam LLE with residual edema in lower calf and ankle, otherwise leg soft, thigh without edema. Venipuncture site clean, no hematoma   Labs:  CBC:  Recent Labs  01/08/15 1229 01/08/15 1742 01/08/15 2310 01/09/15 0500  WBC 11.6*  8.3 9.3 9.0  HGB 14.3 13.5 13.4 13.2  HCT 43.0 39.4 39.7 39.9  PLT 188 156 162 167    COAGS:  Recent Labs  05/07/14 0203 01/05/15 1509  01/06/15 1811 01/07/15 0333 01/08/15 0228 01/08/15 1158  INR 1.10 1.19  --   --  1.35 1.20  --   APTT 123* 29  < > 60* 73* 54* 54*  < > = values in this interval not displayed.  BMP:  Recent Labs  01/05/15 1509 01/06/15 0100 01/08/15 0228 01/09/15 0500  NA 140 138 137 137  K 4.3 3.9 3.9 4.1  CL  104 104 101 101  CO2 26 26 29 28   GLUCOSE 97 97 114* 120*  BUN 13 13 14 14   CALCIUM 9.1 8.7* 8.7* 8.4*  CREATININE 1.14 1.19 1.02 1.05  GFRNONAA >60 >60 >60 >60  GFRAA >60 >60 >60 >60    LIVER FUNCTION TESTS:  Recent Labs  03/24/14 1424 05/06/14 1823 05/07/14 0203 01/05/15 1509  BILITOT 1.0 0.7 0.6 1.1  AST 83* 70* 57* 35  ALT 122* 90* 84* 41  ALKPHOS 58 52 61 42  PROT 7.1 6.5 6.7 6.5  ALBUMIN 3.8 3.7 3.8 3.4*    Assessment and Plan: S/p successful LLE DVT thrombolysis and stent angioplasty of (L)CIV and femoral vein stenoses. OK to be OOB ambulate. Will order LE compression stockings and discussed use with pt. Can convert back to po anticoaulant of choice, pt previously on Xarelto. We will follow him in our clinic after discharge in the next 3-4 weeks.  SignedAscencion Dike 01/10/2015, 9:36 AM   I spent a total of 15 Minutes in face to face in clinical consultation/evaluation, greater than 50% of which was counseling/coordinating care for LLE DVT, s/p lysis and angioplasty

## 2015-01-11 ENCOUNTER — Telehealth: Payer: Self-pay | Admitting: Adult Health

## 2015-01-11 LAB — HOMOCYSTEINE: HOMOCYSTEINE-NORM: 10.6 umol/L (ref 0.0–15.0)

## 2015-01-11 LAB — FIBRINOGEN
FIBRINOGEN: 203 mg/dL — AB (ref 204–475)
Fibrinogen: 246 mg/dL (ref 204–475)

## 2015-01-11 LAB — BASIC METABOLIC PANEL
Anion gap: 6 (ref 5–15)
BUN: 16 mg/dL (ref 6–20)
CHLORIDE: 104 mmol/L (ref 101–111)
CO2: 31 mmol/L (ref 22–32)
CREATININE: 0.96 mg/dL (ref 0.61–1.24)
Calcium: 8.9 mg/dL (ref 8.9–10.3)
GLUCOSE: 109 mg/dL — AB (ref 65–99)
Potassium: 4.2 mmol/L (ref 3.5–5.1)
SODIUM: 141 mmol/L (ref 135–145)

## 2015-01-11 MED ORDER — RIVAROXABAN 15 MG PO TABS: ORAL_TABLET | ORAL | Status: DC

## 2015-01-11 MED ORDER — RIVAROXABAN (XARELTO) VTE STARTER PACK (15 & 20 MG)
ORAL_TABLET | ORAL | Status: DC
Start: 2015-01-11 — End: 2015-01-11

## 2015-01-11 MED ORDER — RIVAROXABAN (XARELTO) VTE STARTER PACK (15 & 20 MG): ORAL_TABLET | ORAL | Status: DC

## 2015-01-11 MED ORDER — COLCHICINE 0.6 MG PO TABS: 0.6000 mg | ORAL_TABLET | Freq: Every day | ORAL | Status: DC | PRN

## 2015-01-11 MED ORDER — RIVAROXABAN 20 MG PO TABS: ORAL_TABLET | ORAL | Status: DC

## 2015-01-11 MED ORDER — RIVAROXABAN 20 MG PO TABS: 20.0000 mg | ORAL_TABLET | Freq: Every day | ORAL | Status: DC

## 2015-01-11 MED ORDER — RIVAROXABAN 15 MG PO TABS
15.0000 mg | ORAL_TABLET | Freq: Two times a day (BID) | ORAL | Status: DC
  Filled 2015-01-11 (×2): qty 1

## 2015-01-11 NOTE — Progress Notes (Signed)
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 Pt states became aware of this with last hospital  admit and was placed on Xarelto. [facility] @ [phone] called per CM to confirm medication  in stock. Pharmacy confirmed they do have a  partial amt that can be given to pt on today and the remain can be picked up on tomorrow afternoon after inventory has been restocked. CM made pt/wife aware. No other needs identified per CM.

## 2015-01-11 NOTE — Discharge Instructions (Signed)
Information on my medicine - XARELTO (rivaroxaban)  This medication education was reviewed with me or my healthcare representative as part of my discharge preparation.    WHY WAS XARELTO PRESCRIBED FOR YOU? Xarelto was prescribed to treat blood clots that may have been found in the veins of your legs (deep vein thrombosis) or in your lungs (pulmonary embolism) and to reduce the risk of them occurring again.  What do you need to know about Xarelto? The starting dose is one 15 mg tablet taken TWICE daily with food for the FIRST 21 DAYS then on 02/01/2015  the dose is changed to one 20 mg tablet taken ONCE A DAY with your evening meal.  DO NOT stop taking Xarelto without talking to the health care provider who prescribed the medication.  Refill your prescription for 20 mg tablets before you run out.  After discharge, you should have regular check-up appointments with your healthcare provider that is prescribing your Xarelto.  In the future your dose may need to be changed if your kidney function changes by a significant amount.  What do you do if you miss a dose? If you are taking Xarelto TWICE DAILY and you miss a dose, take it as soon as you remember. You may take two 15 mg tablets (total 30 mg) at the same time then resume your regularly scheduled 15 mg twice daily the next day.  If you are taking Xarelto ONCE DAILY and you miss a dose, take it as soon as you remember on the same day then continue your regularly scheduled once daily regimen the next day. Do not take two doses of Xarelto at the same time.   Important Safety Information Xarelto is a blood thinner medicine that can cause bleeding. You should call your healthcare provider right away if you experience any of the following: ? Bleeding from an injury or your nose that does not stop. ? Unusual colored urine (red or dark brown) or unusual colored stools (red or black). ? Unusual bruising for unknown reasons. ? A serious fall  or if you hit your head (even if there is no bleeding).  Some medicines may interact with Xarelto and might increase your risk of bleeding while on Xarelto. To help avoid this, consult your healthcare provider or pharmacist prior to using any new prescription or non-prescription medications, including herbals, vitamins, non-steroidal anti-inflammatory drugs (NSAIDs) and supplements.  This website has more information on Xarelto: [email or web address]-[email or web address]

## 2015-01-11 NOTE — Telephone Encounter (Signed)
Needs OV for hospital f/u in 1 week with TP or Dr. Ashok Cordia for submassive PE.

## 2015-01-11 NOTE — Discharge Summary (Signed)
Physician Discharge Summary  Patient ID: KEE DRUDGE MRN: [id number] DOB/AGE: 06-Jan-1961 54 y.o.  Admit date: 01/05/2015 Discharge date: 01/11/2015    Discharge Diagnoses:  Principal Problem:   PE (pulmonary embolism) Active Problems:   Essential hypertension   GERD (gastroesophageal reflux disease)   Gout   Pulmonary embolism   DVT (deep venous thrombosis)    Brief Summary: Nicholas [name] is a 54 yo male with presumed provoked clot after lumbar spine surgery in Fall 2015. He was tx with 7 months of xarelto, which was d/c'ed about 1 month prior to admission. He presented 8/1 with Lt leg pain and swelling, and then dyspnea on exertion. He was found to have PE and extensive Lt leg DVT. He was started on heparin gtt.  Given large clot burden in LLE he underwent IR guided mechanical thrombectomy and transcatheter thrombolytic therapy to LLE from 8/4-8/5.  He tolerated this well, was tx out of ICU and started on rivaroxaban.  He is ambulating without difficulty, on RA with stable vitals, no dyspnea or chest pain and much improved leg pain.  He is ready for d/c home pending insurance approval of xarelto. He will f/u as outpt with pulmonary and with [facility].    STUDIES:  8/1 LE duplex > acute thrombus 8/1 Echo > LVEF 65-70%, PA pressure 34, mild LVH 8/1 Hypercoagulable panel > Cardiolipin Ab negative, Homocysteine 18.7, Beta 2 glycoprotein negative, lupus anti-coagulant 55.2, protein C/protein S/anti-thrombin III negative 8/3 Head CT > normal 8/3 CT angio chest > Bilateral pulmonary embolism but much more significant on the right side with a large central right-sided PE. Mild right ventricular heart strain with slight flattening of the left ventricle. The RV LV ratio is 0.92. 8/5 ESR >> 5  SIGNIFICANT EVENTS: 8/1 admitted with DVT/PE, started on heparin 8/3 Had minimal improvement and moved to [facility] 8/4 IR guided left lower extremity and IVC venography with mechanical thromectomy  and transcatheter thrombolytic therapy. Started on tPA infusion in vein 8/5 Finished tPA infusion & transferred out of ICU 8/6 Started rivaroxaban                                                                     D/c summary by Discharge Diagnosis  B/l PE from Lt leg DVT with prior hx of PE/DVT. Discussion: He had borderline elevation of lupus anticoagulant >> not sure if this is significant. Plan: - continue xarelto >> likely will need indefinite therapy - started xarelto 8/06 >> continue 15 mg bid until 01/31/15 >> then change to 20 mg daily - will need f/u with IR beginning of September 2016; continue compression stocking LT leg - f/u Prothrombin gene mutation, Factor 5 leiden from 8/01 - f/u ANA, Factor 5 leiden, homocysteine, prothrombin gene mutation from 8/05 - will need to repeat lupus anticoagulant as outpt >> pt's wife had requested hematology evaluation >> can be arranged as outpt  Hx of HTN. Plan: - resume losartan  Constipation. Hx of GERD. Plan: - colace, miralax - prn omeprazole as outpt  Hx of Gout. Plan: - f/u with PCP as outpt - prn colchicine  Chronic back pain. Plan: - f/u with PCP, and ortho (Dr. Melina Schools)  Headache >> resolved. Plan: - prn analgesics  as outpt  Hyperglycemia >> improved. Plan: - f/u with PCP as outpt   Filed Vitals:   01/10/15 0446 01/10/15 1426 01/10/15 2108 01/11/15 0523  BP: 127/82 128/85 146/83 140/84  Pulse: 65 65 67 65  Temp: 98.6 F (37 C) 98.2 F (36.8 C) 98.3 F (36.8 C) 97.5 F (36.4 C)  TempSrc: Oral Oral Oral Oral  Resp: _0 Height:      Weight:      SpO2: 97% 97% 99% 100%     Discharge Labs  BMET  Recent Labs Lab 01/05/15 1509 01/06/15 0100 01/08/15 0228 01/09/15 0500 01/11/15 0548  NA 140 138 137 137 141  K 4.3 3.9 3.9 4.1 4.2  CL 104 104 101 101 104  CO2 _1 GLUCOSE 97 97 114* 120* 109*  BUN _2 CREATININE 1.14 1.19 1.02 1.05 0.96  CALCIUM 9.1  8.7* 8.7* 8.4* 8.9  MG  --  1.7  --   --   --   PHOS  --  2.9  --   --   --      CBC   Recent Labs Lab 01/08/15 1742 01/08/15 2310 01/09/15 0500  HGB 13.5 13.4 13.2  HCT 39.4 39.7 39.9  WBC 8.3 9.3 9.0  PLT 156 162 167   Anti-Coagulation  Recent Labs Lab 01/05/15 1509 01/07/15 0333 01/08/15 0228  INR 1.19 1.35 1.20      Discharge Instructions    Call MD for:  difficulty breathing, headache or visual disturbances    Complete by:  As directed      Call MD for:  extreme fatigue    Complete by:  As directed      Call MD for:  persistant dizziness or light-headedness    Complete by:  As directed      Call MD for:  redness, tenderness, or signs of infection (pain, swelling, redness, odor or green/yellow discharge around incision site)    Complete by:  As directed      Call MD for:  severe uncontrolled pain    Complete by:  As directed      Diet - low sodium heart healthy    Complete by:  As directed      Increase activity slowly    Complete by:  As directed                 Follow-up Information    Follow up with [facility], HEATH, MD. Go in 4 weeks.   Specialties:  Interventional Radiology, Radiology   Why:  Post procedure eval. Office will contact you with appointment date/time   Contact information:   [address] [place] [address] [phone]       Follow up with Tera Partridge, MD.   Specialty:  Pulmonary Disease   Why:  [place] [facility] - office will call you    Contact information:   [address] [place] [address] [phone]       Follow up with Dahlia Bailiff, MD. Schedule an appointment as soon as possible for a visit in 2 weeks.   Specialty:  Orthopedic Surgery   Contact information:   [address] [place] [address] [phone]       Follow up with [facility]. Schedule an appointment as soon as possible for a visit in 2 weeks.   Contact information:   [place]  Medication List    STOP taking these medications        naproxen 375 MG tablet  Commonly known as:  NAPROSYN      TAKE these medications        colchicine 0.6 MG tablet  Take 1 tablet (0.6 mg total) by mouth daily as needed.     losartan 25 MG tablet  Commonly known as:  COZAAR  Take 25 mg by mouth daily.     omeprazole 20 MG capsule  Commonly known as:  PRILOSEC  Take 20 mg by mouth daily.     oxycodone 5 MG capsule  Commonly known as:  OXY-IR  Take 5 mg by mouth every 6 (six) hours as needed for pain.     predniSONE 5 MG tablet  Commonly known as:  DELTASONE  Take 5 mg by mouth daily as needed. For gout flare up     probenecid 500 MG tablet  Commonly known as:  BENEMID  Take 500 mg by mouth daily.     Rivaroxaban 15 MG Tabs tablet  Commonly known as:  XARELTO  73m po BID through 8/27. On 8/28 start 277mdaily.     rivaroxaban 20 MG Tabs tablet  Commonly known as:  XARELTO  2054mo daily starting 8/28          Disposition: 01-Home or Self Care  Discharged Condition: RubQUANTA ROHERs met maximum benefit of inpatient care and is medically stable and cleared for discharge.  Patient is pending follow up as above.      Time spent on disposition:  Greater than 35 minutes.   Signed: WDarlina SicilianP 01/11/2015  12:33 PM Pager: [phone] or [phone] during the described time interval was provided by me and/or other providers on the critical care team. I have reviewed this patient's available data, including medical history, events of note, physical examination and test results as part of my evaluation.  VinChesley MiresD [facility] 01/11/2015, 8:05 PM Pager:  [phone] 3pm call: [phone]

## 2015-01-11 NOTE — Progress Notes (Signed)
PULMONARY / CRITICAL CARE MEDICINE   Name: Nicholas [name] MRN: [id number] DOB: 06-10-1960    ADMISSION DATE:  01/05/2015 TODAY'S DATE: 01/08/2015  REFERRING MD :  Joya Gaskins  CHIEF COMPLAINT:  DVT/PE  INITIAL PRESENTATION:  54 yo male with presumed provoked clot after lumbar spine surgery in Fall 2015. He was tx with 7 months of xarelto, which was d/c'ed about 1 month prior to admission. He developed Lt leg pain and swelling, and then dyspnea on exertion. He was found to have PE and extensive Lt leg DVT.  STUDIES:  8/1 LE duplex > acute thrombus 8/1 Echo > LVEF 65-70%, PA pressure 34, mild LVH 8/1 Hypercoagulable panel > Cardiolipin Ab negative, Homocysteine 18.7, Beta 2 glycoprotein negative, lupus anti-coagulant 55.2, protein C/protein S/anti-thrombin III negative 8/3 Head CT > normal 8/3 CT angio chest > Bilateral pulmonary embolism but much more significant on the right side with a large central right-sided PE. Mild right ventricular heart strain with slight flattening of the left ventricle. The RV LV ratio is 0.92. 8/5 ESR >> 5  SIGNIFICANT EVENTS: 8/1 admitted with DVT/PE, started on heparin 8/3 Had minimal improvement and moved to [facility] 8/4 IR guided left lower extremity and IVC venography with mechanical thromectomy and transcatheter thrombolytic therapy. Started on tPA infusion in vein 8/5 Finished tPA infusion & transferred out of ICU 8/6 Started rivaroxaban  SUBJECTIVE:  Denies chest pain.  Leg pain much better.  Walked in hall yesterday w/o difficulty.  Had small BM last night.  Denies abdominal pain.  VITAL SIGNS: Temp:  [97.5 F (36.4 C)-98.3 F (36.8 C)] 97.5 F (36.4 C) (08/07 0523) Pulse Rate:  [65-67] 65 (08/07 0523) Resp:  [18-19] 18 (08/07 0523) BP: (128-146)/(83-85) 140/84 mmHg (08/07 0523) SpO2:  [97 %-100 %] 100 % (08/07 0523)    PHYSICAL EXAMINATION: General: pleasant Neuro: normal strength HEENT: no sinus tenderness Cardiovascular: regular, no  murmur Lungs: no wheeze/rales Abdomen: soft, non tender, + bowel sounds Skin: decreased swelling Lt leg  LABS:  CBC  Recent Labs Lab 01/08/15 1742 01/08/15 2310 01/09/15 0500  WBC 8.3 9.3 9.0  HGB 13.5 13.4 13.2  HCT 39.4 39.7 39.9  PLT 156 162 167   Coag's  Recent Labs Lab 01/05/15 1509  01/07/15 0333 01/08/15 0228 01/08/15 1158  APTT 29  < > 73* 54* 54*  INR 1.19  --  1.35 1.20  --   < > = values in this interval not displayed.   BMET  Recent Labs Lab 01/08/15 0228 01/09/15 0500 01/11/15 0548  NA 137 137 141  K 3.9 4.1 4.2  CL 101 101 104  CO2 '29 28 31  ' BUN '14 14 16  ' CREATININE 1.02 1.05 0.96  GLUCOSE 114* 120* 109*   Electrolytes  Recent Labs Lab 01/06/15 0100 01/08/15 0228 01/09/15 0500 01/11/15 0548  CALCIUM 8.7* 8.7* 8.4* 8.9  MG 1.7  --   --   --   PHOS 2.9  --   --   --    Liver Enzymes  Recent Labs Lab 01/05/15 1509  AST 35  ALT 41  ALKPHOS 42  BILITOT 1.1  ALBUMIN 3.4*   Cardiac Enzymes  Recent Labs Lab 01/05/15 2050  TROPONINI <0.03   Glucose  Recent Labs Lab 01/05/15 1621 01/05/15 1959 01/07/15 1237  GLUCAP 77 98 181*    ASSESSMENT / PLAN:  B/l PE from Lt leg DVT with prior hx of PE/DVT. Discussion: He had borderline elevation of lupus anticoagulant >> not  sure if this is significant. Plan: - continue xarelto >> likely will need indefinite therapy - started xarelto 8/06 >> continue 15 mg bid until 01/31/15 >> then change to 20 mg daily - will need f/u with IR beginning of September 2016; continue compression stocking LT leg - f/u Prothrombin gene mutation, Factor 5 leiden from 8/01 - f/u ANA, Factor 5 leiden, homocysteine, prothrombin gene mutation from 8/05 - will need to repeat lupus anticoagulant as outpt >> pt's wife had requested hematology evaluation >> can be arranged as outpt  Hx of HTN. Plan: - resume losartan  Constipation. Hx of GERD. Plan: - colace, miralax - prn omeprazole as outpt  Hx  of Gout. Plan: - f/u with PCP as outpt - prn colchicine  Chronic back pain. Plan: - f/u with PCP, and ortho (Dr. Melina Schools)  Headache >> resolved. Plan: - prn analgesics as outpt  Hyperglycemia >> improved. Plan: - f/u with PCP as outpt  Summary: He is ready for d/c home.  Need to ensure he can get xarelto prescription filled today.  He is followed at [facility] >> will need f/u there.  He will need f/u in pulmonary office within one week >> can be with Tammy Parrett or Dr. Ashok Cordia.  Chesley Mires, MD [place] 01/11/2015, 9:12 AM Pager:  [phone] After 3pm call: [phone]

## 2015-01-12 ENCOUNTER — Telehealth: Payer: Self-pay | Admitting: Hematology

## 2015-01-12 LAB — FACTOR 5 LEIDEN

## 2015-01-12 LAB — ANTINUCLEAR ANTIBODIES, IFA: ANA Ab, IFA: NEGATIVE

## 2015-01-12 NOTE — Telephone Encounter (Signed)
NEW PATIENT APPT-S/W PATIENT AND GAVE NP APPT FOR 08/18 @ 2:30 W/DR. FENG REFERRING DR.DAHARI BROOKS DX- DVT   REFERRAL INFORMATION SCANNED

## 2015-01-12 NOTE — Telephone Encounter (Signed)
Called spoke with pt. appt scheduled for 8/16 at 4.

## 2015-01-13 ENCOUNTER — Other Ambulatory Visit: Payer: Self-pay | Admitting: Radiology

## 2015-01-13 ENCOUNTER — Other Ambulatory Visit (HOSPITAL_COMMUNITY): Payer: Self-pay | Admitting: Interventional Radiology

## 2015-01-13 DIAGNOSIS — I82402 Acute embolism and thrombosis of unspecified deep veins of left lower extremity: Secondary | ICD-10-CM

## 2015-01-13 LAB — PROTHROMBIN GENE MUTATION

## 2015-01-15 LAB — PROTHROMBIN GENE MUTATION

## 2015-01-15 LAB — FACTOR 5 LEIDEN

## 2015-01-20 ENCOUNTER — Inpatient Hospital Stay: Payer: BLUE CROSS/BLUE SHIELD | Admitting: Pulmonary Disease

## 2015-01-21 ENCOUNTER — Telehealth: Payer: Self-pay | Admitting: *Deleted

## 2015-01-21 NOTE — Telephone Encounter (Signed)
I called pt to schedule f/u DVT [facility] from [facility]. Per pt wife they have an appt with Dr Milinda Hirschfeld and doesn't feel our appt would be necessary.She does not want additional medical charges. Will notify Dr Laurence Ferrari.

## 2015-01-22 ENCOUNTER — Encounter: Payer: Self-pay | Admitting: Hematology

## 2015-01-22 ENCOUNTER — Ambulatory Visit: Payer: BLUE CROSS/BLUE SHIELD

## 2015-01-22 ENCOUNTER — Ambulatory Visit (HOSPITAL_BASED_OUTPATIENT_CLINIC_OR_DEPARTMENT_OTHER): Payer: BLUE CROSS/BLUE SHIELD | Admitting: Hematology

## 2015-01-22 ENCOUNTER — Telehealth: Payer: Self-pay | Admitting: Hematology and Oncology

## 2015-01-22 VITALS — BP 135/87 | HR 71 | Temp 97.9°F | Resp 18 | Ht 70.0 in | Wt 213.3 lb

## 2015-01-22 DIAGNOSIS — I2699 Other pulmonary embolism without acute cor pulmonale: Secondary | ICD-10-CM | POA: Diagnosis not present

## 2015-01-22 DIAGNOSIS — I82402 Acute embolism and thrombosis of unspecified deep veins of left lower extremity: Secondary | ICD-10-CM | POA: Diagnosis not present

## 2015-01-22 MED ORDER — RIVAROXABAN 20 MG PO TABS: ORAL_TABLET | ORAL | Status: DC

## 2015-01-22 NOTE — Telephone Encounter (Signed)
Appointments made and avs printed for patient,[place] primary and gi will call the patient

## 2015-01-22 NOTE — Progress Notes (Signed)
Checked in new pt with no financial concerns prior to seeing the dr.  Abbott [name] has Nicholas [name]'s card for any billing questions, concerns or if financial assistance is needed.

## 2015-01-22 NOTE — Progress Notes (Signed)
[place]  [phone] [phone]  Clinic New Consult Note   Patient Care Team: No Pcp Per Patient as PCP - General (General Practice) 01/22/2015  CHIEF COMPLAINTS/PURPOSE OF CONSULTATION:  Recurrent PE/DVT   HISTORY OF PRESENTING ILLNESS:  Nicholas [name] 54 y.o. male is here because of recurrent PE and DVT.   He had lumbar lab in ectomy and decompression surgery in October 2015, he was able to move around without too much difficulty after surgery, but was off work and not very physically active. During his 1 months postop follow-up, he was found to be hypoxic in the office, and CT scan reviewed multiple bilateral pulmonary emboli. Ultrasound also showed right deep vein thrombosis involving the right popliteal, posterior tibia, and perineal vein. He was admitted to hospital for a few days and discharged home on Xarelto. He took it for 6 months and stopped it.  He developed worsening left leg swollen, pain, and dyspnea, 1 months after he stopped Xarelto. He was admitted to the hospital on 01/05/2015. He was found to have PE and extensive Lt leg DVT. He was started on heparin gtt. Given large clot burden in LLE he underwent IR guided mechanical thrombectomy and transcatheter thrombolytic therapy to LLE from 8/4-8/5. He tolerated this well, was tx out of ICU and started on rivaroxaban. He is ambulating without difficulty, on RA with stable vitals, no dyspnea or chest pain and much improved leg pain, and was discharged home on 01/11/2015.   He has felt much better since her hospital discharge. His left leg swollen anemia resolved, his dyspnea on exertion is much improved, he is back to walk normally. He denies any significant pain, no other symptoms.  He denies recent history of trauma, long distance travel, dehydration, recent surgery, smoking or prolonged immobilization.  He had no prior history or diagnosis of cancer. He has no family history of thrombosis.  His mother and a paternal grandmother had colon cancer. He had screening colonoscopy 6-7 years ago at the [place], was found to have couple of polyps. He is overdue 2 years for colonoscopy now. He never had PSA screening test before. He is to see his doctor at the [place], but does not want to go back. He does not have a primary care physician.  MEDICAL HISTORY:  Past Medical History  Diagnosis Date  . Hypertension   . GERD (gastroesophageal reflux disease)   . Arthritis     "my whole body"  . Chronic back pain     "mainly lower; some in top too" (03/26/2014)  . History of gout     SURGICAL HISTORY: Past Surgical History  Procedure Laterality Date  . Carpal tunnel release Bilateral 2012  . Lumbar laminectomy/decompression microdiscectomy  03/26/2014    L3-S1  . Inguinal hernia repair Right 2000's  . Back surgery    . Decompressive lumbar laminectomy level 3 N/A 03/26/2014    Procedure: L3-S1 DECOMPRESSION;  Surgeon: Melina Schools, MD;  Location: [facility];  Service: Orthopedics;  Laterality: N/A;    SOCIAL HISTORY: Social History   Social History  . Marital Status: Married    Spouse Name: N/A  . Number of Children: N/A  . Years of Education: N/A   Occupational History  . Not on file.   Social History Main Topics  . Smoking status: Former Smoker -- 1.00 packs/day for 15 years    Types: Cigarettes    Quit date: 06/06/1988  . Smokeless tobacco: Former Systems developer  Types: Snuff, Chew     Comment: "quit smoking cigarettes & smokeless tobacco ~ 2000"  . Alcohol Use: 10.8 oz/week    18 Cans of beer per week     Comment: 03/26/2014 "2-3, 12oz beers/day"  . Drug Use: No  . Sexual Activity: Yes   Other Topics Concern  . Not on file   Social History Narrative    FAMILY HISTORY: Family History  Problem Relation Age of Onset  . Cancer Mother 3    colon cancer   . Cancer Paternal Grandmother     colon cancer    ALLERGIES:  has No Known Allergies.  MEDICATIONS:  Current Outpatient  Prescriptions  Medication Sig Dispense Refill  . colchicine 0.6 MG tablet Take 1 tablet (0.6 mg total) by mouth daily as needed.    Marland Kitchen omeprazole (PRILOSEC) 20 MG capsule Take 20 mg by mouth daily.    Marland Kitchen oxycodone (OXY-IR) 5 MG capsule Take 5 mg by mouth every 6 (six) hours as needed for pain.    . predniSONE (DELTASONE) 5 MG tablet Take 5 mg by mouth daily as needed. For gout flare up    . probenecid (BENEMID) 500 MG tablet Take 500 mg by mouth daily.     . Rivaroxaban (XARELTO) 15 MG TABS tablet 27m po BID through 8/27. On 8/28 start 275mdaily. 41 tablet 0  . losartan (COZAAR) 25 MG tablet Take 25 mg by mouth daily.    . rivaroxaban (XARELTO) 20 MG TABS tablet 2024mo daily starting 8/28 30 tablet 5  . zolpidem (AMBIEN) 10 MG tablet Take 10 mg by mouth at bedtime as needed.     No current facility-administered medications for this visit.    REVIEW OF SYSTEMS:   Constitutional: Denies fevers, chills or abnormal night sweats Eyes: Denies blurriness of vision, double vision or watery eyes Ears, nose, mouth, throat, and face: Denies mucositis or sore throat Respiratory: Denies cough, dyspnea or wheezes Cardiovascular: Denies palpitation, chest discomfort or lower extremity swelling Gastrointestinal:  Denies nausea, heartburn or change in bowel habits Skin: Denies abnormal skin rashes Lymphatics: Denies new lymphadenopathy or easy bruising Neurological:Denies numbness, tingling or new weaknesses Behavioral/Psych: Mood is stable, no new changes  All other systems were reviewed with the patient and are negative.  PHYSICAL EXAMINATION: ECOG PERFORMANCE STATUS: 0 - Asymptomatic  Filed Vitals:   01/22/15 1451  BP: 135/87  Pulse: 71  Temp: 97.9 F (36.6 C)  Resp: 18   Filed Weights   01/22/15 1451  Weight: 213 lb 4.8 oz (96.752 kg)    GENERAL:alert, no distress and comfortable SKIN: skin color, texture, turgor are normal, no rashes or significant lesions EYES: normal, conjunctiva  are pink and non-injected, sclera clear OROPHARYNX:no exudate, no erythema and lips, buccal mucosa, and tongue normal  NECK: supple, thyroid normal size, non-tender, without nodularity LYMPH:  no palpable lymphadenopathy in the cervical, axillary or inguinal LUNGS: clear to auscultation and percussion with normal breathing effort HEART: regular rate & rhythm and no murmurs and no lower extremity edema ABDOMEN:abdomen soft, non-tender and normal bowel sounds Musculoskeletal:no cyanosis of digits and no clubbing  PSYCH: alert & oriented x 3 with fluent speech NEURO: no focal motor/sensory deficits  LABORATORY DATA:  I have reviewed the data as listed Recent Results (from the past 2160 hour(s))  MRSA PCR Screening     Status: None   Collection Time: 01/05/15  2:08 PM  Result Value Ref Range   MRSA by PCR NEGATIVE NEGATIVE  Comment:        The GeneXpert MRSA Assay (FDA approved for NASAL specimens only), is one component of a comprehensive MRSA colonization surveillance program. It is not intended to diagnose MRSA infection nor to guide or monitor treatment for MRSA infections.   Comprehensive metabolic panel     Status: Abnormal   Collection Time: 01/05/15  3:09 PM  Result Value Ref Range   Sodium 140 135 - 145 mmol/L   Potassium 4.3 3.5 - 5.1 mmol/L   Chloride 104 101 - 111 mmol/L   CO2 26 22 - 32 mmol/L   Glucose, Bld 97 65 - 99 mg/dL   BUN 13 6 - 20 mg/dL   Creatinine, Ser 1.14 0.61 - 1.24 mg/dL   Calcium 9.1 8.9 - 10.3 mg/dL   Total Protein 6.5 6.5 - 8.1 g/dL   Albumin 3.4 (L) 3.5 - 5.0 g/dL   AST 35 15 - 41 U/L   ALT 41 17 - 63 U/L   Alkaline Phosphatase 42 38 - 126 U/L   Total Bilirubin 1.1 0.3 - 1.2 mg/dL   GFR calc non Af Amer >60 >60 mL/min   GFR calc Af Amer >60 >60 mL/min    Comment: (NOTE) The eGFR has been calculated using the CKD EPI equation. This calculation has not been validated in all clinical situations. eGFR's persistently <60 mL/min signify  possible Chronic Kidney Disease.    Anion gap 10 5 - 15  CBC WITH DIFFERENTIAL     Status: Abnormal   Collection Time: 01/05/15  3:09 PM  Result Value Ref Range   WBC 6.7 4.0 - 10.5 K/uL   RBC 4.78 4.22 - 5.81 MIL/uL   Hemoglobin 14.6 13.0 - 17.0 g/dL   HCT 44.2 39.0 - 52.0 %   MCV 92.5 78.0 - 100.0 fL   MCH 30.5 26.0 - 34.0 pg   MCHC 33.0 30.0 - 36.0 g/dL   RDW 16.0 (H) 11.5 - 15.5 %   Platelets 122 (L) 150 - 400 K/uL   Neutrophils Relative % 60 43 - 77 %   Neutro Abs 4.0 1.7 - 7.7 K/uL   Lymphocytes Relative 22 12 - 46 %   Lymphs Abs 1.5 0.7 - 4.0 K/uL   Monocytes Relative 12 3 - 12 %   Monocytes Absolute 0.8 0.1 - 1.0 K/uL   Eosinophils Relative 6 (H) 0 - 5 %   Eosinophils Absolute 0.4 0.0 - 0.7 K/uL   Basophils Relative 0 0 - 1 %   Basophils Absolute 0.0 0.0 - 0.1 K/uL  APTT     Status: None   Collection Time: 01/05/15  3:09 PM  Result Value Ref Range   aPTT 29 24 - 37 seconds  Protime-INR     Status: Abnormal   Collection Time: 01/05/15  3:09 PM  Result Value Ref Range   Prothrombin Time 15.3 (H) 11.6 - 15.2 seconds   INR 1.19 0.00 - 1.49  Glucose, capillary     Status: None   Collection Time: 01/05/15  4:21 PM  Result Value Ref Range   Glucose-Capillary 77 65 - 99 mg/dL   Comment 1 Notify RN    Comment 2 Document in Chart   Glucose, capillary     Status: None   Collection Time: 01/05/15  7:59 PM  Result Value Ref Range   Glucose-Capillary 98 65 - 99 mg/dL   Comment 1 Notify RN    Comment 2 Document in Chart   Antithrombin III  Status: None   Collection Time: 01/05/15  8:50 PM  Result Value Ref Range   AntiThromb III Func 85 75 - 120 %    Comment: Performed at [place] C activity     Status: None   Collection Time: 01/05/15  8:50 PM  Result Value Ref Range   Protein C Activity 122 74 - 151 %    Comment: (NOTE) Performed At: [facility] [address] [place], [place] [id number] Lindon Romp MD AX:[id number]     Protein C, total     Status: Abnormal   Collection Time: 01/05/15  8:50 PM  Result Value Ref Range   Protein C, Total 65 (L) 70 - 140 %    Comment: (NOTE) A deficiency of protein C (PC), either congenital or acquired, increases the risk of thromboembolism. Congenital deficiencies of PC are very rare; acquired PC deficiency is much more common. PC levels can be transiently diminished after an acute thrombotic event. Oral anticoagulant therapy with warfarin will lower PC levels as well as vitamin K deficiency. Acquired deficiency can also occur in individuals with disseminated intravascular coagulation (DIC), sepsis, severe liver disease, nephrotic syndrome, and in inflammatory bowel disease. Levels may be spuriously decreased in individuals with Factor V Leiden. Drug therapy with L-asparaginse, fluorouracil, methotrexate, cyclophosphamide or tamoxifen can also reduce PC levels. It has been suggested that repeat blood sampling and testing after ruling out acquired causes of deficiency should be performed before the patient is diagnosed with congenital Protein C deficiency. Performed At: [facility] [address], [place] [id number] Lindon Romp MD OP:[id number]   Protein S activity     Status: None   Collection Time: 01/05/15  8:50 PM  Result Value Ref Range   Protein S Activity 118 60 - 145 %    Comment: (NOTE) Performed At: [facility] [address] [place], [place] [id number] Lindon Romp MD BX:[id number]   Protein S, total     Status: None   Collection Time: 01/05/15  8:50 PM  Result Value Ref Range   Protein S Ag, Total 148 58 - 150 %    Comment: (NOTE) Performed At: [facility] [place], [place] [id number] Lindon Romp MD KH:[id number]   Lupus anticoagulant panel     Status: Abnormal   Collection Time: 01/05/15  8:50 PM  Result Value Ref Range   PTT Lupus Anticoagulant 55.2 (H) 0.0 - 50.0 sec   DRVVT  51.4 0.0 - 55.1 sec   Lupus Anticoag Interp Comment:     Comment: (NOTE) Results are consistent with the presence of a lupus anticoagulant. NOTE: Only persistent lupus anticoagulants are thought to be of clinical significance. For this reason, repeat testing in 12 or more weeks after an initial positive result should be considered to confirm or refute the presence of a lupus anticoagulant, depending on clinical presentation. Performed At: [facility] [place], [place] [id number] Lindon Romp MD HW:[id number]   Beta-2-glycoprotein i abs, IgG/M/A     Status: None   Collection Time: 01/05/15  8:50 PM  Result Value Ref Range   Beta-2 Glyco I IgG <9 0 - 20 GPI IgG units    Comment: (NOTE) The reference interval reflects a 3SD or 99th percentile interval, which is thought to represent a potentially clinically significant result in accordance with the International Consensus Statement on the classification criteria for definitive antiphospholipid syndrome (APS). J Thromb Haem 2006;4:295-306.  Beta-2-Glycoprotein I IgM <9 0 - 32 GPI IgM units    Comment: (NOTE) The reference interval reflects a 3SD or 99th percentile interval, which is thought to represent a potentially clinically significant result in accordance with the International Consensus Statement on the classification criteria for definitive antiphospholipid syndrome (APS). J Thromb Haem 2006;4:295-306. Performed At: [facility] [place], [place] [id number] Lindon Romp MD [id number]    Beta-2-Glycoprotein I IgA <9 0 - 25 GPI IgA units    Comment: (NOTE) The reference interval reflects a 3SD or 99th percentile interval, which is thought to represent a potentially clinically significant result in accordance with the International Consensus Statement on the classification criteria for definitive antiphospholipid syndrome (APS). J Thromb Haem 2006;4:295-306.     Homocysteine, serum     Status: Abnormal   Collection Time: 01/05/15  8:50 PM  Result Value Ref Range   Homocysteine 18.7 (H) 0.0 - 15.0 umol/L    Comment: (NOTE) Performed At: [facility] [place], [place] [id number] Lindon Romp MD VW:[id number]   Factor 5 leiden     Status: None   Collection Time: 01/05/15  8:50 PM  Result Value Ref Range   Recommendations-F5LEID: Comment     Comment: (NOTE) Result:  Negative (no mutation found) Factor V Leiden is a specific mutation (R506Q) in the factor V gene that is associated with an increased risk of venous thrombosis. Factor V Leiden is more resistant to inactivation by activated protein C.  As a result, factor V persists in the circulation leading to a mild hyper- coagulable state.  The Leiden mutation accounts for 90% - 95% of APC resistance.  Factor V Leiden has been reported in patients with deep vein thrombosis, pulmonary embolus, central retinal vein occlusion, cerebral sinus thrombosis and hepatic vein thrombosis. Other risk factors to be considered in the workup for venous thrombosis include the G20210A mutation in the factor II (prothrombin) gene, protein S and C deficiency, and antithrombin deficiencies. Anticardiolipin antibody and lupus anticoagulant analysis may be appropriate for certain patients, as well as homocysteine levels. Contact your local [facility] for information on how to order additi onal testing if desired.    Comment Comment     Comment: (NOTE) **Genetic counselors are available for health care providers to**  discuss results at [phone]). Methodology: DNA analysis of the Factor V gene was performed by allele-specific PCR. The diagnostic sensitivity and specificity is >99% for both. Molecular-based testing is highly accurate, but as in any laboratory test, diagnostic errors may occur. All test results must be combined with clinical information for the most  accurate interpretation. References: Voelkerding K (1996).  Clin Lab Med [phone]. Allison Quarry, PhD, [facility] Edinson Reason, PhD, [facility] Jens Som, PhD, [facility] Annetta Maw, M.S., PhD, [facility] Alfredo Bach, PhD, [facility] Norva Riffle, PhD, [facility] Earlean Polka PhD, [facility] Performed At: [facility] [address] [place], [place] [id number] Nechama Guard MD HQ:[id number]   Prothrombin gene mutation     Status: None   Collection Time: 01/05/15  8:50 PM  Result Value Ref Range   Recommendations-PTGENE: Comment     Comment: (NOTE) NEGATIVE No mutation identified. Comment: A point mutation (G20210A) in the factor II (prothrombin) gene is the second most common cause of inherited thrombophilia. The incidence of this mutation in the [place] Caucasian population is about 2% and in the [place] American population it is approximately 0.5%. This mutation is rare in the Asian  and Native American population. Being heterozygous for a prothrombin mutation increases the risk for developing venous thrombosis about 2 to 3 times above the general population risk. Being homozygous for the prothrombin gene mutation increases the relative risk for venous thrombosis further, although it is not yet known how much further the risk is increased. In women heterozygous for the prothrombin gene mutation, the use of estrogen containing oral contraceptives increases the relative risk of venous thrombosis about 16 times and the risk of developing cerebral thrombosis is also significantly increased. In pregnancy the pr othrombin gene mutation increases risk for venous thrombosis and may increase risk for stillbirth, placental abruption, pre-eclampsia and fetal growth restriction. If the patient possesses two or more congenital or acquired thrombophilic risk factors, the risk for thrombosis may rise to more than the sum of the risk ratios for the individual mutations. This assay detects  only the prothrombin G20210A mutation and does not measure genetic abnormalities elsewhere in the genome. Other thrombotic risk factors may be pursued through systematic clinical laboratory analysis. These factors include the R506Q (Leiden) mutation in the Factor V gene, plasma homocysteine levels, as well as testing for deficiencies of antithrombin III, protein C and protein S.    Additional Information Comment     Comment: (NOTE) Genetic Counselors are available for health care providers to discuss results at [phone]). Methodology: DNA analysis of the Factor II gene was performed by PCR amplification followed by restriction analysis. The diagnostic sensitivity is >99% for both. All the tests must be combined with clinical information for the most accurate interpretation. Molecular-based testing is highly accurate, but as in any laboratory test, diagnostic errors may occur. Poort SR, et al. Blood. 1996; 48:1856-3149. Varga EA. Circulation. 2004; 702:O37-C58. Mervin Hack, et [place]; 19:700-703. Allison Quarry, PhD, [facility] Archimedes Reason, PhD, [facility] Jens Som, PhD, [facility] Annetta Maw, M.S., PhD, [facility] Alfredo Bach, PhD, [facility] Norva Riffle, PhD, [facility] Earlean Polka, PhD, [facility] Performed At: [facility] [address] [place], [place] [id number] Nechama Guard MD IN:[id number]   Cardiolipin antibodies, IgG, IgM, IgA     Status: None   Collection Time: 01/05/15  8:50 PM  Result Value Ref Range   Anticardiolipin IgG <9 0 - 14 GPL U/mL    Comment: (NOTE)                          Negative:              <15                          Indeterminate:     15 - 20                          Low-Med Positive: >20 - 80                          High Positive:         >80    Anticardiolipin IgM <9 0 - 12 MPL U/mL    Comment: (NOTE)                          Negative:              <13  Indeterminate:     13 - 20                          Low-Med Positive: >20 - 80                          High Positive:         >80    Anticardiolipin IgA <9 0 - 11 APL U/mL    Comment: (NOTE)                          Negative:              <12                          Indeterminate:     12 - 20                          Low-Med Positive: >20 - 80                          High Positive:         >80 Performed At: [facility] [address] [place], [place] [id number] Lindon Romp MD CW:[id number]   Troponin I     Status: None   Collection Time: 01/05/15  8:50 PM  Result Value Ref Range   Troponin I <0.03 <0.031 ng/mL    Comment:        NO INDICATION OF MYOCARDIAL INJURY.   PTT-LA Mix     Status: None   Collection Time: 01/05/15  8:50 PM  Result Value Ref Range   PTT-LA Mix 44.8 0.0 - 50.0 sec    Comment: (NOTE) Performed At: [facility] [place], [place] [id number] Lindon Romp MD GG:[id number]   PTT-LA Incub Mix     Status: Abnormal   Collection Time: 01/05/15  8:50 PM  Result Value Ref Range   PTT-LA INCUB MIX 59.5 (H) 0.0 - 50.0 sec    Comment: (NOTE) Performed At: [facility] [place], [place] [id number] Lindon Romp MD [id number]   Hexagonal Phase Phospholipid     Status: Abnormal   Collection Time: 01/05/15  8:50 PM  Result Value Ref Range   Hexagonal Phase Phospholipid 19.3 (H) 0.0 - 8.0 sec    Comment: (NOTE) Performed At: [facility] [place], [place] [id number] Lindon Romp MD WC:[id number]   CBC     Status: Abnormal   Collection Time: 01/06/15  1:00 AM  Result Value Ref Range   WBC 7.5 4.0 - 10.5 K/uL   RBC 4.70 4.22 - 5.81 MIL/uL   Hemoglobin 14.5 13.0 - 17.0 g/dL   HCT 43.8 39.0 - 52.0 %   MCV 93.2 78.0 - 100.0 fL   MCH 30.9 26.0 - 34.0 pg   MCHC 33.1 30.0 - 36.0 g/dL   RDW 16.0 (H) 11.5 - 15.5 %   Platelets 141 (L) 150 - 400 K/uL  Basic metabolic  panel     Status: Abnormal   Collection Time: 01/06/15  1:00 AM  Result Value Ref Range   Sodium 138 135 - 145 mmol/L   Potassium 3.9 3.5 - 5.1 mmol/L   Chloride 104 101 - 111 mmol/L  CO2 26 22 - 32 mmol/L   Glucose, Bld 97 65 - 99 mg/dL   BUN 13 6 - 20 mg/dL   Creatinine, Ser 1.19 0.61 - 1.24 mg/dL   Calcium 8.7 (L) 8.9 - 10.3 mg/dL   GFR calc non Af Amer >60 >60 mL/min   GFR calc Af Amer >60 >60 mL/min    Comment: (NOTE) The eGFR has been calculated using the CKD EPI equation. This calculation has not been validated in all clinical situations. eGFR's persistently <60 mL/min signify possible Chronic Kidney Disease.    Anion gap 8 5 - 15  Magnesium     Status: None   Collection Time: 01/06/15  1:00 AM  Result Value Ref Range   Magnesium 1.7 1.7 - 2.4 mg/dL  Phosphorus     Status: None   Collection Time: 01/06/15  1:00 AM  Result Value Ref Range   Phosphorus 2.9 2.5 - 4.6 mg/dL  APTT     Status: Abnormal   Collection Time: 01/06/15  1:00 AM  Result Value Ref Range   aPTT 64 (H) 24 - 37 seconds    Comment:        IF BASELINE aPTT IS ELEVATED, SUGGEST PATIENT RISK ASSESSMENT BE USED TO DETERMINE APPROPRIATE ANTICOAGULANT THERAPY.   Heparin level (unfractionated)     Status: Abnormal   Collection Time: 01/06/15  1:00 AM  Result Value Ref Range   Heparin Unfractionated >2.20 (H) 0.30 - 0.70 IU/mL    Comment: RESULTS CONFIRMED BY MANUAL DILUTION RBV GRAHAM,S RN [phone] [place],N CORRECTED ON 08/02 AT 0323: PREVIOUSLY REPORTED AS 0.94 RESULTS CONFIRMED BY MANUAL DILUTION        IF HEPARIN RESULTS ARE BELOW EXPECTED VALUES, AND PATIENT DOSAGE HAS BEEN CONFIRMED, SUGGEST FOLLOW UP TESTING OF ANTITHROMBIN III LEVELS.   Heparin level (unfractionated)     Status: Abnormal   Collection Time: 01/06/15 10:25 AM  Result Value Ref Range   Heparin Unfractionated 2.04 (H) 0.30 - 0.70 IU/mL    Comment: RESULTS CONFIRMED BY MANUAL DILUTION        IF HEPARIN RESULTS ARE  BELOW EXPECTED VALUES, AND PATIENT DOSAGE HAS BEEN CONFIRMED, SUGGEST FOLLOW UP TESTING OF ANTITHROMBIN III LEVELS.   APTT     Status: Abnormal   Collection Time: 01/06/15 10:25 AM  Result Value Ref Range   aPTT 49 (H) 24 - 37 seconds    Comment:        IF BASELINE aPTT IS ELEVATED, SUGGEST PATIENT RISK ASSESSMENT BE USED TO DETERMINE APPROPRIATE ANTICOAGULANT THERAPY.   APTT     Status: Abnormal   Collection Time: 01/06/15  6:11 PM  Result Value Ref Range   aPTT 60 (H) 24 - 37 seconds    Comment:        IF BASELINE aPTT IS ELEVATED, SUGGEST PATIENT RISK ASSESSMENT BE USED TO DETERMINE APPROPRIATE ANTICOAGULANT THERAPY.   Heparin level (unfractionated)     Status: None   Collection Time: 01/07/15  3:33 AM  Result Value Ref Range   Heparin Unfractionated 0.66 0.30 - 0.70 IU/mL    Comment:        IF HEPARIN RESULTS ARE BELOW EXPECTED VALUES, AND PATIENT DOSAGE HAS BEEN CONFIRMED, SUGGEST FOLLOW UP TESTING OF ANTITHROMBIN III LEVELS.   CBC     Status: Abnormal   Collection Time: 01/07/15  3:33 AM  Result Value Ref Range   WBC 8.1 4.0 - 10.5 K/uL   RBC 4.54 4.22 - 5.81 MIL/uL  Hemoglobin 14.2 13.0 - 17.0 g/dL   HCT 42.2 39.0 - 52.0 %   MCV 93.0 78.0 - 100.0 fL   MCH 31.3 26.0 - 34.0 pg   MCHC 33.6 30.0 - 36.0 g/dL   RDW 15.6 (H) 11.5 - 15.5 %   Platelets 145 (L) 150 - 400 K/uL  Protime-INR     Status: Abnormal   Collection Time: 01/07/15  3:33 AM  Result Value Ref Range   Prothrombin Time 16.8 (H) 11.6 - 15.2 seconds   INR 1.35 0.00 - 1.49  APTT     Status: Abnormal   Collection Time: 01/07/15  3:33 AM  Result Value Ref Range   aPTT 73 (H) 24 - 37 seconds    Comment:        IF BASELINE aPTT IS ELEVATED, SUGGEST PATIENT RISK ASSESSMENT BE USED TO DETERMINE APPROPRIATE ANTICOAGULANT THERAPY.   Heparin level (unfractionated)     Status: None   Collection Time: 01/07/15 11:25 AM  Result Value Ref Range   Heparin Unfractionated 0.55 0.30 - 0.70 IU/mL     Comment:        IF HEPARIN RESULTS ARE BELOW EXPECTED VALUES, AND PATIENT DOSAGE HAS BEEN CONFIRMED, SUGGEST FOLLOW UP TESTING OF ANTITHROMBIN III LEVELS.   Glucose, capillary     Status: Abnormal   Collection Time: 01/07/15 12:37 PM  Result Value Ref Range   Glucose-Capillary 181 (H) 65 - 99 mg/dL  Heparin level (unfractionated)     Status: Abnormal   Collection Time: 01/08/15  2:28 AM  Result Value Ref Range   Heparin Unfractionated 0.27 (L) 0.30 - 0.70 IU/mL    Comment:        IF HEPARIN RESULTS ARE BELOW EXPECTED VALUES, AND PATIENT DOSAGE HAS BEEN CONFIRMED, SUGGEST FOLLOW UP TESTING OF ANTITHROMBIN III LEVELS.   Basic metabolic panel     Status: Abnormal   Collection Time: 01/08/15  2:28 AM  Result Value Ref Range   Sodium 137 135 - 145 mmol/L   Potassium 3.9 3.5 - 5.1 mmol/L   Chloride 101 101 - 111 mmol/L   CO2 29 22 - 32 mmol/L   Glucose, Bld 114 (H) 65 - 99 mg/dL   BUN 14 6 - 20 mg/dL   Creatinine, Ser 1.02 0.61 - 1.24 mg/dL   Calcium 8.7 (L) 8.9 - 10.3 mg/dL   GFR calc non Af Amer >60 >60 mL/min   GFR calc Af Amer >60 >60 mL/min    Comment: (NOTE) The eGFR has been calculated using the CKD EPI equation. This calculation has not been validated in all clinical situations. eGFR's persistently <60 mL/min signify possible Chronic Kidney Disease.    Anion gap 7 5 - 15  CBC     Status: Abnormal   Collection Time: 01/08/15  2:28 AM  Result Value Ref Range   WBC 11.2 (H) 4.0 - 10.5 K/uL   RBC 4.47 4.22 - 5.81 MIL/uL   Hemoglobin 13.8 13.0 - 17.0 g/dL   HCT 40.9 39.0 - 52.0 %   MCV 91.5 78.0 - 100.0 fL   MCH 30.9 26.0 - 34.0 pg   MCHC 33.7 30.0 - 36.0 g/dL   RDW 15.4 11.5 - 15.5 %   Platelets 178 150 - 400 K/uL  Protime-INR     Status: Abnormal   Collection Time: 01/08/15  2:28 AM  Result Value Ref Range   Prothrombin Time 15.4 (H) 11.6 - 15.2 seconds   INR 1.20 0.00 - 1.49  APTT  Status: Abnormal   Collection Time: 01/08/15  2:28 AM  Result Value Ref  Range   aPTT 54 (H) 24 - 37 seconds    Comment:        IF BASELINE aPTT IS ELEVATED, SUGGEST PATIENT RISK ASSESSMENT BE USED TO DETERMINE APPROPRIATE ANTICOAGULANT THERAPY.   MRSA PCR Screening     Status: None   Collection Time: 01/08/15  9:14 AM  Result Value Ref Range   MRSA by PCR NEGATIVE NEGATIVE    Comment:        The GeneXpert MRSA Assay (FDA approved for NASAL specimens only), is one component of a comprehensive MRSA colonization surveillance program. It is not intended to diagnose MRSA infection nor to guide or monitor treatment for MRSA infections.   Heparin level (unfractionated)     Status: None   Collection Time: 01/08/15 11:58 AM  Result Value Ref Range   Heparin Unfractionated 0.30 0.30 - 0.70 IU/mL    Comment:        IF HEPARIN RESULTS ARE BELOW EXPECTED VALUES, AND PATIENT DOSAGE HAS BEEN CONFIRMED, SUGGEST FOLLOW UP TESTING OF ANTITHROMBIN III LEVELS.   APTT     Status: Abnormal   Collection Time: 01/08/15 11:58 AM  Result Value Ref Range   aPTT 54 (H) 24 - 37 seconds    Comment:        IF BASELINE aPTT IS ELEVATED, SUGGEST PATIENT RISK ASSESSMENT BE USED TO DETERMINE APPROPRIATE ANTICOAGULANT THERAPY.   Heparin level (unfractionated)     Status: None   Collection Time: 01/08/15 12:29 PM  Result Value Ref Range   Heparin Unfractionated 0.30 0.30 - 0.70 IU/mL    Comment:        IF HEPARIN RESULTS ARE BELOW EXPECTED VALUES, AND PATIENT DOSAGE HAS BEEN CONFIRMED, SUGGEST FOLLOW UP TESTING OF ANTITHROMBIN III LEVELS.   CBC     Status: Abnormal   Collection Time: 01/08/15 12:29 PM  Result Value Ref Range   WBC 11.6 (H) 4.0 - 10.5 K/uL   RBC 4.59 4.22 - 5.81 MIL/uL   Hemoglobin 14.3 13.0 - 17.0 g/dL   HCT 43.0 39.0 - 52.0 %   MCV 93.7 78.0 - 100.0 fL   MCH 31.2 26.0 - 34.0 pg   MCHC 33.3 30.0 - 36.0 g/dL   RDW 15.5 11.5 - 15.5 %   Platelets 188 150 - 400 K/uL  Fibrinogen     Status: Abnormal   Collection Time: 01/08/15 12:29 PM  Result  Value Ref Range   Fibrinogen 773 (H) 204 - 475 mg/dL  Heparin level (unfractionated)     Status: None   Collection Time: 01/08/15  5:42 PM  Result Value Ref Range   Heparin Unfractionated 0.33 0.30 - 0.70 IU/mL    Comment:        IF HEPARIN RESULTS ARE BELOW EXPECTED VALUES, AND PATIENT DOSAGE HAS BEEN CONFIRMED, SUGGEST FOLLOW UP TESTING OF ANTITHROMBIN III LEVELS.   CBC     Status: None   Collection Time: 01/08/15  5:42 PM  Result Value Ref Range   WBC 8.3 4.0 - 10.5 K/uL   RBC 4.31 4.22 - 5.81 MIL/uL   Hemoglobin 13.5 13.0 - 17.0 g/dL   HCT 39.4 39.0 - 52.0 %   MCV 91.4 78.0 - 100.0 fL   MCH 31.3 26.0 - 34.0 pg   MCHC 34.3 30.0 - 36.0 g/dL   RDW 15.4 11.5 - 15.5 %   Platelets 156 150 - 400 K/uL  Fibrinogen  Status: None   Collection Time: 01/08/15  5:42 PM  Result Value Ref Range   Fibrinogen 289 204 - 475 mg/dL  CBC     Status: None   Collection Time: 01/08/15 11:10 PM  Result Value Ref Range   WBC 9.3 4.0 - 10.5 K/uL   RBC 4.34 4.22 - 5.81 MIL/uL   Hemoglobin 13.4 13.0 - 17.0 g/dL   HCT 39.7 39.0 - 52.0 %   MCV 91.5 78.0 - 100.0 fL   MCH 30.9 26.0 - 34.0 pg   MCHC 33.8 30.0 - 36.0 g/dL   RDW 15.3 11.5 - 15.5 %   Platelets 162 150 - 400 K/uL  Fibrinogen     Status: Abnormal   Collection Time: 01/08/15 11:10 PM  Result Value Ref Range   Fibrinogen 182 (L) 204 - 475 mg/dL  Heparin level (unfractionated)     Status: None   Collection Time: 01/08/15 11:10 PM  Result Value Ref Range   Heparin Unfractionated 0.36 0.30 - 0.70 IU/mL    Comment:        IF HEPARIN RESULTS ARE BELOW EXPECTED VALUES, AND PATIENT DOSAGE HAS BEEN CONFIRMED, SUGGEST FOLLOW UP TESTING OF ANTITHROMBIN III LEVELS.   Heparin level (unfractionated)     Status: None   Collection Time: 01/09/15  5:00 AM  Result Value Ref Range   Heparin Unfractionated 0.38 0.30 - 0.70 IU/mL    Comment:        IF HEPARIN RESULTS ARE BELOW EXPECTED VALUES, AND PATIENT DOSAGE HAS BEEN CONFIRMED, SUGGEST  FOLLOW UP TESTING OF ANTITHROMBIN III LEVELS.   Basic metabolic panel     Status: Abnormal   Collection Time: 01/09/15  5:00 AM  Result Value Ref Range   Sodium 137 135 - 145 mmol/L   Potassium 4.1 3.5 - 5.1 mmol/L   Chloride 101 101 - 111 mmol/L   CO2 28 22 - 32 mmol/L   Glucose, Bld 120 (H) 65 - 99 mg/dL   BUN 14 6 - 20 mg/dL   Creatinine, Ser 1.05 0.61 - 1.24 mg/dL   Calcium 8.4 (L) 8.9 - 10.3 mg/dL   GFR calc non Af Amer >60 >60 mL/min   GFR calc Af Amer >60 >60 mL/min    Comment: (NOTE) The eGFR has been calculated using the CKD EPI equation. This calculation has not been validated in all clinical situations. eGFR's persistently <60 mL/min signify possible Chronic Kidney Disease.    Anion gap 8 5 - 15  Factor 5 leiden     Status: None   Collection Time: 01/09/15  5:00 AM  Result Value Ref Range   Recommendations-F5LEID: Comment     Comment: (NOTE) Result:  Negative (no mutation found) Factor V Leiden is a specific mutation (R506Q) in the factor V gene that is associated with an increased risk of venous thrombosis. Factor V Leiden is more resistant to inactivation by activated protein C.  As a result, factor V persists in the circulation leading to a mild hyper- coagulable state.  The Leiden mutation accounts for 90% - 95% of APC resistance.  Factor V Leiden has been reported in patients with deep vein thrombosis, pulmonary embolus, central retinal vein occlusion, cerebral sinus thrombosis and hepatic vein thrombosis. Other risk factors to be considered in the workup for venous thrombosis include the G20210A mutation in the factor II (prothrombin) gene, protein S and C deficiency, and antithrombin deficiencies. Anticardiolipin antibody and lupus anticoagulant analysis may be appropriate for certain patients, as well  as homocysteine levels. Contact your local [facility] for information on how to order additi onal testing if desired.    Comment Comment     Comment:  (NOTE) **Genetic counselors are available for health care providers to**  discuss results at [phone]). Methodology: DNA analysis of the Factor V gene was performed by allele-specific PCR. The diagnostic sensitivity and specificity is >99% for both. Molecular-based testing is highly accurate, but as in any laboratory test, diagnostic errors may occur. All test results must be combined with clinical information for the most accurate interpretation. References: Voelkerding K (1996).  Clin Lab Med [phone]. Allison Quarry, PhD, [facility] [place] Reason, PhD, [facility] Jens Som, PhD, [facility] Annetta Maw, M.S., PhD, [facility] Alfredo Bach, PhD, [facility] Norva Riffle, PhD, [facility] Earlean Polka PhD, [facility] Performed At: [facility] [place], [place] [id number] Nechama Guard MD [id number]   Sedimentation rate     Status: None   Collection Time: 01/09/15  5:00 AM  Result Value Ref Range   Sed Rate 5 0 - 16 mm/hr  Prothrombin gene mutation     Status: None   Collection Time: 01/09/15  5:00 AM  Result Value Ref Range   Recommendations-PTGENE: Comment     Comment: (NOTE) NEGATIVE No mutation identified. Comment: A point mutation (G20210A) in the factor II (prothrombin) gene is the second most common cause of inherited thrombophilia. The incidence of this mutation in the [place] Caucasian population is about 2% and in the [place] American population it is approximately 0.5%. This mutation is rare in the [place] and Native American population. Being heterozygous for a prothrombin mutation increases the risk for developing venous thrombosis about 2 to 3 times above the general population risk. Being homozygous for the prothrombin gene mutation increases the relative risk for venous thrombosis further, although it is not yet known how much further the risk is increased. In women heterozygous for the prothrombin gene mutation, the use of  estrogen containing oral contraceptives increases the relative risk of venous thrombosis about 16 times and the risk of developing cerebral thrombosis is also significantly increased. In pregnancy the pr othrombin gene mutation increases risk for venous thrombosis and may increase risk for stillbirth, placental abruption, pre-eclampsia and fetal growth restriction. If the patient possesses two or more congenital or acquired thrombophilic risk factors, the risk for thrombosis may rise to more than the sum of the risk ratios for the individual mutations. This assay detects only the prothrombin G20210A mutation and does not measure genetic abnormalities elsewhere in the genome. Other thrombotic risk factors may be pursued through systematic clinical laboratory analysis. These factors include the R506Q (Leiden) mutation in the Factor V gene, plasma homocysteine levels, as well as testing for deficiencies of antithrombin III, protein C and protein S.    Additional Information Comment     Comment: (NOTE) Genetic Counselors are available for health care providers to discuss results at [phone]). Methodology: DNA analysis of the Factor II gene was performed by PCR amplification followed by restriction analysis. The diagnostic sensitivity is >99% for both. All the tests must be combined with clinical information for the most accurate interpretation. Molecular-based testing is highly accurate, but as in any laboratory test, diagnostic errors may occur. Poort SR, et al. Blood. 1996; 01:0071-2197. Varga EA. Circulation. 2004; 588:T25-Q98. Mervin Hack, et [place]; 19:700-703. Allison Quarry, PhD, [facility] [place] Reason, PhD, [facility] Jens Som, PhD, [facility] Annetta Maw, M.S.,  PhD, [facility] Alfredo Bach, PhD, [facility] Norva Riffle, PhD, [facility] Earlean Polka, PhD, [facility] Performed At: [facility] [address] [place], [place]  [id number] Nechama Guard MD [id number]   Homocysteine     Status: None   Collection Time: 01/09/15  5:00 AM  Result Value Ref Range   Homocysteine 10.6 0.0 - 15.0 umol/L    Comment: (NOTE) Performed At: [facility] [place], [place] [id number] Lindon Romp MD [id number]   CBC     Status: None   Collection Time: 01/09/15  5:00 AM  Result Value Ref Range   WBC 9.0 4.0 - 10.5 K/uL   RBC 4.34 4.22 - 5.81 MIL/uL   Hemoglobin 13.2 13.0 - 17.0 g/dL   HCT 39.9 39.0 - 52.0 %   MCV 91.9 78.0 - 100.0 fL   MCH 30.4 26.0 - 34.0 pg   MCHC 33.1 30.0 - 36.0 g/dL   RDW 15.4 11.5 - 15.5 %   Platelets 167 150 - 400 K/uL  Fibrinogen     Status: Abnormal   Collection Time: 01/09/15  5:00 AM  Result Value Ref Range   Fibrinogen 171 (L) 204 - 475 mg/dL  Heparin level (unfractionated)     Status: None   Collection Time: 01/09/15  5:00 AM  Result Value Ref Range   Heparin Unfractionated 0.42 0.30 - 0.70 IU/mL    Comment:        IF HEPARIN RESULTS ARE BELOW EXPECTED VALUES, AND PATIENT DOSAGE HAS BEEN CONFIRMED, SUGGEST FOLLOW UP TESTING OF ANTITHROMBIN III LEVELS.   Antinuclear Antibodies, IFA     Status: None   Collection Time: 01/09/15  5:10 AM  Result Value Ref Range   ANA Ab, IFA Negative     Comment: (NOTE)                                     Negative   <1:80                                     Borderline  1:80                                     Positive   >1:80 Performed At: [facility] [place], [place] [id number] Lindon Romp MD [id number]   Fibrinogen     Status: Abnormal   Collection Time: 01/09/15 11:38 AM  Result Value Ref Range   Fibrinogen 185 (L) 204 - 475 mg/dL  Heparin level (unfractionated)     Status: None   Collection Time: 01/09/15 11:38 AM  Result Value Ref Range   Heparin Unfractionated 0.62 0.30 - 0.70 IU/mL    Comment:        IF HEPARIN RESULTS ARE BELOW EXPECTED VALUES, AND  PATIENT DOSAGE HAS BEEN CONFIRMED, SUGGEST FOLLOW UP TESTING OF ANTITHROMBIN III LEVELS.   Heparin level (unfractionated)     Status: None   Collection Time: 01/09/15  6:58 PM  Result Value Ref Range   Heparin Unfractionated 0.40 0.30 - 0.70 IU/mL    Comment:        IF HEPARIN RESULTS ARE BELOW EXPECTED VALUES, AND PATIENT DOSAGE HAS BEEN CONFIRMED, SUGGEST FOLLOW UP TESTING OF ANTITHROMBIN III  LEVELS.   Fibrinogen     Status: None   Collection Time: 01/09/15  6:58 PM  Result Value Ref Range   Fibrinogen 227 204 - 475 mg/dL  Fibrinogen     Status: None   Collection Time: 01/09/15 11:31 PM  Result Value Ref Range   Fibrinogen 258 204 - 475 mg/dL  Heparin level (unfractionated)     Status: None   Collection Time: 01/09/15 11:31 PM  Result Value Ref Range   Heparin Unfractionated 0.37 0.30 - 0.70 IU/mL    Comment:        IF HEPARIN RESULTS ARE BELOW EXPECTED VALUES, AND PATIENT DOSAGE HAS BEEN CONFIRMED, SUGGEST FOLLOW UP TESTING OF ANTITHROMBIN III LEVELS.   Fibrinogen     Status: None   Collection Time: 01/10/15  5:50 AM  Result Value Ref Range   Fibrinogen 280 204 - 475 mg/dL  Heparin level (unfractionated)     Status: None   Collection Time: 01/10/15  5:50 AM  Result Value Ref Range   Heparin Unfractionated 0.41 0.30 - 0.70 IU/mL    Comment:        IF HEPARIN RESULTS ARE BELOW EXPECTED VALUES, AND PATIENT DOSAGE HAS BEEN CONFIRMED, SUGGEST FOLLOW UP TESTING OF ANTITHROMBIN III LEVELS.   Fibrinogen     Status: None   Collection Time: 01/10/15  1:47 PM  Result Value Ref Range   Fibrinogen 280 204 - 475 mg/dL  Heparin level (unfractionated)     Status: None   Collection Time: 01/10/15  1:47 PM  Result Value Ref Range   Heparin Unfractionated 0.53 0.30 - 0.70 IU/mL    Comment:        IF HEPARIN RESULTS ARE BELOW EXPECTED VALUES, AND PATIENT DOSAGE HAS BEEN CONFIRMED, SUGGEST FOLLOW UP TESTING OF ANTITHROMBIN III LEVELS.   Fibrinogen     Status: None    Collection Time: 01/10/15  5:25 PM  Result Value Ref Range   Fibrinogen 274 204 - 475 mg/dL  Heparin level (unfractionated)     Status: None   Collection Time: 01/10/15  5:25 PM  Result Value Ref Range   Heparin Unfractionated 0.46 0.30 - 0.70 IU/mL    Comment:        IF HEPARIN RESULTS ARE BELOW EXPECTED VALUES, AND PATIENT DOSAGE HAS BEEN CONFIRMED, SUGGEST FOLLOW UP TESTING OF ANTITHROMBIN III LEVELS.   Fibrinogen     Status: None   Collection Time: 01/10/15 11:25 PM  Result Value Ref Range   Fibrinogen 246 204 - 475 mg/dL  Fibrinogen     Status: Abnormal   Collection Time: 01/11/15  5:48 AM  Result Value Ref Range   Fibrinogen 203 (L) 204 - 475 mg/dL  Basic metabolic panel     Status: Abnormal   Collection Time: 01/11/15  5:48 AM  Result Value Ref Range   Sodium 141 135 - 145 mmol/L   Potassium 4.2 3.5 - 5.1 mmol/L   Chloride 104 101 - 111 mmol/L   CO2 31 22 - 32 mmol/L   Glucose, Bld 109 (H) 65 - 99 mg/dL   BUN 16 6 - 20 mg/dL   Creatinine, Ser 0.96 0.61 - 1.24 mg/dL   Calcium 8.9 8.9 - 10.3 mg/dL   GFR calc non Af Amer >60 >60 mL/min   GFR calc Af Amer >60 >60 mL/min    Comment: (NOTE) The eGFR has been calculated using the CKD EPI equation. This calculation has not been validated in all clinical situations. eGFR's persistently <  60 mL/min signify possible Chronic Kidney Disease.    Anion gap 6 5 - 15    RADIOGRAPHIC STUDIES: I have personally reviewed the radiological images as listed and agreed with the findings in the report.  Ct Angio Chest Pe W/cm &/or Wo Cm 01/05/2015   IMPRESSION: 1. Bilateral pulmonary embolism but much more significant on the right side with a large central right-sided PE. Mild right ventricular heart strain with slight flattening of the left ventricle. The RV LV ratio is 0.92. 2. No significant pulmonary findings. 3. Normal thoracic aorta.  These results will be called to the ordering clinician or representative by the Radiologist  Assistant, and communication documented in the PACS or zVision Dashboard.   Electronically Signed   By: Marijo Sanes M.D.   On: 01/05/2015 13:12   Ir Veno/ext/uni Left 01/09/2015    IMPRESSION: 1. Extensive occlusive deep venous thrombus through the left posterior tibial, popliteal and femoral veins. 2. After mechanical thrombectomy with diluted tPA, significant thrombus remain present. Thrombolytic infusion catheter was placed via posterior tibial access and infusion of tPA begun at 1 milligram/hour. This will be continued overnight. 3. Venography demonstrates chronic appearing stenosis and interruption of the central left common iliac vein with prominent collateral outflow into the contralateral right external iliac vein. This appears consistent with chronic stenosis from May Thurner syndrome and/or congenital interruption/duplication. This will be re-assessed after thrombolytic therapy to determine whether angioplasty and/or stent placement may be needed to improve iliac venous outflow.   Electronically Signed   By: Aletta Edouard M.D.   On: 01/09/2015 08:07    [place] Angioplasty 01/12/2015    IMPRESSION: 1. Initial venography demonstrates minimal residual nonocclusive thrombus below the knee and in the popliteal veins, duplicated popliteal veins, a high-grade stenosis in the femoral vein in the distal thigh, and evidence of May Thurner physiology with flow limiting narrowing of the central left common iliac vein. 2. Successful balloon angioplasty of femoral vein stenosis in the distal thigh to 8 mm. 3. Successful placement of a 14 mm self expanding Nitinol stent across the May Thurner lesion. 4. Successful restoration of antegrade flow from the ankle to the IVC with resolution of collateral venous filling.  PLAN: 1. Continue anticoagulation for at least 6 months. 2. Follow-up in interventional radiology clinic in 2-4 weeks with left lower extremity duplex venous  ultrasound.  Signed,  Criselda Peaches, MD  [facility]  [facility]   Electronically Signed   By: Jacqulynn Cadet M.D.   On: 01/12/2015 17:26    ASSESSMENT: 54 year old male without significant past medical history, presented with recurrent extensive DVT and PE. The first episode was probably revoked by spinal surgery, and a second episode was unprovoked. 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  PLAN:  I reviewed with the patient about the plan for care for recurrent DVT/PE.  This last episode of blood clot appeared to be unprovoked, although he does have distal femoral vein stenosis, which likely contributed to the DVT.   Hypercoagulopathy workup He has had complete hypercoagulopathy workup during recent hospitalization, and was negative for prothrombin gene mutation, factor V Leyden mutation, antithrombin III, he had normal protein C and protein S activities, but he had (+) lupus anticoagulant and slightly elevated homocystine level which may contributed to his thrombosis. I'll repeat lupus anticoagulant in  6 month to see if it's still positive.      Duration of anticoagulation Giving the severity of his thrombosis and recurrent episodes, I recommend continue Xarelto  indefinitely. The stent placed in left femoral vein would help the venous flow, but is also a foreign body which can promote blood clots.   Anticoagulation options We discussed about various options of anticoagulation therapies including warfarin, low molecular weight heparin such as Lovenox or newer agents such as Rivaroxaban. Some of the risks and benefits discussed including costs involved, the need for monitoring, risks of life-threatening bleeding/hospitalization, reversibility of each agent in the event of bleeding or overdose, safety profile of each drug and  taking into account other social issues such as ease of administration of medications, etc. Ultimately, we have made an informed decision for the patient to continue his treatment with Xarelto.  Other preventive strategy for DVT  I recommend the patient to use elastic compression stockings at 20-30 mmHg to reduce risks of chronic thrombophlebitis.  Finally, at the end of our consultation today, I reinforced the importance of preventive strategies such as avoiding hormonal supplement, avoiding cigarette smoking, keeping up-to-date with screening programs for early cancer detection, frequent ambulation for long distance travel and aggressive DVT prophylaxis in all surgical settings.  Family counseling Another main issue we discussed today included the role of screening other family members for thrombophilia disorder. At present time, I would not recommend testing the patient's family members as it would not benefit them.He does not have children.    Cancer Screening -Giving the small strong family history of colon cancer, and his prior history of colon polyps, I'll refer him to GI for colonoscopy or other screening method for colon cancer -We discussed he may need to hold Xarelto for one day before the colonoscopy. I recommend him to do the colonoscopy at least 3 months after Xarelto therapy. Alternatively, he can have stool we or CT colonography as a alternative screening tool.    Follow up -I'll see him back in 6 months.I refilled his Xarelto.  -I strongly encouraged him to have a primary care physician and, I asked I will schedule you to get him connected to [facility].  All questions were answered. The patient knows to call the clinic with any problems, questions or concerns. I spent 55 minutes counseling the patient face to face. The total time spent in the appointment was 60 minutes and more than 50% was on counseling.     [name], MD 01/22/2015 5:22 PM

## 2015-02-03 ENCOUNTER — Telehealth: Payer: Self-pay | Admitting: Pulmonary Disease

## 2015-02-03 ENCOUNTER — Encounter: Payer: Self-pay | Admitting: Pulmonary Disease

## 2015-02-03 ENCOUNTER — Ambulatory Visit (INDEPENDENT_AMBULATORY_CARE_PROVIDER_SITE_OTHER): Payer: BLUE CROSS/BLUE SHIELD | Admitting: Pulmonary Disease

## 2015-02-03 VITALS — BP 124/78 | HR 74 | Ht 70.0 in | Wt 216.4 lb

## 2015-02-03 DIAGNOSIS — I82409 Acute embolism and thrombosis of unspecified deep veins of unspecified lower extremity: Secondary | ICD-10-CM | POA: Diagnosis not present

## 2015-02-03 DIAGNOSIS — Z8679 Personal history of other diseases of the circulatory system: Secondary | ICD-10-CM | POA: Insufficient documentation

## 2015-02-03 DIAGNOSIS — Z86711 Personal history of pulmonary embolism: Secondary | ICD-10-CM

## 2015-02-03 DIAGNOSIS — Z7901 Long term (current) use of anticoagulants: Secondary | ICD-10-CM | POA: Diagnosis not present

## 2015-02-03 NOTE — Patient Instructions (Signed)
1. Continue taking Xarelto as prescribed. 2. Keep your follow-up appointment with your hematologist. 3. Please contact my office if you need a prescription for your compression stockings. 4. Be very careful given your increased risk of bleeding on blood thinner. 5. We will see back on an as-needed basis. Please call my clinic if you have any further questions or concerns.

## 2015-02-03 NOTE — Telephone Encounter (Signed)
IMAGING CTA CHEST 01/05/15 (per radiologist): No pericardial effusion. No mediastinal mass or hilar adenopathy. Large filling defect in the right main pulmonary artery extending into right lower lobe pulmonary arteries. Small left-sided pulmonary emboli also noted. Mild right heart strain noted with flattening of the LV. RV/LV ratio 0.92. No acute pulmonary findings. No worrisome pulmonary. Patchy areas of subsegmental atelectasis in right lower lobe. Numerous hepatic cysts noted.  CTA CHEST 05/06/14 (per radiologist): Multiple bilateral pulmonary emboli including bilateral right upper & lower lobes. No emboli within right middle lobe. Emboli are present within left upper lobe as well. RV/LV ratio 0.87. No infiltrates or effusions. Heart size normal. Numerous benign-appearing hepatic cysts.  CARDIAC TTE (01/05/15): LV normal in size with mild LVH. EF 65-70%. LA & RA normal in size. RV normal in size and function. RVSP/pulmonary artery systolic pressure 34 mmHg. IVC normal in size. No aortic stenosis or regurgitation. No mitral stenosis or regurgitation. Mild tricuspid regurgitation. No pericardial effusion.

## 2015-02-03 NOTE — Progress Notes (Signed)
Subjective:    Patient ID: Nicholas [name], male    DOB: 30-Aug-1960, 54 y.o.   MRN: [id number]  C.C.:  Follow-up after hospitalization for recurrent DVT/PE & Systemic Anticoagulation.  HPI Recurrent DVT/PE: Patient initially presented with bilateral pulmonary emboli in December 2015 after recent surgery. He was treated with Xarelto for 6 months which was then discontinued. Patient re-presented to Hospital with DVT/PE. He underwent lytic therapy for residual lower extremity clot and was placed back on Xarelto. Patient was seen by hematology on 01/22/15. His hypercoagulable workup was negative. In reviewing the documentation from this office visit hematology is recommending lifelong anticoagulation. He feels like his dyspnea is improving. He hasn't noticed any coughing.   Systemic Anticoagulation: He is compliant with Xarelto. No visible signs of bleeding. No melena or hematochezia. No hematuria.   Review of Systems  No chest pain or pressure. He reports some near syncope at work. No frank syncope. He does have swelling in his left leg, especially in the afternoon after he has been at work. He still has pain in his left calf. He reports his pain fluctuates from none to a peak of 6-7/10. He reports it is tender to palpation.  No Known Allergies  Current Outpatient Prescriptions on File Prior to Visit  Medication Sig Dispense Refill  . colchicine 0.6 MG tablet Take 1 tablet (0.6 mg total) by mouth daily as needed.    Marland Kitchen omeprazole (PRILOSEC) 20 MG capsule Take 20 mg by mouth daily.    Marland Kitchen oxycodone (OXY-IR) 5 MG capsule Take 5 mg by mouth every 6 (six) hours as needed for pain.    . predniSONE (DELTASONE) 5 MG tablet Take 5 mg by mouth daily as needed. For gout flare up    . probenecid (BENEMID) 500 MG tablet Take 500 mg by mouth daily.     . rivaroxaban (XARELTO) 20 MG TABS tablet 20mg  po daily starting 8/28 30 tablet 5  . zolpidem (AMBIEN) 10 MG tablet Take 10 mg by mouth at bedtime as needed.    Marland Kitchen  losartan (COZAAR) 25 MG tablet Take 25 mg by mouth daily.     No current facility-administered medications on file prior to visit.   Past Medical History  Diagnosis Date  . Hypertension   . GERD (gastroesophageal reflux disease)   . Arthritis     "my whole body"  . Chronic back pain     "mainly lower; some in top too" (03/26/2014)  . History of gout   . Pulmonary embolism     Recurrent & unprovoked August 2016. Provoked/Post-op December 2015  . DVT (deep venous thrombosis)    Past Surgical History  Procedure Laterality Date  . Carpal tunnel release Bilateral 2012  . Lumbar laminectomy/decompression microdiscectomy  03/26/2014    L3-S1  . Inguinal hernia repair Right 2000's  . Back surgery    . Decompressive lumbar laminectomy level 3 N/A 03/26/2014    Procedure: L3-S1 DECOMPRESSION;  Surgeon: Melina Schools, MD;  Location: [place];  Service: Orthopedics;  Laterality: N/A;  . Lytic therapy  August 2016    For Lower Extremity DVT   Family History  Problem Relation Age of Onset  . Cancer Mother 20    colon cancer   . Cancer Paternal Grandmother     colon cancer  . Lung disease Neg Hx    Social History   Social History  . Marital Status: Married    Spouse Name: N/A  . Number of Children:  N/A  . Years of Education: N/A   Social History Main Topics  . Smoking status: Former Smoker -- 1.50 packs/day for 24 years    Types: Cigarettes    Quit date: 06/06/1988  . Smokeless tobacco: Former Systems developer    Types: Snuff, Chew     Comment: Quit chewing tobacco around 2000  . Alcohol Use: 10.8 oz/week    18 Cans of beer per week     Comment: 03/26/2014 "2-3, 12oz beers/day"  . Drug Use: No  . Sexual Activity: Yes   Other Topics Concern  . None   Social History Narrative   Originally from [place]. 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 No pets currently. No bird exposure.       Objective:   Physical Exam Blood pressure 124/78, pulse 74,  height 5\' 10"  (1.778 m), weight 216 lb 6.4 oz (98.158 kg), SpO2 97 %. General:  Awake. Alert. No acute distress. Accompanied by wife today.  Integument:  Warm & dry. No rash on exposed skin. No bruising. Lymphatics:  No appreciated cervical or supraclavicular lymphadenoapthy. HEENT:  Moist mucus membranes. No oral ulcers. No scleral injection or icterus. Minimal nasal turbinate swelling left greater than right. PERRL. Cardiovascular:  Regular rate. No edema. No appreciable JVD.  Pulmonary:  Good aeration & clear to auscultation bilaterally. Symmetric chest wall expansion. No accessory muscle use on room air. Musculoskeletal:  Normal bulk and tone. Hand grip strength 5/5 bilaterally. No joint deformity or effusion appreciated.  IMAGING CTA CHEST 01/05/15 (per radiologist): No pericardial effusion. No mediastinal mass or hilar adenopathy. Large filling defect in the right main pulmonary artery extending into right lower lobe pulmonary arteries. Small left-sided pulmonary emboli also noted. Mild right heart strain noted with flattening of the LV. RV/LV ratio 0.92. No acute pulmonary findings. No worrisome pulmonary. Patchy areas of subsegmental atelectasis in right lower lobe. Numerous hepatic cysts noted.  CTA CHEST 05/06/14 (per radiologist): Multiple bilateral pulmonary emboli including bilateral right upper & lower lobes. No emboli within right middle lobe. Emboli are present within left upper lobe as well. RV/LV ratio 0.87. No infiltrates or effusions. Heart size normal. Numerous benign-appearing hepatic cysts.  CARDIAC TTE (01/05/15): LV normal in size with mild LVH. EF 65-70%. LA & RA normal in size. RV normal in size and function. RVSP/pulmonary artery systolic pressure 34 mmHg. IVC normal in size. No aortic stenosis or regurgitation. No mitral stenosis or regurgitation. Mild tricuspid regurgitation. No pericardial effusion.    Assessment & Plan:  54 year old male status post recurrent DVT/PE  earlier this month. Patient is now status post lytic therapy with placement of lower extremity venous stent. He is currently on systemic anticoagulation after follow-up with hematology. His hypercoagulable workup was negative. Patient's prior echocardiogram showed no evidence of right ventricular strain or cor pulmonale. The patient denies any bleeding. I spent over 50% of his visit today discussing the clotting cascade as well as the mechanism of action of his Xarelto. During this time we also discussed his increased risk of bleeding and need for caution as well as continuous monitoring for signs of blood loss. I instructed the patient to notify my office if he had any new breathing problems as we would be happy to see him back.  1. Recurrent DVT/PE: Following with Dr. Annamaria Boots and on lifelong anticoagulation with Xarelto. No need for further testing or repeat echocardiogram. 2. Lower extremity DVT: Patient's wife is going to investigate compression stockings and  contact my office if she needs a prescription. I did recommend a compression pressure of 20-30 mmHg to the level of the thigh. 3. Lower extremity venous stent: Patient continuing on systemic anticoagulation with Xarelto. 4. Chronic systemic and coagulation: Defer to hematology and patient's future PCP for refills. 5. Follow-up: Patient will return to clinic on an as-needed basis.

## 2015-03-25 ENCOUNTER — Ambulatory Visit
Admission: RE | Admit: 2015-03-25 | Discharge: 2015-03-25 | Disposition: A | Discharge status: Home / Self Care | Payer: BLUE CROSS/BLUE SHIELD | Source: Ambulatory Visit | Attending: Interventional Radiology | Admitting: Interventional Radiology

## 2015-03-25 DIAGNOSIS — I82402 Acute embolism and thrombosis of unspecified deep veins of left lower extremity: Secondary | ICD-10-CM

## 2015-03-25 NOTE — Progress Notes (Addendum)
Chief Complaint: Patient was seen in consultation today for follow-up status post thermal lysis/thrombectomy, angioplasty and stenting of the left lower extremity venous system at the request of Nicholas [name]  Referring Physician(s): Nicholas [name]  History of Present Illness: Nicholas [name] is a 54 y.o. male with a history of provoked right lower extremity DVT and bilateral PE treated for 6 months with Xarelto.   Unfortunately, he developed recurrent DVT in the left lower extremity shortly following cessation of anticoagulation. His left lower extremity DVT was significant and symptomatic. He underwent catheter directed thrombolysis/thrombectomy and was found to have multiple anatomic lesions including a duplicated popliteal vein, a focal stenosis in the femoral vein in the distal thigh and iliac artery compression (May Thurner) syndrome involving the left common iliac vein.  The femoral venous stenosis was angioplastied and the iliac vein compression was stented. This was performed on August 4 and fifth 2016. Patient presents today for follow-up evaluation.  Since his discharge, his leg has slowly improved and is now nearly normal in terms of swelling and symptoms. He still occasionally has some mild swelling at the foot, ankle and lower calf. He continues to take Xarelto and is seeing Dr. Burr Medico of hematology/oncology. He expects to be on Xarelto indefinitely.  He denies active chest pain, shortness of breath, fever, chills or other systemic symptoms.  Past Medical History  Diagnosis Date  . Hypertension   . GERD (gastroesophageal reflux disease)   . Arthritis     "my whole body"  . Chronic back pain     "mainly lower; some in top too" (03/26/2014)  . History of gout   . Pulmonary embolism     Recurrent & unprovoked August 2016. Provoked/Post-op December 2015  . DVT (deep venous thrombosis)     Past Surgical History  Procedure Laterality Date  . Carpal tunnel release  Bilateral 2012  . Lumbar laminectomy/decompression microdiscectomy  03/26/2014    L3-S1  . Inguinal hernia repair Right 2000's  . Back surgery    . Decompressive lumbar laminectomy level 3 N/A 03/26/2014    Procedure: L3-S1 DECOMPRESSION;  Surgeon: Melina Schools, MD;  Location: [place];  Service: Orthopedics;  Laterality: N/A;  . Lytic therapy  August 2016    For Lower Extremity DVT    Allergies: Review of patient's allergies indicates no known allergies.  Medications: Prior to Admission medications   Medication Sig Start Date End Date Taking? Authorizing Provider  colchicine 0.6 MG tablet Take 1 tablet (0.6 mg total) by mouth daily as needed. 01/11/15   Marijean Ailie Gage, NP  losartan (COZAAR) 25 MG tablet Take 25 mg by mouth daily.    Historical Provider, MD  omeprazole (PRILOSEC) 20 MG capsule Take 20 mg by mouth daily.    Historical Provider, MD  oxycodone (OXY-IR) 5 MG capsule Take 5 mg by mouth every 6 (six) hours as needed for pain.    Historical Provider, MD  predniSONE (DELTASONE) 5 MG tablet Take 5 mg by mouth daily as needed. For gout flare up 11/19/14   Historical Provider, MD  probenecid (BENEMID) 500 MG tablet Take 500 mg by mouth daily.     Historical Provider, MD  rivaroxaban (XARELTO) 20 MG TABS tablet 20mg  po daily starting 8/28 01/22/15   [name], MD  zolpidem (AMBIEN) 10 MG tablet Take 10 mg by mouth at bedtime as needed. 11/12/14   Historical Provider, MD     Family History  Problem Relation Age of Onset  . Cancer  Mother 66    colon cancer   . Cancer Paternal Grandmother     colon cancer  . Lung disease Neg Hx     Social History   Social History  . Marital Status: Married    Spouse Name: N/A  . Number of Children: N/A  . Years of Education: N/A   Social History Main Topics  . Smoking status: Former Smoker -- 1.50 packs/day for 24 years    Types: Cigarettes    Quit date: 06/06/1988  . Smokeless tobacco: Former Systems developer    Types: Snuff, Chew     Comment:  Quit chewing tobacco around 2000  . Alcohol Use: 10.8 oz/week    18 Cans of beer per week     Comment: 03/26/2014 "2-3, 12oz beers/day"  . Drug Use: No  . Sexual Activity: Yes   Other Topics Concern  . Not on file   Social History Narrative   Originally from [place]. 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 No pets currently. No bird exposure.     Review of Systems: A 12 point ROS discussed and pertinent positives are indicated in the HPI above.  All other systems are negative.  Review of Systems  Vital Signs: There were no vitals taken for this visit.  Physical Exam  Constitutional: He is oriented to person, place, and time. He appears well-developed and well-nourished. No distress.  HENT:  Head: Normocephalic and atraumatic.  Eyes: No scleral icterus.  Cardiovascular: Normal rate and regular rhythm.   Pulmonary/Chest: Effort normal.  Musculoskeletal: He exhibits no edema.  Neurological: He is alert and oriented to person, place, and time.  Skin: Skin is warm and dry.  Psychiatric: He has a normal mood and affect. His behavior is normal.  Nursing note and vitals reviewed.   Imaging: No results found.  Labs:  CBC:  Recent Labs  01/08/15 1229 01/08/15 1742 01/08/15 2310 01/09/15 0500  WBC 11.6* 8.3 9.3 9.0  HGB 14.3 13.5 13.4 13.2  HCT 43.0 39.4 39.7 39.9  PLT 188 156 162 167    COAGS:  Recent Labs  05/07/14 0203 01/05/15 1509  01/06/15 1811 01/07/15 0333 01/08/15 0228 01/08/15 1158  INR 1.10 1.19  --   --  1.35 1.20  --   APTT 123* 29  < > 60* 73* 54* 54*  < > = values in this interval not displayed.  BMP:  Recent Labs  01/06/15 0100 01/08/15 0228 01/09/15 0500 01/11/15 0548  NA 138 137 137 141  K 3.9 3.9 4.1 4.2  CL 104 101 101 104  CO2 26 29 28 31   GLUCOSE 97 114* 120* 109*  BUN 13 14 14 16   CALCIUM 8.7* 8.7* 8.4* 8.9  CREATININE 1.19 1.02 1.05 0.96  GFRNONAA >60 >60 >60 >60  GFRAA >60 >60 >60 >60      LIVER FUNCTION TESTS:  Recent Labs  05/06/14 1823 05/07/14 0203 01/05/15 1509  BILITOT 0.7 0.6 1.1  AST 70* 57* 35  ALT 90* 84* 41  ALKPHOS 52 61 42  PROT 6.5 6.7 6.5  ALBUMIN 3.7 3.8 3.4*    TUMOR MARKERS: No results for input(s): AFPTM, CEA, CA199, CHROMGRNA in the last 8760 hours.  Assessment and Plan:  Doing very well 10 weeks status post left lower extremity, lysis/thrombectomy, angioplasty of distal thigh femoral vein stenosis and stenting of May Thurner common iliac vein compression.  Patient's symptoms have almost completely resolved. He still has some mild  swelling of the foot, ankle and calf which occurs intermittently.   Ultrasound evaluation looks good today. No acute thrombus and only minimal residual chronic thrombus in one of the duplicated popliteal veins and calf veins.    1.) Continue Xarelto as directed by Dr. Burr Medico  2.) Agree with use of left thigh high compression hose.  3.) No need for further ultrasound surveillance at this time. If he develops any recurrent symptoms or issues, we are always here for him and we'll be happy to see him again in the future.   SignedJacqulynn Cadet 03/25/2015, 3:36 PM   I spent a total of  15 Minutes in face to face in clinical consultation, greater than 50% of which was counseling/coordinating care for left lower extremity DVT and May-Thurner.

## 2015-07-24 ENCOUNTER — Encounter: Payer: Self-pay | Admitting: Hematology

## 2015-07-24 ENCOUNTER — Ambulatory Visit (HOSPITAL_BASED_OUTPATIENT_CLINIC_OR_DEPARTMENT_OTHER): Payer: BLUE CROSS/BLUE SHIELD | Admitting: Hematology

## 2015-07-24 ENCOUNTER — Other Ambulatory Visit (HOSPITAL_BASED_OUTPATIENT_CLINIC_OR_DEPARTMENT_OTHER): Payer: BLUE CROSS/BLUE SHIELD

## 2015-07-24 ENCOUNTER — Ambulatory Visit: Payer: BLUE CROSS/BLUE SHIELD | Admitting: Hematology and Oncology

## 2015-07-24 ENCOUNTER — Telehealth: Payer: Self-pay | Admitting: Hematology

## 2015-07-24 VITALS — BP 147/95 | HR 73 | Temp 98.2°F | Resp 18 | Ht 70.0 in | Wt 216.7 lb

## 2015-07-24 DIAGNOSIS — I82402 Acute embolism and thrombosis of unspecified deep veins of left lower extremity: Secondary | ICD-10-CM

## 2015-07-24 DIAGNOSIS — R1013 Epigastric pain: Secondary | ICD-10-CM

## 2015-07-24 DIAGNOSIS — I82412 Acute embolism and thrombosis of left femoral vein: Secondary | ICD-10-CM | POA: Diagnosis not present

## 2015-07-24 DIAGNOSIS — I82432 Acute embolism and thrombosis of left popliteal vein: Secondary | ICD-10-CM | POA: Diagnosis not present

## 2015-07-24 DIAGNOSIS — I82442 Acute embolism and thrombosis of left tibial vein: Secondary | ICD-10-CM | POA: Diagnosis not present

## 2015-07-24 DIAGNOSIS — Z86711 Personal history of pulmonary embolism: Secondary | ICD-10-CM

## 2015-07-24 DIAGNOSIS — R740 Nonspecific elevation of levels of transaminase and lactic acid dehydrogenase [LDH]: Secondary | ICD-10-CM

## 2015-07-24 DIAGNOSIS — I2699 Other pulmonary embolism without acute cor pulmonale: Secondary | ICD-10-CM | POA: Diagnosis not present

## 2015-07-24 DIAGNOSIS — E7210 Disorders of sulfur-bearing amino-acid metabolism, unspecified: Secondary | ICD-10-CM

## 2015-07-24 DIAGNOSIS — D6862 Lupus anticoagulant syndrome: Secondary | ICD-10-CM

## 2015-07-24 LAB — CBC WITH DIFFERENTIAL/PLATELET
BASO%: 0.3 % (ref 0.0–2.0)
BASOS ABS: 0 10*3/uL (ref 0.0–0.1)
EOS ABS: 0.2 10*3/uL (ref 0.0–0.5)
EOS%: 4.5 % (ref 0.0–7.0)
HEMATOCRIT: 47.2 % (ref 38.4–49.9)
HEMOGLOBIN: 15.2 g/dL (ref 13.0–17.1)
LYMPH#: 1.2 10*3/uL (ref 0.9–3.3)
LYMPH%: 31.7 % (ref 14.0–49.0)
MCH: 26.9 pg — AB (ref 27.2–33.4)
MCHC: 32.2 g/dL (ref 32.0–36.0)
MCV: 83.5 fL (ref 79.3–98.0)
MONO#: 0.6 10*3/uL (ref 0.1–0.9)
MONO%: 15.2 % — AB (ref 0.0–14.0)
NEUT#: 1.9 10*3/uL (ref 1.5–6.5)
NEUT%: 48.3 % (ref 39.0–75.0)
PLATELETS: 201 10*3/uL (ref 140–400)
RBC: 5.65 10*6/uL (ref 4.20–5.82)
RDW: 16.8 % — AB (ref 11.0–14.6)
WBC: 3.8 10*3/uL — ABNORMAL LOW (ref 4.0–10.3)

## 2015-07-24 LAB — COMPREHENSIVE METABOLIC PANEL
ALK PHOS: 62 U/L (ref 40–150)
ALT: 56 U/L — ABNORMAL HIGH (ref 0–55)
ANION GAP: 10 meq/L (ref 3–11)
AST: 43 U/L — ABNORMAL HIGH (ref 5–34)
Albumin: 3.7 g/dL (ref 3.5–5.0)
BUN: 15 mg/dL (ref 7.0–26.0)
CALCIUM: 9.4 mg/dL (ref 8.4–10.4)
CO2: 27 mEq/L (ref 22–29)
Chloride: 105 mEq/L (ref 98–109)
Creatinine: 1.2 mg/dL (ref 0.7–1.3)
EGFR: 65 mL/min/{1.73_m2} — AB (ref >90)
Glucose: 79 mg/dl (ref 70–140)
POTASSIUM: 4.6 meq/L (ref 3.5–5.1)
Sodium: 142 mEq/L (ref 136–145)
Total Bilirubin: 0.85 mg/dL (ref 0.20–1.20)
Total Protein: 6.7 g/dL (ref 6.4–8.3)

## 2015-07-24 MED ORDER — RIVAROXABAN 20 MG PO TABS: ORAL_TABLET | ORAL | Status: DC

## 2015-07-24 NOTE — Telephone Encounter (Signed)
no pof in pt insisited a 6 month appt-sch and agave avs

## 2015-07-24 NOTE — Progress Notes (Signed)
Nicholas [name]  [phone] [phone]  Clinic Follow Up Note   Patient Care Team: No Pcp Per Patient as PCP - General (General Practice) 07/24/2015  CHIEF COMPLAINTS:  Recurrent PE/DVT   HISTORY OF PRESENTING ILLNESS:  Nicholas [name] 55 y.o. male is here because of recurrent PE and DVT.   He had lumbar lab in ectomy and decompression surgery in October 2015, he was able to move around without too much difficulty after surgery, but was off work and not very physically active. During his 1 months postop follow-up, he was found to be hypoxic in the office, and CT scan reviewed multiple bilateral pulmonary emboli. Ultrasound also showed right deep vein thrombosis involving the right popliteal, posterior tibia, and perineal vein. He was admitted to hospital for a few days and discharged home on Xarelto. He took it for 6 months and stopped it.  He developed worsening left leg swollen, pain, and dyspnea, 1 months after he stopped Xarelto. He was admitted to the hospital on 01/05/2015. He was found to have PE and extensive Lt leg DVT. He was started on heparin gtt. Given large clot burden in LLE he underwent IR guided mechanical thrombectomy and transcatheter thrombolytic therapy to LLE from 8/4-8/5. He tolerated this well, was tx out of ICU and started on rivaroxaban. He is ambulating without difficulty, on RA with stable vitals, no dyspnea or chest pain and much improved leg pain, and was discharged home on 01/11/2015.   He has felt much better since her hospital discharge. His left leg swollen anemia resolved, his dyspnea on exertion is much improved, he is back to walk normally. He denies any significant pain, no other symptoms.  He denies recent history of trauma, long distance travel, dehydration, recent surgery, smoking or prolonged immobilization.  He had no prior history or diagnosis of cancer. He has no family history of thrombosis. His mother and a paternal  grandmother had colon cancer. He had screening colonoscopy 6-7 years ago at the [place], was found to have couple of polyps. He is overdue 2 years for colonoscopy now. He never had PSA screening test before. He is to see his doctor at the [place], but does not want to go back. He does not have a primary care physician.  CURRENT THERAPY: Xarelto 20 mg once daily  INTERIM HISTORY: Nicholas [name] returns for follow up. He has been compliant with oral total, and tolerating well overall. He denies any episodes of bleeding. He reports mild epigastric discomfort, and loose bowel movements 2-3 times a day, for the past one months. No nausea, appetite change, or other complaints. He does not have a primary care physician, sees a doctor at the [place] for his chronic back pain.  MEDICAL HISTORY:  Past Medical History  Diagnosis Date  . Hypertension   . GERD (gastroesophageal reflux disease)   . Arthritis     "my whole body"  . Chronic back pain     "mainly lower; some in top too" (03/26/2014)  . History of gout   . Pulmonary embolism ([place])     Recurrent & unprovoked August 2016. Provoked/Post-op December 2015  . DVT (deep venous thrombosis) ([place])     SURGICAL HISTORY: Past Surgical History  Procedure Laterality Date  . Carpal tunnel release Bilateral 2012  . Lumbar laminectomy/decompression microdiscectomy  03/26/2014    L3-S1  . Inguinal hernia repair Right 2000's  . Back surgery    . Decompressive lumbar laminectomy level 3 N/A 03/26/2014  Procedure: L3-S1 DECOMPRESSION;  Surgeon: Melina Schools, MD;  Location: [facility];  Service: Orthopedics;  Laterality: N/A;  . Lytic therapy  August 2016    For Lower Extremity DVT    SOCIAL HISTORY: Social History   Social History  . Marital Status: Married    Spouse Name: N/A  . Number of Children: N/A  . Years of Education: N/A   Occupational History  . Not on file.   Social History Main Topics  . Smoking status: Former Smoker -- 1.50 packs/day for 24 years     Types: Cigarettes    Quit date: 06/06/1988  . Smokeless tobacco: Former Systems developer    Types: Snuff, Chew     Comment: Quit chewing tobacco around 2000  . Alcohol Use: 10.8 oz/week    18 Cans of beer per week     Comment: 03/26/2014 "2-3, 12oz beers/day"  . Drug Use: No  . Sexual Activity: Yes   Other Topics Concern  . Not on file   Social History Narrative   Originally from [place]. 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 No pets currently. No bird exposure.     FAMILY HISTORY: Family History  Problem Relation Age of Onset  . Cancer Mother 3    colon cancer   . Cancer Paternal Grandmother     colon cancer  . Lung disease Neg Hx     ALLERGIES:  has No Known Allergies.  MEDICATIONS:  Current Outpatient Prescriptions  Medication Sig Dispense Refill  . colchicine 0.6 MG tablet Take 1 tablet (0.6 mg total) by mouth daily as needed.    Marland Kitchen losartan (COZAAR) 25 MG tablet Take 25 mg by mouth daily.    Marland Kitchen omeprazole (PRILOSEC) 20 MG capsule Take 20 mg by mouth daily.    Marland Kitchen oxycodone (OXY-IR) 5 MG capsule Take 5 mg by mouth every 6 (six) hours as needed for pain.    . predniSONE (DELTASONE) 5 MG tablet Take 5 mg by mouth daily as needed. For gout flare up    . probenecid (BENEMID) 500 MG tablet Take 500 mg by mouth daily.     . rivaroxaban (XARELTO) 20 MG TABS tablet 20mg  po daily starting 8/28 30 tablet 5  . zolpidem (AMBIEN) 10 MG tablet Take 10 mg by mouth at bedtime as needed.     No current facility-administered medications for this visit.    REVIEW OF SYSTEMS:   Constitutional: Denies fevers, chills or abnormal night sweats Eyes: Denies blurriness of vision, double vision or watery eyes Ears, nose, mouth, throat, and face: Denies mucositis or sore throat Respiratory: Denies cough, dyspnea or wheezes Cardiovascular: Denies palpitation, chest discomfort or lower extremity swelling Gastrointestinal:  Denies nausea, heartburn or change in bowel  habits Skin: Denies abnormal skin rashes Lymphatics: Denies new lymphadenopathy or easy bruising Neurological:Denies numbness, tingling or new weaknesses Behavioral/Psych: Mood is stable, no new changes  All other systems were reviewed with the patient and are negative.  PHYSICAL EXAMINATION: ECOG PERFORMANCE STATUS: 0 - Asymptomatic  Filed Vitals:   07/24/15 1429  BP: 147/95  Pulse: 73  Temp: 98.2 F (36.8 C)  Resp: 18   Filed Weights   07/24/15 1429  Weight: 216 lb 11.2 oz (98.294 kg)    GENERAL:alert, no distress and comfortable SKIN: skin color, texture, turgor are normal, no rashes or significant lesions EYES: normal, conjunctiva are pink and non-injected, sclera clear OROPHARYNX:no exudate, no erythema and lips, buccal mucosa, and tongue  normal  NECK: supple, thyroid normal size, non-tender, without nodularity LYMPH:  no palpable lymphadenopathy in the cervical, axillary or inguinal LUNGS: clear to auscultation and percussion with normal breathing effort HEART: regular rate & rhythm and no murmurs and no lower extremity edema ABDOMEN:abdomen soft, non-tender and normal bowel sounds Musculoskeletal:no cyanosis of digits and no clubbing  PSYCH: alert & oriented x 3 with fluent speech NEURO: no focal motor/sensory deficits  LABORATORY DATA:  I have reviewed the data as listed  CBC Latest Ref Rng 07/24/2015 01/09/2015 01/08/2015  WBC 4.0 - 10.3 10e3/uL 3.8(L) 9.0 9.3  Hemoglobin 13.0 - 17.1 g/dL 15.2 13.2 13.4  Hematocrit 38.4 - 49.9 % 47.2 39.9 39.7  Platelets 140 - 400 10e3/uL 201 167 162    CMP Latest Ref Rng 07/24/2015 01/11/2015 01/09/2015  Glucose 70 - 140 mg/dl 79 109(H) 120(H)  BUN 7.0 - 26.0 mg/dL 15.0 16 14  Creatinine 0.7 - 1.3 mg/dL 1.2 0.96 1.05  Sodium 136 - 145 mEq/L 142 141 137  Potassium 3.5 - 5.1 mEq/L 4.6 4.2 4.1  Chloride 101 - 111 mmol/L - 104 101  CO2 22 - 29 mEq/L 27 31 28   Calcium 8.4 - 10.4 mg/dL 9.4 8.9 8.4(L)  Total Protein 6.4 - 8.3 g/dL 6.7  - -  Total Bilirubin 0.20 - 1.20 mg/dL 0.85 - -  Alkaline Phos 40 - 150 U/L 62 - -  AST 5 - 34 U/L 43(H) - -  ALT 0 - 55 U/L 56(H) - -     RADIOGRAPHIC STUDIES: I have personally reviewed the radiological images as listed and agreed with the findings in the report.  Ct Angio Chest Pe W/cm &/or Wo Cm 01/05/2015   IMPRESSION: 1. Bilateral pulmonary embolism but much more significant on the right side with a large central right-sided PE. Mild right ventricular heart strain with slight flattening of the left ventricle. The RV LV ratio is 0.92. 2. No significant pulmonary findings. 3. Normal thoracic aorta.  These results will be called to the ordering clinician or representative by the Radiologist Assistant, and communication documented in the PACS or zVision Dashboard.   Electronically Signed   By: Marijo Sanes M.D.   On: 01/05/2015 13:12   Ir Veno/ext/uni Left 01/09/2015    IMPRESSION: 1. Extensive occlusive deep venous thrombus through the left posterior tibial, popliteal and femoral veins. 2. After mechanical thrombectomy with diluted tPA, significant thrombus remain present. Thrombolytic infusion catheter was placed via posterior tibial access and infusion of tPA begun at 1 milligram/hour. This will be continued overnight. 3. Venography demonstrates chronic appearing stenosis and interruption of the central left common iliac vein with prominent collateral outflow into the contralateral right external iliac vein. This appears consistent with chronic stenosis from May Thurner syndrome and/or congenital interruption/duplication. This will be re-assessed after thrombolytic therapy to determine whether angioplasty and/or stent placement may be needed to improve iliac venous outflow.   Electronically Signed   By: Aletta Edouard M.D.   On: 01/09/2015 08:07    [facility] Angioplasty 01/12/2015    IMPRESSION: 1. Initial venography demonstrates minimal residual nonocclusive  thrombus below the knee and in the popliteal veins, duplicated popliteal veins, a high-grade stenosis in the femoral vein in the distal thigh, and evidence of May Thurner physiology with flow limiting narrowing of the central left common iliac vein. 2. Successful balloon angioplasty of femoral vein stenosis in the distal thigh to 8 mm. 3. Successful placement  of a 14 mm self expanding Nitinol stent across the May Thurner lesion. 4. Successful restoration of antegrade flow from the ankle to the IVC with resolution of collateral venous filling.  PLAN: 1. Continue anticoagulation for at least 6 months. 2. Follow-up in interventional radiology clinic in 2-4 weeks with left lower extremity duplex venous ultrasound.  Signed,  Criselda Peaches, MD  [facility]  [facility]   Electronically Signed   By: Jacqulynn Cadet M.D.   On: 01/12/2015 17:26    ASSESSMENT: 55 year old male without significant past medical history, presented with recurrent extensive DVT and PE. The first episode was probably revoked by spinal surgery, and a second episode was unprovoked. 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  PLAN:  -His previous hypercoagulopathy workup was negative for prothrombin gene mutation, factor V Leyden mutation, antithrombin III, he had normal protein C and protein S activities, but he had (+) lupus anticoagulant and slightly elevated homocystine level which may contributed to his thrombosis.  -I recommend lifelong anticoagulation if no contraindications -His tolerating Xeloda well, we'll continue. -I reviewed his lab results from today, his CBC and CMP are unremarkable except mild elevated liver enzymes  2. Epigastric discomfort, and mild transaminitis -Etiology is unclear -I recommend him to establish his routine care with primary care physician,  and follow-up his labs -I give him the contact information of the [facility] primary care at [facility] and Dr. Nancy Fetter at [facility] urgent care, and encouraged him to call for appointment.   Follow up -I'll see him back in 6 months.I refilled his Xarelto.  -I strongly encouraged him to have a primary care physician and I gave him some contact info about PCPs   All questions were answered. The patient knows to call the clinic with any problems, questions or concerns. I spent 15 minutes counseling the patient face to face. The total time spent in the appointment was 20 minutes and more than 50% was on counseling.     [name], MD 07/24/2015

## 2015-07-25 ENCOUNTER — Encounter: Payer: Self-pay | Admitting: Hematology

## 2015-10-28 ENCOUNTER — Telehealth: Payer: Self-pay | Admitting: Internal Medicine

## 2015-10-28 NOTE — Telephone Encounter (Signed)
Received colon report and placed on Dr. Blanch Media desk for review. Patient states that he is due for another colon and that he is xarelto.

## 2015-11-05 ENCOUNTER — Encounter: Payer: Self-pay | Admitting: Physician Assistant

## 2015-12-01 ENCOUNTER — Ambulatory Visit (INDEPENDENT_AMBULATORY_CARE_PROVIDER_SITE_OTHER): Payer: BLUE CROSS/BLUE SHIELD | Admitting: Physician Assistant

## 2015-12-01 ENCOUNTER — Telehealth: Payer: Self-pay | Admitting: *Deleted

## 2015-12-01 ENCOUNTER — Encounter: Payer: Self-pay | Admitting: Physician Assistant

## 2015-12-01 VITALS — BP 120/80 | HR 64 | Ht 70.0 in | Wt 212.8 lb

## 2015-12-01 DIAGNOSIS — Z800 Family history of malignant neoplasm of digestive organs: Secondary | ICD-10-CM | POA: Diagnosis not present

## 2015-12-01 DIAGNOSIS — Z86010 Personal history of colonic polyps: Secondary | ICD-10-CM | POA: Diagnosis not present

## 2015-12-01 DIAGNOSIS — Z7901 Long term (current) use of anticoagulants: Secondary | ICD-10-CM | POA: Diagnosis not present

## 2015-12-01 MED ORDER — NA SULFATE-K SULFATE-MG SULF 17.5-3.13-1.6 GM/177ML PO SOLN: 1.0000 | Freq: Once | ORAL | Status: DC

## 2015-12-01 NOTE — Progress Notes (Signed)
Patient ID: Nicholas [name], male   DOB: 03/21/1961, 55 y.o.   MRN: [id number]   Subjective:    Patient ID: Nicholas [name], male    DOB: [birth year], 55 y.o.   MRN: [id number]  HPI Nicholas [name] is a pleasant 55 year old white male new to GI today, self-referred who comes in to discuss a follow-up colonoscopy. Patient has history of colon polyps and family history of colon cancer in his mother who was diagnosed in her 20s. He has had prior colonoscopies done elsewhere. The last one was done in [place] in 2009. We have a copy of that report which was a normal exam. He was recommended to have 5 year follow-up. Patient also has history of GERD, hypertension and recurrent DVTs/pulmonary emboli. He is now on chronic Xarelto and is being followed by Dr. Burr Medico. Patient had his initial event in December 2015 and then a recurrent DVT/ PE in August 2016. He is feeling well currently and has no GI complaints. Specifically no abdominal pain nausea vomiting heartburn indigestion or dysphagia. He says his stools alternate sometimes from loose stools to formed stools  Occasionally sees some dark spots on his stool and occasionally lighter stools.  Review of Systems Pertinent positive and negative review of systems were noted in the above HPI section.  All other review of systems was otherwise negative.  Outpatient Encounter Prescriptions as of 12/01/2015  Medication Sig  . colchicine 0.6 MG tablet Take 1 tablet (0.6 mg total) by mouth daily as needed.  Marland Kitchen losartan (COZAAR) 25 MG tablet Take 25 mg by mouth daily.  Marland Kitchen omeprazole (PRILOSEC) 20 MG capsule Take 20 mg by mouth daily.  Marland Kitchen oxycodone (OXY-IR) 5 MG capsule Take 5 mg by mouth every 6 (six) hours as needed for pain.  . predniSONE (DELTASONE) 5 MG tablet Take 5 mg by mouth daily as needed. Reported on 07/24/2015  . probenecid (BENEMID) 500 MG tablet Take 500 mg by mouth daily.   . rivaroxaban (XARELTO) 20 MG TABS tablet 7m po daily starting 8/28  . zolpidem (AMBIEN) 10  MG tablet Take 10 mg by mouth at bedtime as needed.  . Na Sulfate-K Sulfate-Mg Sulf 17.5-3.13-1.6 GM/180ML SOLN Take 1 kit by mouth once.   No facility-administered encounter medications on file as of 12/01/2015.   Allergies  Allergen Reactions  . Penicillins Rash   Patient Active Problem List   Diagnosis Date Noted  . H/O: HTN (hypertension) 02/03/2015  . Gout 01/05/2015  . DVT, recurrent, lower extremity, acute ([place] 01/05/2015  . History of pulmonary embolus (PE) 01/05/2015  . Hx of decompressive lumbar laminectomy 05/06/2014  . GERD (gastroesophageal reflux disease) 05/06/2014  . Lumbar stenosis 03/26/2014   Social History   Social History  . Marital Status: Married    Spouse Name: N/A  . Number of Children: N/A  . Years of Education: N/A   Occupational History  . Automotive    Social History Main Topics  . Smoking status: Former Smoker -- 1.50 packs/day for 24 years    Types: Cigarettes    Quit date: 06/06/1988  . Smokeless tobacco: Former [employer]ystems developer   Types: Snuff, Chew     Comment: Quit chewing tobacco around 2000  . Alcohol Use: 10.8 oz/week    18 Cans of beer per week     Comment: 03/26/2014 "2-3, 12oz beers/day"  . Drug Use: No  . Sexual Activity: Yes   Other Topics Concern  . Not on file   Social History Narrative  Originally from [facility]. 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 No pets currently. No bird exposure.     Mr. Davoli family history includes Colon cancer in his mother and paternal grandmother. There is no history of Lung disease.      Objective:    Filed Vitals:   12/01/15 1054  BP: 120/80  Pulse: 64    Physical Exam  well-developed white male in no acute distress, pleasant blood pressure 120/80 pulse 64 height 5 foot 10 weight 212 BMI 30.5. HEENT; nontraumatic normocephalic EOMI PERRLA sclera anicteric, Cardiovascular; regular rate and rhythm with S1-S2 no murmur or gallop, Pulmonary; clear bilaterally,  Abdomen; soft nontender nondistended bowel sounds are active there is no palpable mass or hepatosplenomegaly on Rectal; exam not done, Extremities ;no clubbing cyanosis or edema skin warm and dry, Neuropsych; mood and affect appropriate     Assessment & Plan:   #69  55 year old white male overdue for follow-up colonoscopy. Patient with personal history of colon polyps (type not known), and normal colonoscopy 2009. He has family history of colon cancer in his mother diagnosed in her 62s. #2 chronic anticoagulation-on Xarelto #3 history of recurrent DVT/PE-last event August 2016  Plan; patient will be scheduled for colonoscopy with Dr. Loletha Carrow. Procedure discussed in detail with patient including risks and benefits, specifically potential risk of bleeding, perforation or infection. We also discussed very small but real risk of having a vascular event while off anticoagulation. Patient is agreeable to proceed  We will communicate with patient's hematologist Dr. Burr Medico to assure that holding Xarelto for 24 hours prior to colonoscopy with plans to resume immediately post-colonoscopy is reasonable for this patient.   Amy S Esterwood PA-C 12/01/2015   Cc: No ref. provider found

## 2015-12-01 NOTE — Patient Instructions (Signed)
You have been scheduled for a colonoscopy. Please follow written instructions given to you at your visit today.  Please pick up your prep supplies at the pharmacy within the next 1-3 days. [facility] [address] If you use inhalers (even only as needed), please bring them with you on the day of your procedure. Your physician has requested that you go to www.startemmi.com and enter the access code given to you at your visit today. This web site gives a general overview about your procedure. However, you should still follow specific instructions given to you by our office regarding your preparation for the procedure. 

## 2015-12-01 NOTE — Telephone Encounter (Signed)
He can stop Xarelto the day before colonoscopy and resume the day after procedure.  Myrtle, please let pt know not taking Xarelto on 7/18 and 7/19, and restart on 7/20 if no bleeding. Please also fax this note to [phone].  Thanks.  [name]  12/01/2015

## 2015-12-01 NOTE — Progress Notes (Signed)
Thank you for sending this case to me. I have reviewed the entire note, and the outlined plan seems appropriate.  

## 2015-12-01 NOTE — Telephone Encounter (Signed)
12/01/2015   RE: Nicholas [name] DOB: 22-Jan-1961 MRN: [id number]   Dear [name],    We have scheduled the above patient for an endoscopic procedure. Our records show that he is on anticoagulation therapy.   Please advise as to how long the patient may come off his therapy of Xarelto prior to the procedure, which is scheduled for 12-23-2015.  Please fax back/ or route the completed form to [place] at [phone].   Sincerely,    Amy Esterwood PA-C

## 2015-12-01 NOTE — Telephone Encounter (Signed)
  12/01/2015   RE: Nicholas [name] DOB: May 17, 1961 MRN: [id number]   Dear [name],    We have scheduled the above patient for an endoscopic procedure. Our records show that he is on anticoagulation therapy.   Please advise as to how long the patient may come off his therapy of Xarelto prior to the procedure, which is scheduled for 12-23-2015.  Please fax back/ or route the completed form to [place] at [phone].   Sincerely,    Amy Esterwood PA-C

## 2015-12-01 NOTE — Telephone Encounter (Signed)
Routed anti-coagulation letter to Dr. Burr Medico.

## 2015-12-02 NOTE — Telephone Encounter (Signed)
Advised patient's wife that he can hold Xarelto on 7-18 and 7-19. He can resume it on 7-20 if no bleeding per Dr. [name].

## 2015-12-15 ENCOUNTER — Encounter: Payer: Self-pay | Admitting: Gastroenterology

## 2015-12-23 ENCOUNTER — Ambulatory Visit (AMBULATORY_SURGERY_CENTER): Payer: BLUE CROSS/BLUE SHIELD | Admitting: Gastroenterology

## 2015-12-23 ENCOUNTER — Encounter: Payer: Self-pay | Admitting: Gastroenterology

## 2015-12-23 VITALS — BP 147/78 | HR 58 | Temp 98.4°F | Resp 16 | Ht 70.0 in | Wt 212.0 lb

## 2015-12-23 DIAGNOSIS — D123 Benign neoplasm of transverse colon: Secondary | ICD-10-CM | POA: Diagnosis not present

## 2015-12-23 DIAGNOSIS — D125 Benign neoplasm of sigmoid colon: Secondary | ICD-10-CM

## 2015-12-23 DIAGNOSIS — D122 Benign neoplasm of ascending colon: Secondary | ICD-10-CM | POA: Diagnosis not present

## 2015-12-23 DIAGNOSIS — Z800 Family history of malignant neoplasm of digestive organs: Secondary | ICD-10-CM | POA: Diagnosis present

## 2015-12-23 DIAGNOSIS — Z86010 Personal history of colonic polyps: Secondary | ICD-10-CM

## 2015-12-23 MED ORDER — SODIUM CHLORIDE 0.9 % IV SOLN: 500.0000 mL | INTRAVENOUS | Status: DC

## 2015-12-23 NOTE — Progress Notes (Signed)
To recovery, report to McCoy, RN, VSS 

## 2015-12-23 NOTE — Op Note (Signed)
[facility] Patient Name: Nicholas [name] Procedure Date: 12/23/2015 9:11 AM MRN: [id number] Endoscopist: Mallie Mussel L. Loletha Carrow , MD Age: 55 Referring MD:  Date of Birth: Sep 17, 1960 Gender: Male Account #: [id number] Procedure:                Colonoscopy Indications:              High risk colon cancer surveillance: Personal                            history of colonic polyps, Family history of colon                            cancer in a first-degree relative (mother, age <108) Medicines:                Monitored Anesthesia Care Procedure:                Pre-Anesthesia Assessment:                           - Prior to the procedure, a History and Physical                            was performed, and patient medications and                            allergies were reviewed. The patient's tolerance of                            previous anesthesia was also reviewed. The risks                            and benefits of the procedure and the sedation                            options and risks were discussed with the patient.                            All questions were answered, and informed consent                            was obtained. Prior Anticoagulants: The patient has                            taken Xarelto (rivaroxaban), last dose was 2 days                            prior to procedure. ASA Grade Assessment: III - A                            patient with severe systemic disease. After                            reviewing the risks and benefits, the patient was  deemed in satisfactory condition to undergo the                            procedure.                           After obtaining informed consent, the colonoscope                            was passed under direct vision. Throughout the                            procedure, the patient's blood pressure, pulse, and                            oxygen saturations were monitored continuously. The                             Model CF-HQ190L [id number]) scope was introduced                            through the anus and advanced to the the cecum,                            identified by appendiceal orifice and ileocecal                            valve. The colonoscopy was performed without                            difficulty. The patient tolerated the procedure                            well. The quality of the bowel preparation was                            excellent. The ileocecal valve, appendiceal                            orifice, and rectum were photographed. Scope In: 9:16:10 AM Scope Out: 9:37:14 AM Scope Withdrawal Time: 0 hours 17 minutes 43 seconds  Total Procedure Duration: 0 hours 21 minutes 4 seconds  Findings:                 The perianal and digital rectal examinations were                            normal.                           Five sessile polyps were found in the transverse                            colon and ascending colon. The polyps were 2 to 8  mm in size. These polyps were removed with a cold                            snare. Resection was complete, but one of the 72mm                            polyps was not retrieved.                           A 2 mm polyp was found in the sigmoid colon. The                            polyp was sessile. The polyp was removed with a                            cold biopsy forceps. Resection and retrieval were                            complete.                           The exam was otherwise without abnormality on                            direct and retroflexion views. Complications:            No immediate complications. Estimated Blood Loss:     Estimated blood loss was minimal. Impression:               - Five 2 to 8 mm polyps in the transverse colon and                            in the ascending colon, removed with a cold snare.                            Complete resection.  Partial retrieval.                           - One 2 mm polyp in the sigmoid colon, removed with                            a cold biopsy forceps. Resected and retrieved.                           - The examination was otherwise normal on direct                            and retroflexion views. Recommendation:           - Patient has a contact number available for                            emergencies. The signs and symptoms of potential  delayed complications were discussed with the                            patient. Return to normal activities tomorrow.                            Written discharge instructions were provided to the                            patient.                           - Resume previous diet.                           - Resume Xarelto (rivaroxaban) at prior dose in 5                            days.                           - Await pathology results.                           - Repeat colonoscopy is recommended for                            surveillance. The colonoscopy date will be                            determined after pathology results from today's                            exam become available for review. Joelene Barriere L. Loletha Carrow, MD 12/23/2015 9:49:56 AM This report has been signed electronically.

## 2015-12-23 NOTE — Patient Instructions (Signed)
Discharge instructions given. Handout on polyps. Resume Xarelto at prior dose in 5 days. Resume other medications today. YOU HAD AN ENDOSCOPIC PROCEDURE TODAY AT [facility]:   Refer to the procedure report that was given to you for any specific questions about what was found during the examination.  If the procedure report does not answer your questions, please call your gastroenterologist to clarify.  If you requested that your care partner not be given the details of your procedure findings, then the procedure report has been included in a sealed envelope for you to review at your convenience later.  YOU SHOULD EXPECT: Some feelings of bloating in the abdomen. Passage of more gas than usual.  Walking can help get rid of the air that was put into your GI tract during the procedure and reduce the bloating. If you had a lower endoscopy (such as a colonoscopy or flexible sigmoidoscopy) you may notice spotting of blood in your stool or on the toilet paper. If you underwent a bowel prep for your procedure, you may not have a normal bowel movement for a few days.  Please Note:  You might notice some irritation and congestion in your nose or some drainage.  This is from the oxygen used during your procedure.  There is no need for concern and it should clear up in a day or so.  SYMPTOMS TO REPORT IMMEDIATELY:   Following lower endoscopy (colonoscopy or flexible sigmoidoscopy):  Excessive amounts of blood in the stool  Significant tenderness or worsening of abdominal pains  Swelling of the abdomen that is new, acute  Fever of 100F or higher   For urgent or emergent issues, a gastroenterologist can be reached at any hour by calling [phone].   DIET: Your first meal following the procedure should be a small meal and then it is ok to progress to your normal diet. Heavy or fried foods are harder to digest and may make you feel nauseous or bloated.  Likewise, meals heavy in dairy  and vegetables can increase bloating.  Drink plenty of fluids but you should avoid alcoholic beverages for 24 hours.  ACTIVITY:  You should plan to take it easy for the rest of today and you should NOT DRIVE or use heavy machinery until tomorrow (because of the sedation medicines used during the test).    FOLLOW UP: Our staff will call the number listed on your records the next business day following your procedure to check on you and address any questions or concerns that you may have regarding the information given to you following your procedure. If we do not reach you, we will leave a message.  However, if you are feeling well and you are not experiencing any problems, there is no need to return our call.  We will assume that you have returned to your regular daily activities without incident.  If any biopsies were taken you will be contacted by phone or by letter within the next 1-3 weeks.  Please call us at [phone] if you have not heard about the biopsies in 3 weeks.    SIGNATURES/CONFIDENTIALITY: You and/or your care partner have signed paperwork which will be entered into your electronic medical record.  These signatures attest to the fact that that the information above on your After Visit Summary has been reviewed and is understood.  Full responsibility of the confidentiality of this discharge information lies with you and/or your care-partner.

## 2015-12-23 NOTE — Progress Notes (Signed)
Called to room to assist during endoscopic procedure.  Patient ID and intended procedure confirmed with present staff. Received instructions for my participation in the procedure from the performing physician.  

## 2015-12-24 ENCOUNTER — Telehealth: Payer: Self-pay

## 2015-12-24 NOTE — Telephone Encounter (Signed)
  Follow up Call-  Call back number 12/23/2015  Post procedure Call Back phone  # [phone] home or cell [phone]  Permission to leave phone message Yes     Patient questions:  Do you have a fever, pain , or abdominal swelling? No. Pain Score  0 *  Have you tolerated food without any problems? Yes.    Have you been able to return to your normal activities? Yes.    Do you have any questions about your discharge instructions: Diet   No. Medications  No. Follow up visit  No.  Do you have questions or concerns about your Care? No.  Actions: * If pain score is 4 or above: No action needed, pain <4.

## 2016-01-05 ENCOUNTER — Encounter: Payer: Self-pay | Admitting: Gastroenterology

## 2016-01-21 ENCOUNTER — Telehealth: Payer: Self-pay | Admitting: Hematology

## 2016-01-21 ENCOUNTER — Ambulatory Visit (HOSPITAL_BASED_OUTPATIENT_CLINIC_OR_DEPARTMENT_OTHER): Payer: BLUE CROSS/BLUE SHIELD | Admitting: Hematology

## 2016-01-21 ENCOUNTER — Other Ambulatory Visit (HOSPITAL_BASED_OUTPATIENT_CLINIC_OR_DEPARTMENT_OTHER): Payer: BLUE CROSS/BLUE SHIELD

## 2016-01-21 DIAGNOSIS — R799 Abnormal finding of blood chemistry, unspecified: Secondary | ICD-10-CM

## 2016-01-21 DIAGNOSIS — I82431 Acute embolism and thrombosis of right popliteal vein: Secondary | ICD-10-CM | POA: Diagnosis not present

## 2016-01-21 DIAGNOSIS — I2699 Other pulmonary embolism without acute cor pulmonale: Secondary | ICD-10-CM | POA: Diagnosis not present

## 2016-01-21 DIAGNOSIS — I82402 Acute embolism and thrombosis of unspecified deep veins of left lower extremity: Secondary | ICD-10-CM

## 2016-01-21 DIAGNOSIS — I82432 Acute embolism and thrombosis of left popliteal vein: Secondary | ICD-10-CM | POA: Diagnosis not present

## 2016-01-21 DIAGNOSIS — I82412 Acute embolism and thrombosis of left femoral vein: Secondary | ICD-10-CM | POA: Diagnosis not present

## 2016-01-21 DIAGNOSIS — I82491 Acute embolism and thrombosis of other specified deep vein of right lower extremity: Secondary | ICD-10-CM

## 2016-01-21 DIAGNOSIS — Z86711 Personal history of pulmonary embolism: Secondary | ICD-10-CM

## 2016-01-21 DIAGNOSIS — I82441 Acute embolism and thrombosis of right tibial vein: Secondary | ICD-10-CM | POA: Diagnosis not present

## 2016-01-21 DIAGNOSIS — I82442 Acute embolism and thrombosis of left tibial vein: Secondary | ICD-10-CM

## 2016-01-21 DIAGNOSIS — D6862 Lupus anticoagulant syndrome: Secondary | ICD-10-CM

## 2016-01-21 LAB — CBC WITH DIFFERENTIAL/PLATELET
BASO%: 0.4 % (ref 0.0–2.0)
BASOS ABS: 0 10*3/uL (ref 0.0–0.1)
EOS ABS: 0.3 10*3/uL (ref 0.0–0.5)
EOS%: 6.2 % (ref 0.0–7.0)
HCT: 49.9 % (ref 38.4–49.9)
HEMOGLOBIN: 15.8 g/dL (ref 13.0–17.1)
LYMPH%: 26.6 % (ref 14.0–49.0)
MCH: 27.1 pg — ABNORMAL LOW (ref 27.2–33.4)
MCHC: 31.7 g/dL — ABNORMAL LOW (ref 32.0–36.0)
MCV: 85.7 fL (ref 79.3–98.0)
MONO#: 0.6 10*3/uL (ref 0.1–0.9)
MONO%: 13.3 % (ref 0.0–14.0)
NEUT%: 53.5 % (ref 39.0–75.0)
NEUTROS ABS: 2.5 10*3/uL (ref 1.5–6.5)
PLATELETS: 180 10*3/uL (ref 140–400)
RBC: 5.82 10*6/uL (ref 4.20–5.82)
RDW: 17.2 % — AB (ref 11.0–14.6)
WBC: 4.7 10*3/uL (ref 4.0–10.3)
lymph#: 1.2 10*3/uL (ref 0.9–3.3)

## 2016-01-21 LAB — COMPREHENSIVE METABOLIC PANEL
ALBUMIN: 3.7 g/dL (ref 3.5–5.0)
ALK PHOS: 61 U/L (ref 40–150)
ALT: 74 U/L — ABNORMAL HIGH (ref 0–55)
ANION GAP: 8 meq/L (ref 3–11)
AST: 67 U/L — ABNORMAL HIGH (ref 5–34)
BILIRUBIN TOTAL: 1.19 mg/dL (ref 0.20–1.20)
BUN: 15.4 mg/dL (ref 7.0–26.0)
CALCIUM: 9.6 mg/dL (ref 8.4–10.4)
CO2: 29 mEq/L (ref 22–29)
Chloride: 104 mEq/L (ref 98–109)
Creatinine: 1.2 mg/dL (ref 0.7–1.3)
EGFR: 67 mL/min/{1.73_m2} — AB (ref >90)
Glucose: 90 mg/dl (ref 70–140)
Potassium: 4.6 mEq/L (ref 3.5–5.1)
Sodium: 141 mEq/L (ref 136–145)
TOTAL PROTEIN: 6.9 g/dL (ref 6.4–8.3)

## 2016-01-21 MED ORDER — RIVAROXABAN 20 MG PO TABS: ORAL_TABLET | ORAL | 5 refills | Status: DC

## 2016-01-21 NOTE — Progress Notes (Signed)
[facility]  Telephone:(336) [phone] Fax:(336) [phone]  Clinic Follow Up Note   Patient Care Team: No Pcp Per Patient as PCP - General (General Practice) 01/21/2016  CHIEF COMPLAINTS:  Recurrent PE/DVT   HISTORY OF PRESENTING ILLNESS:  Nicholas [name] 55 y.o. male is here because of recurrent PE and DVT.   He had lumbar lab in ectomy and decompression surgery in October 2015, he was able to move around without too much difficulty after surgery, but was off work and not very physically active. During his 1 months postop follow-up, he was found to be hypoxic in the office, and CT scan reviewed multiple bilateral pulmonary emboli. Ultrasound also showed right deep vein thrombosis involving the right popliteal, posterior tibia, and perineal vein. He was admitted to hospital for a few days and discharged home on Xarelto. He took it for 6 months and stopped it.  He developed worsening left leg swollen, pain, and dyspnea, 1 months after he stopped Xarelto. He was admitted to the hospital on 01/05/2015. He was found to have PE and extensive Lt leg DVT. He was started on heparin gtt. Given large clot burden in LLE he underwent IR guided mechanical thrombectomy and transcatheter thrombolytic therapy to LLE from 8/4-8/5. He tolerated this well, was tx out of ICU and started on rivaroxaban. He is ambulating without difficulty, on RA with stable vitals, no dyspnea or chest pain and much improved leg pain, and was discharged home on 01/11/2015.   He has felt much better since her hospital discharge. His left leg swollen anemia resolved, his dyspnea on exertion is much improved, he is back to walk normally. He denies any significant pain, no other symptoms.  He denies recent history of trauma, long distance travel, dehydration, recent surgery, smoking or prolonged immobilization.  He had no prior history or diagnosis of cancer. He has no family history of thrombosis. His mother and a paternal  grandmother had colon cancer. He had screening colonoscopy 6-7 years ago at the [place], was found to have couple of polyps. He is overdue 2 years for colonoscopy now. He never had PSA screening test before. He is to see his doctor at the [place], but does not want to go back. He does not have a primary care physician.  CURRENT THERAPY: Xarelto 20 mg once daily  INTERIM HISTORY: Mr. Nicholas [name] returns for follow up. He is doing well. He is compliant with Xarelto, tolerating well, no episodes of bleeding OR side effects. He denies any pain, dyspnea, leg swelling or other symptoms. He has good appetite and energy level.  MEDICAL HISTORY:  Past Medical History:  Diagnosis Date  . Arthritis    "my whole body"  . Chronic back pain    "mainly lower; some in top too" (03/26/2014)  . DVT (deep venous thrombosis) ([place])   . GERD (gastroesophageal reflux disease)   . History of gout   . Hypertension   . Pulmonary embolism ([place])    Recurrent & unprovoked August 2016. Provoked/Post-op December 2015    SURGICAL HISTORY: Past Surgical History:  Procedure Laterality Date  . BACK SURGERY    . CARPAL TUNNEL RELEASE Bilateral 2012  . DECOMPRESSIVE LUMBAR LAMINECTOMY LEVEL 3 N/A 03/26/2014   Procedure: L3-S1 DECOMPRESSION;  Surgeon: Melina Schools, MD;  Location: [facility];  Service: Orthopedics;  Laterality: N/A;  . INGUINAL HERNIA REPAIR Right 2000's  . LUMBAR LAMINECTOMY/DECOMPRESSION MICRODISCECTOMY  03/26/2014   L3-S1  . lytic therapy  August 2016   For  Lower Extremity DVT    SOCIAL HISTORY: Social History   Social History  . Marital status: Married    Spouse name: N/A  . Number of children: N/A  . Years of education: N/A   Occupational History  . Automotive    Social History Main Topics  . Smoking status: Former Smoker    Packs/day: 1.50    Years: 24.00    Types: Cigarettes    Quit date: 06/06/1988  . Smokeless tobacco: Former Systems developer    Types: Snuff, Chew     Comment: Quit chewing tobacco around  2000  . Alcohol use 10.8 oz/week    18 Cans of beer per week     Comment: 03/26/2014 "2-3, 12oz beers/day"  . Drug use: No  . Sexual activity: Yes   Other Topics Concern  . Not on file   Social History Narrative   Originally from [place]. 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 No pets currently. No bird exposure.     FAMILY HISTORY: Family History  Problem Relation Age of Onset  . Colon cancer Mother   . Colon cancer Paternal Grandmother     colon cancer  . Lung disease Neg Hx     ALLERGIES:  is allergic to penicillins.  MEDICATIONS:  Current Outpatient Prescriptions  Medication Sig Dispense Refill  . colchicine 0.6 MG tablet Take 1 tablet (0.6 mg total) by mouth daily as needed.    Marland Kitchen losartan (COZAAR) 25 MG tablet Take 25 mg by mouth daily.    Marland Kitchen omeprazole (PRILOSEC) 20 MG capsule Take 20 mg by mouth daily.    Marland Kitchen oxycodone (OXY-IR) 5 MG capsule Take 5 mg by mouth every 6 (six) hours as needed for pain.    . predniSONE (DELTASONE) 5 MG tablet Take 5 mg by mouth daily as needed. Reported on 12/23/2015    . probenecid (BENEMID) 500 MG tablet Take 500 mg by mouth daily.     . rivaroxaban (XARELTO) 20 MG TABS tablet 20mg  po daily starting 8/28 30 tablet 5  . zolpidem (AMBIEN) 10 MG tablet Take 10 mg by mouth at bedtime as needed.     No current facility-administered medications for this visit.     REVIEW OF SYSTEMS:   Constitutional: Denies fevers, chills or abnormal night sweats Eyes: Denies blurriness of vision, double vision or watery eyes Ears, nose, mouth, throat, and face: Denies mucositis or sore throat Respiratory: Denies cough, dyspnea or wheezes Cardiovascular: Denies palpitation, chest discomfort or lower extremity swelling Gastrointestinal:  Denies nausea, heartburn or change in bowel habits Skin: Denies abnormal skin rashes Lymphatics: Denies new lymphadenopathy or easy bruising Neurological:Denies numbness, tingling or new  weaknesses Behavioral/Psych: Mood is stable, no new changes  All other systems were reviewed with the patient and are negative.  PHYSICAL EXAMINATION: ECOG PERFORMANCE STATUS: 0 - Asymptomatic  Vitals:   01/21/16 1447  BP: (!) 136/92  Pulse: 63  Resp: 18  Temp: 98.1 F (36.7 C)   Filed Weights   01/21/16 1447  Weight: 212 lb 11.2 oz (96.5 kg)    GENERAL:alert, no distress and comfortable SKIN: skin color, texture, turgor are normal, no rashes or significant lesions EYES: normal, conjunctiva are pink and non-injected, sclera clear OROPHARYNX:no exudate, no erythema and lips, buccal mucosa, and tongue normal  NECK: supple, thyroid normal size, non-tender, without nodularity LYMPH:  no palpable lymphadenopathy in the cervical, axillary or inguinal LUNGS: clear to auscultation and percussion with normal breathing  effort HEART: regular rate & rhythm and no murmurs and no lower extremity edema ABDOMEN:abdomen soft, non-tender and normal bowel sounds Musculoskeletal:no cyanosis of digits and no clubbing  PSYCH: alert & oriented x 3 with fluent speech NEURO: no focal motor/sensory deficits  LABORATORY DATA:  I have reviewed the data as listed  CBC Latest Ref Rng & Units 01/21/2016 07/24/2015 01/09/2015  WBC 4.0 - 10.3 10e3/uL 4.7 3.8(L) 9.0  Hemoglobin 13.0 - 17.1 g/dL 15.8 15.2 13.2  Hematocrit 38.4 - 49.9 % 49.9 47.2 39.9  Platelets 140 - 400 10e3/uL 180 201 167    CMP Latest Ref Rng & Units 01/21/2016 07/24/2015 01/11/2015  Glucose 70 - 140 mg/dl 90 79 109(H)  BUN 7.0 - 26.0 mg/dL 15.4 15.0 16  Creatinine 0.7 - 1.3 mg/dL 1.2 1.2 0.96  Sodium 136 - 145 mEq/L 141 142 141  Potassium 3.5 - 5.1 mEq/L 4.6 4.6 4.2  Chloride 101 - 111 mmol/L - - 104  CO2 22 - 29 mEq/L 29 27 31   Calcium 8.4 - 10.4 mg/dL 9.6 9.4 8.9  Total Protein 6.4 - 8.3 g/dL 6.9 6.7 -  Total Bilirubin 0.20 - 1.20 mg/dL 1.19 0.85 -  Alkaline Phos 40 - 150 U/L 61 62 -  AST 5 - 34 U/L 67(H) 43(H) -  ALT 0 - 55 U/L  74(H) 56(H) -     RADIOGRAPHIC STUDIES: I have personally reviewed the radiological images as listed and agreed with the findings in the report.  Ct Angio Chest Pe W/cm &/or Wo Cm 01/05/2015   IMPRESSION: 1. Bilateral pulmonary embolism but much more significant on the right side with a large central right-sided PE. Mild right ventricular heart strain with slight flattening of the left ventricle. The RV LV ratio is 0.92. 2. No significant pulmonary findings. 3. Normal thoracic aorta.  These results will be called to the ordering clinician or representative by the Radiologist Assistant, and communication documented in the PACS or zVision Dashboard.   Electronically Signed   By: Marijo Sanes M.D.   On: 01/05/2015 13:12   Ir Veno/ext/uni Left 01/09/2015    IMPRESSION: 1. Extensive occlusive deep venous thrombus through the left posterior tibial, popliteal and femoral veins. 2. After mechanical thrombectomy with diluted tPA, significant thrombus remain present. Thrombolytic infusion catheter was placed via posterior tibial access and infusion of tPA begun at 1 milligram/hour. This will be continued overnight. 3. Venography demonstrates chronic appearing stenosis and interruption of the central left common iliac vein with prominent collateral outflow into the contralateral right external iliac vein. This appears consistent with chronic stenosis from May Thurner syndrome and/or congenital interruption/duplication. This will be re-assessed after thrombolytic therapy to determine whether angioplasty and/or stent placement may be needed to improve iliac venous outflow.   Electronically Signed   By: Aletta Edouard M.D.   On: 01/09/2015 08:07    [place] Angioplasty 01/12/2015    IMPRESSION: 1. Initial venography demonstrates minimal residual nonocclusive thrombus below the knee and in the popliteal veins, duplicated popliteal veins, a high-grade stenosis in the femoral vein in the  distal thigh, and evidence of May Thurner physiology with flow limiting narrowing of the central left common iliac vein. 2. Successful balloon angioplasty of femoral vein stenosis in the distal thigh to 8 mm. 3. Successful placement of a 14 mm self expanding Nitinol stent across the May Thurner lesion. 4. Successful restoration of antegrade flow from the ankle to the IVC with  resolution of collateral venous filling.  PLAN: 1. Continue anticoagulation for at least 6 months. 2. Follow-up in interventional radiology clinic in 2-4 weeks with left lower extremity duplex venous ultrasound.  Signed,  Criselda Peaches, MD  [facility]  [facility]   Electronically Signed   By: Jacqulynn Cadet M.D.   On: 01/12/2015 17:26    ASSESSMENT: 55 year old male without significant past medical history, presented with recurrent extensive DVT and PE. The first episode was probably revoked by spinal surgery, and a second episode was unprovoked. 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  1. Recurrent DVT and PE -His previous hypercoagulopathy workup was negative for prothrombin gene mutation, factor V Leyden mutation, antithrombin III, he had normal protein C and protein S activities, but he had (+) lupus anticoagulant and slightly elevated homocystine level which may contributed to his thrombosis. -I'll repeat his lupus anticoagulant next time, to see if he has antiphospholipid syndrome. -I recommend lifelong anticoagulation if no contraindications -His tolerating Xeloda well, we'll continue. -I reviewed his lab results from today, his CBC and CMP are unremarkable except mild elevated liver enzymes   Follow up -I'll see him back in 6 months.I refilled his Xarelto.  -I strongly encouraged him to have a primary care physician and he agrees to call for appointment     All questions were answered. The patient knows to call the clinic with any problems, questions or concerns. I spent 15 minutes counseling the patient face to face. The total time spent in the appointment was 20 minutes and more than 50% was on counseling.     [name], MD 01/21/2016

## 2016-01-21 NOTE — Telephone Encounter (Signed)
Gave patient avs report and appointments for February.  °

## 2016-01-25 ENCOUNTER — Encounter: Payer: Self-pay | Admitting: Hematology

## 2016-02-17 NOTE — Telephone Encounter (Signed)
Appointments have already been completed.

## 2016-04-19 ENCOUNTER — Telehealth: Payer: Self-pay | Admitting: *Deleted

## 2016-04-19 NOTE — Telephone Encounter (Signed)
Received call from pt's wife stating that pt has reached limits on his insurance coverage for his xarelto & cost is >$400/mo.  Talked with Pharmacist & then left message for wife to [facility] should be able to get drug with coupon for cheap.  Informed to call pharmacist if she needs help with this. @ [phone].

## 2016-07-09 ENCOUNTER — Telehealth: Payer: Self-pay | Admitting: Hematology

## 2016-07-09 NOTE — Telephone Encounter (Signed)
Lvm advising appt chgd from 2/15 to 3/15 @ 1.30pm.

## 2016-07-21 ENCOUNTER — Ambulatory Visit: Payer: BLUE CROSS/BLUE SHIELD | Admitting: Hematology

## 2016-07-21 ENCOUNTER — Other Ambulatory Visit: Payer: BLUE CROSS/BLUE SHIELD

## 2016-08-04 IMAGING — CT CT HEAD W/O CM
1 series · 16 of 30 positions shown, 20 images · non-contrast
Comparison: None.

CLINICAL DATA: Headache and temporal area of bilaterally 4 days.
Blood clot in left lower extremity with swelling and pain.

EXAM:
CT HEAD WITHOUT CONTRAST
TECHNIQUE: Contiguous axial images were obtained from the base of the skull
through the vertex without intravenous contrast.

[Series 2: head 5.0 h30s · axial · 0.49mm/px · z∈[-132,+8]mm · 16 of 32 slices shown, 20 images]
[im 2/32  brain]
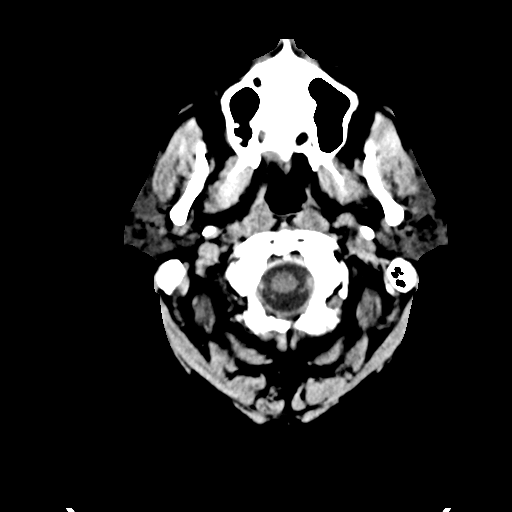
[im 2/32  bone]
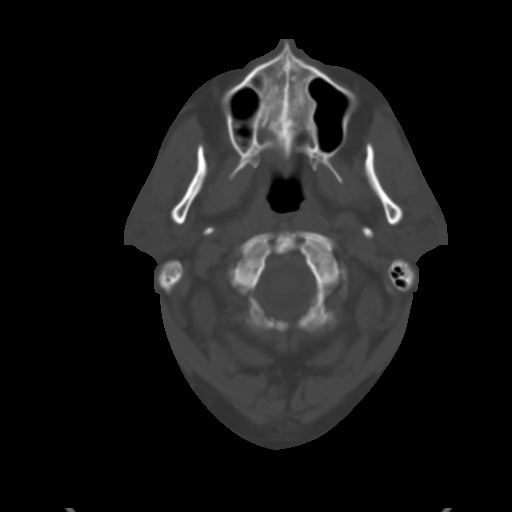
[im 4/32  brain]
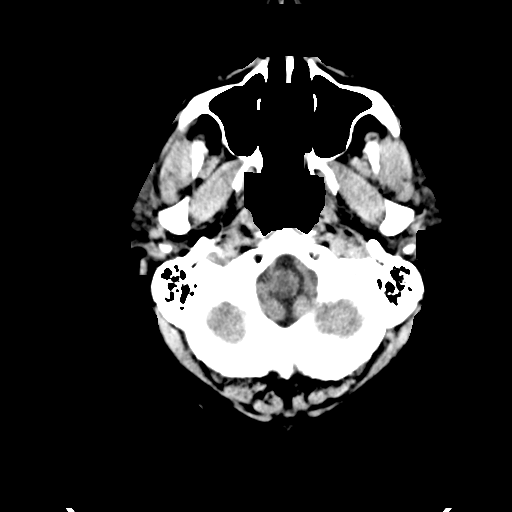
[im 6/32  brain]
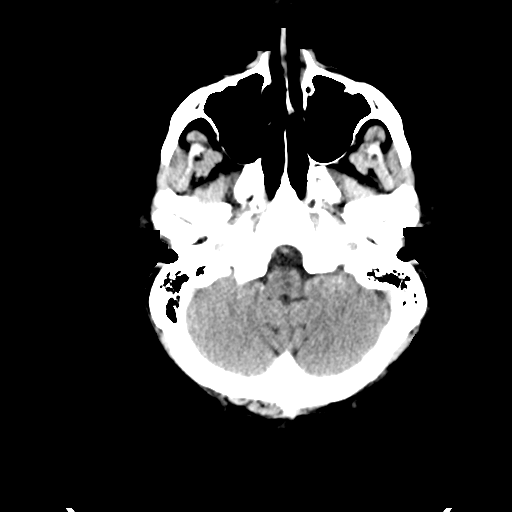
[im 8/32  brain]
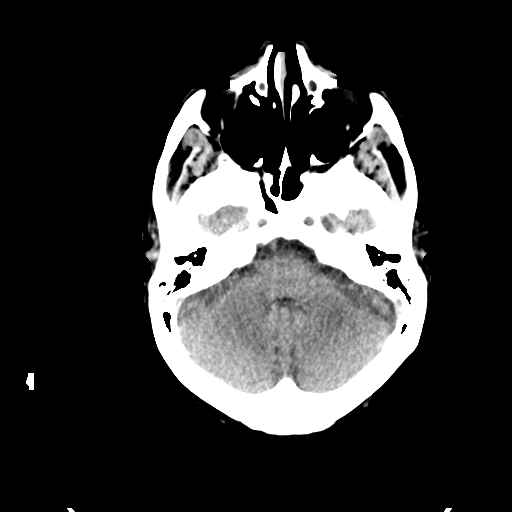
[im 9/32  brain]
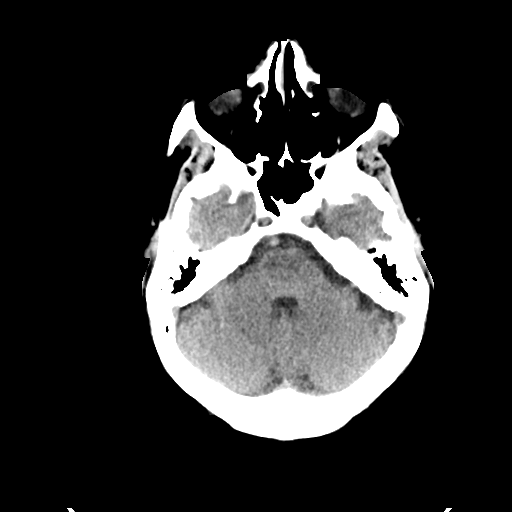
[im 9/32  bone]
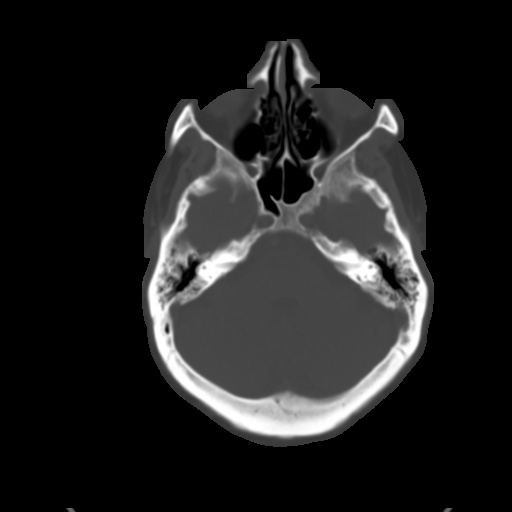
[im 11/32  brain]
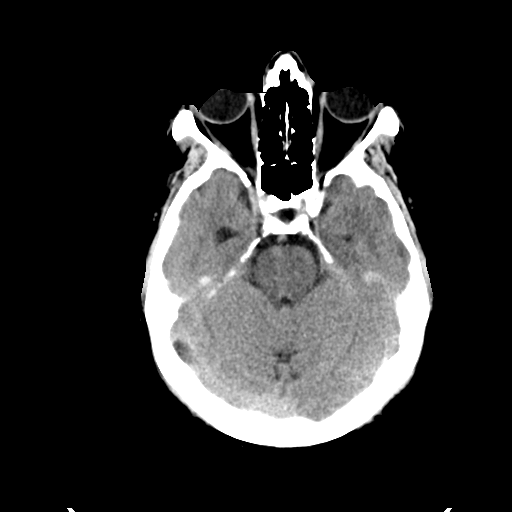
[im 13/32  brain]
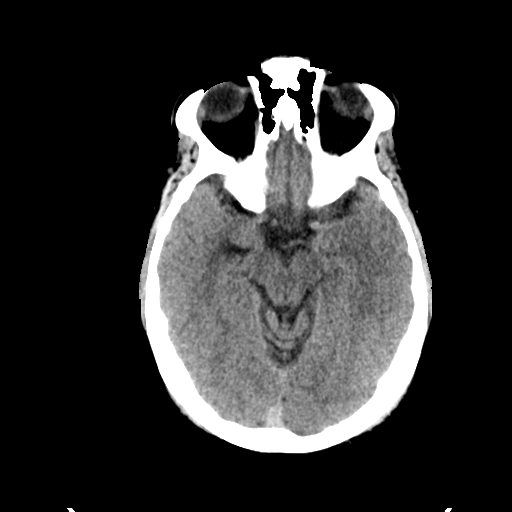
[im 15/32  brain]
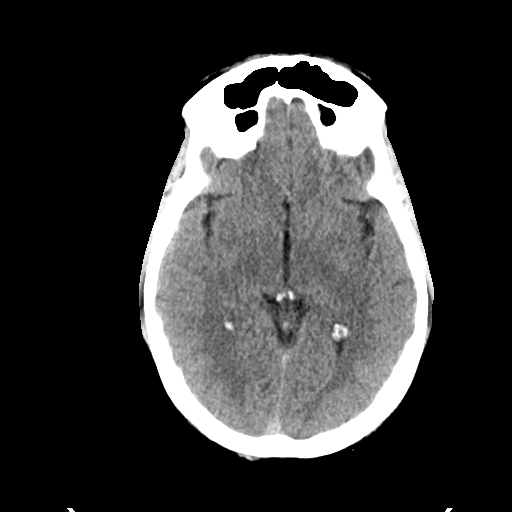
[im 17/32  brain]
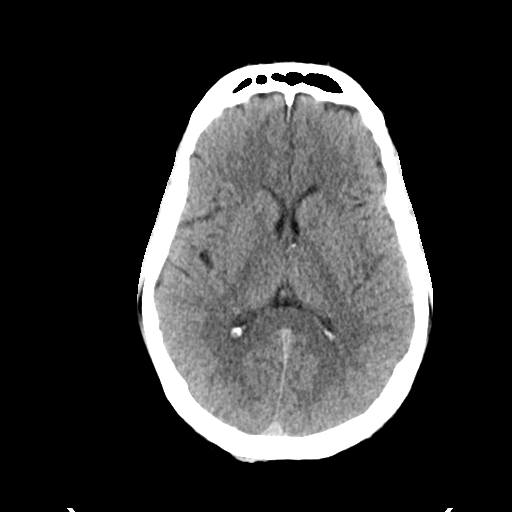
[im 17/32  bone]
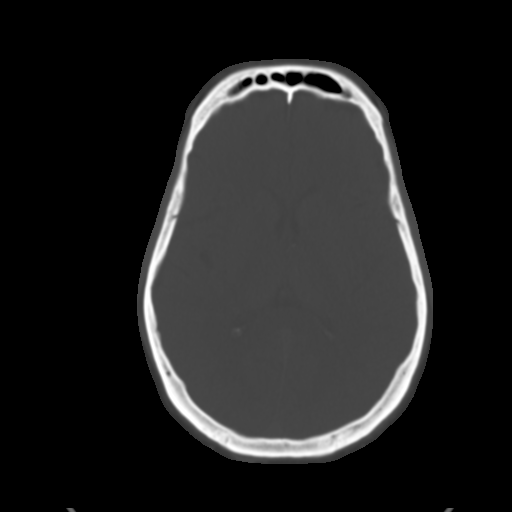
[im 19/32  brain]
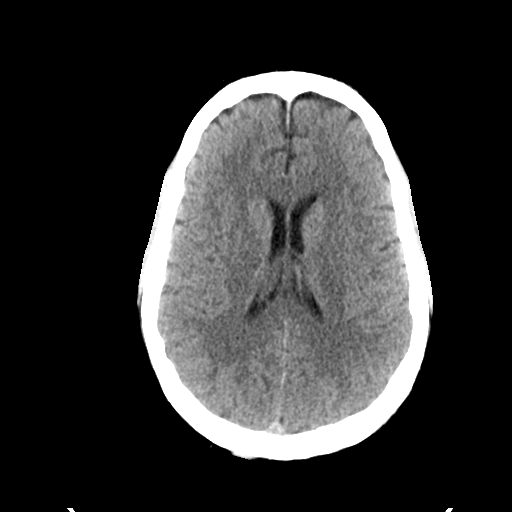
[im 21/32  brain]
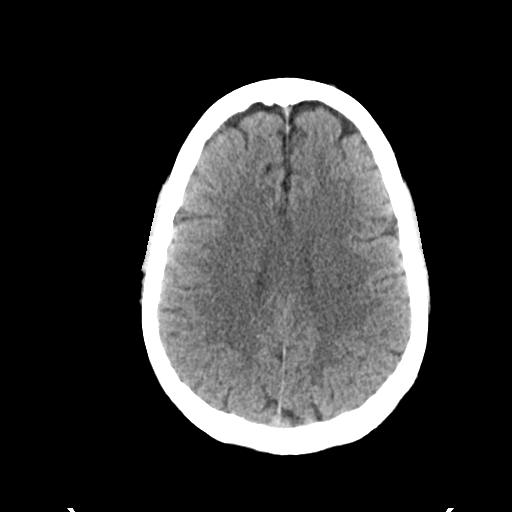
[im 23/32  brain]
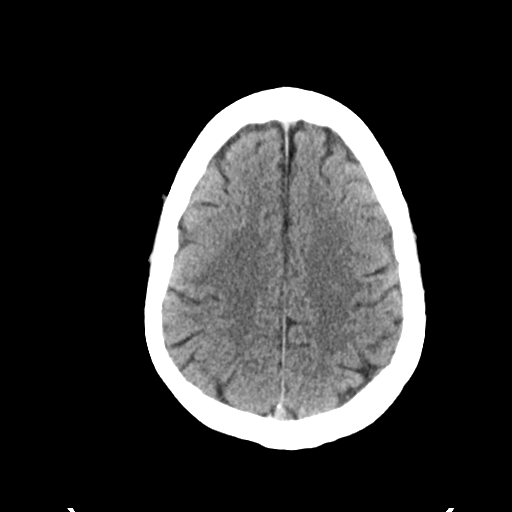
[im 24/32  brain]
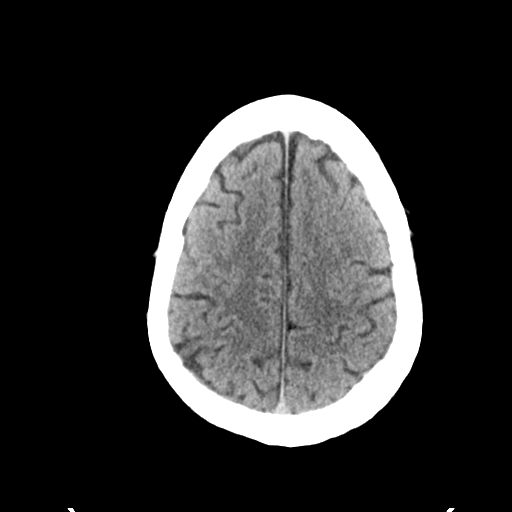
[im 24/32  bone]
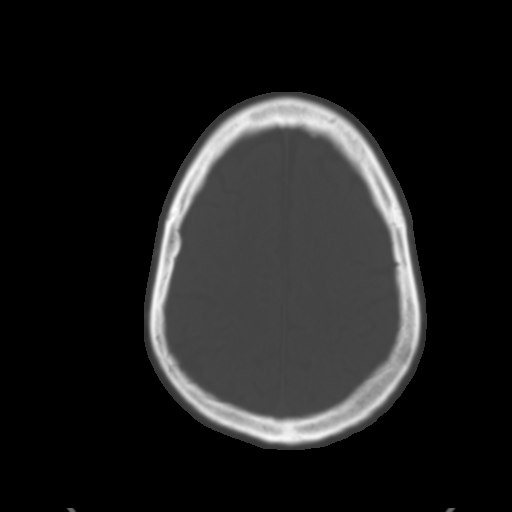
[im 26/32  brain]
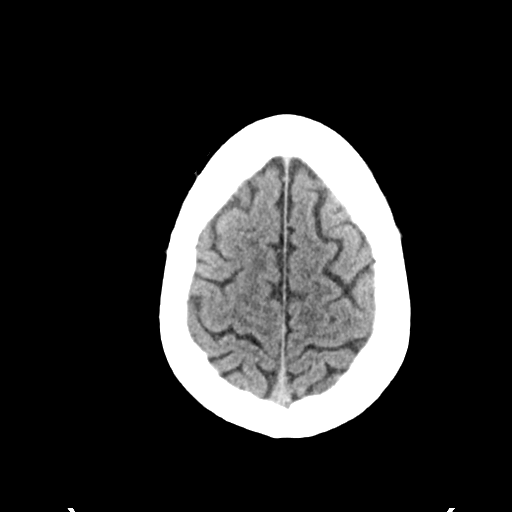
[im 28/32  brain]
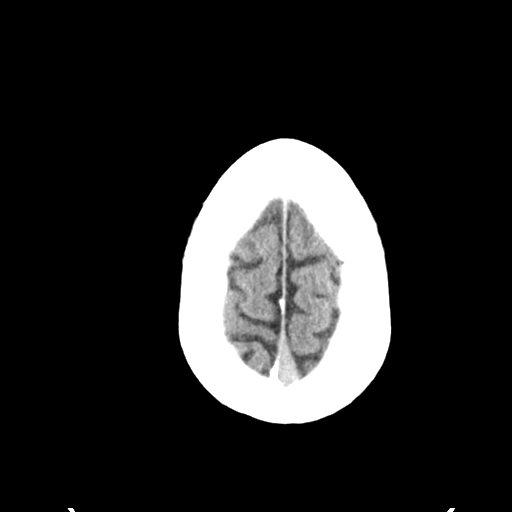
[im 30/32  brain]
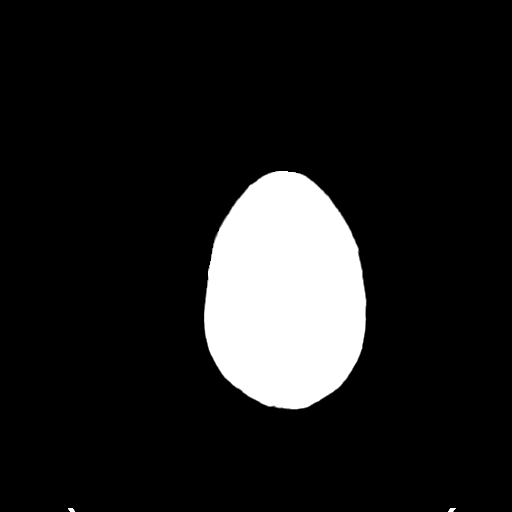

[16 of 30 positions shown; findings below may reference images not displayed]

FINDINGS: Ventricles, cisterns and other CSF spaces are normal. There is no
mass, mass effect, shift of midline structures or acute hemorrhage.
No evidence of acute infarction. Remaining bones and soft tissues
are within normal.
IMPRESSION: Normal head CT.

## 2016-08-05 IMAGING — XA IR THROMBOECTOMY MECHANICAL VENOUS
1 series · 15 of 24 positions shown · non-contrast
Comparison: none

CLINICAL DATA: Left lower leg swelling and pain. Extensive tibial,
femoral and popliteal DVT on sonography.

[Series 1: run · 15 of 43 slices shown]
[im 1/43]
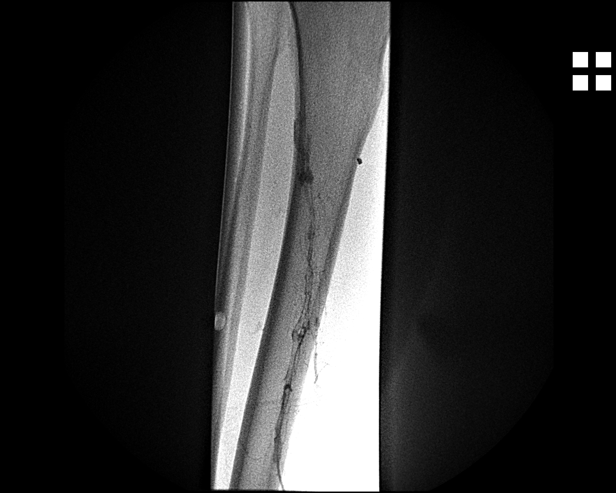
[im 4/43]
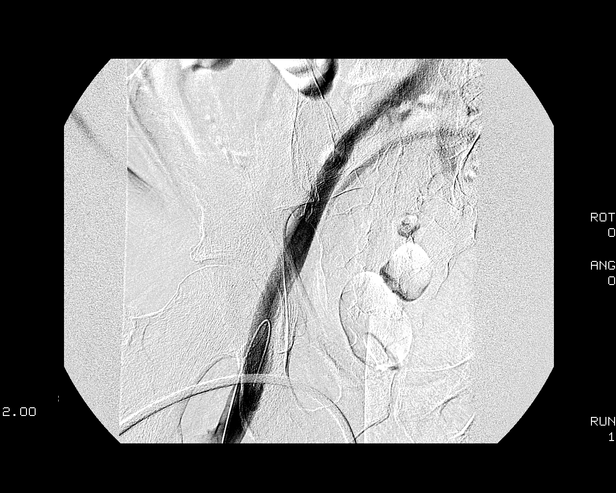
[im 8/43]
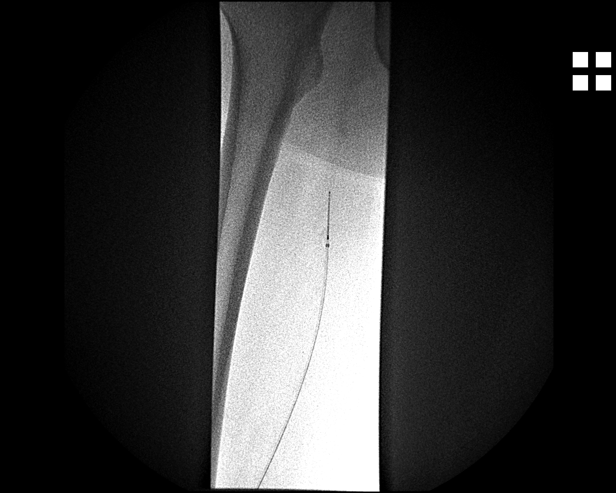
[im 10/43]
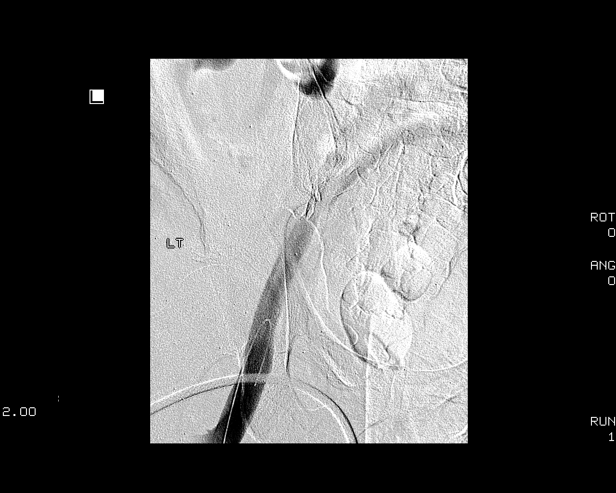
[im 13/43]
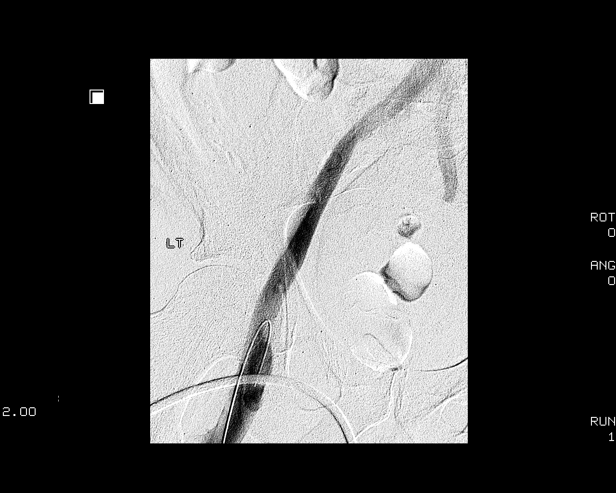
[im 15/43]
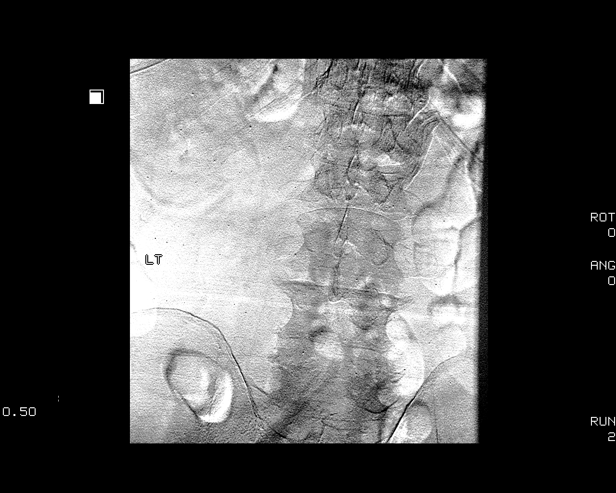
[im 19/43]
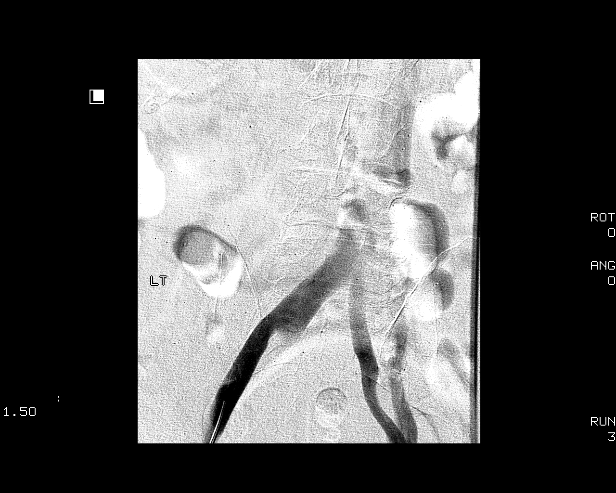
[im 22/43]
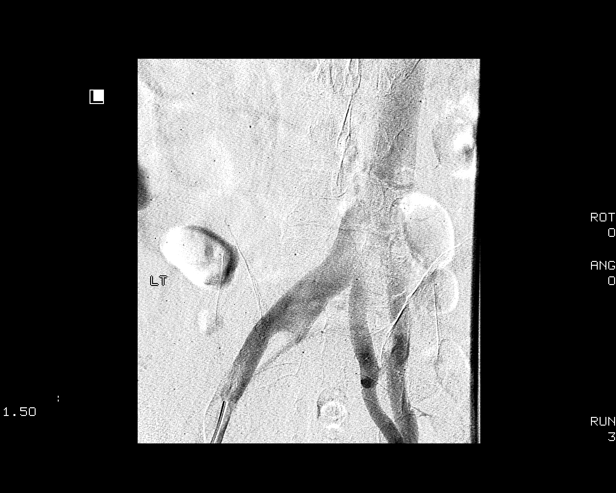
[im 24/43]
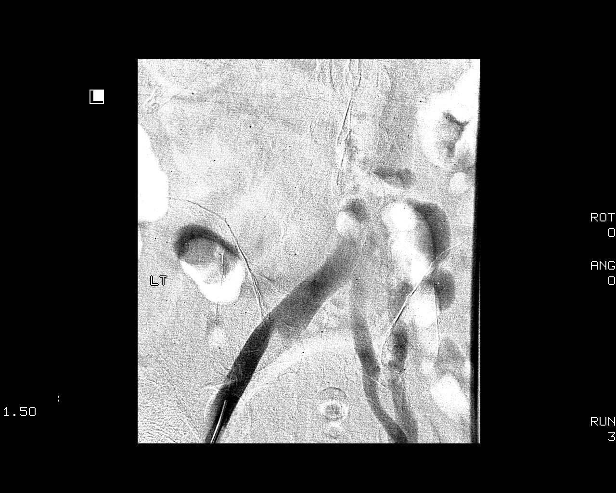
[im 28/43]
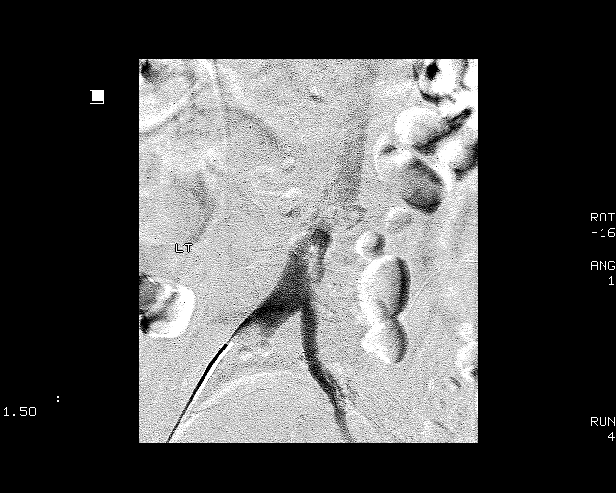
[im 30/43]
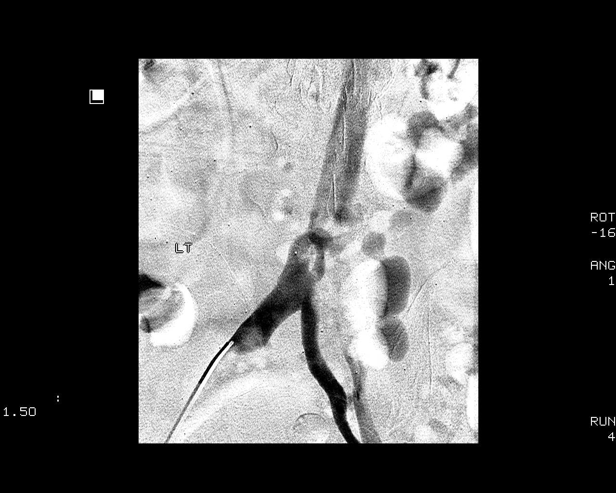
[im 33/43]
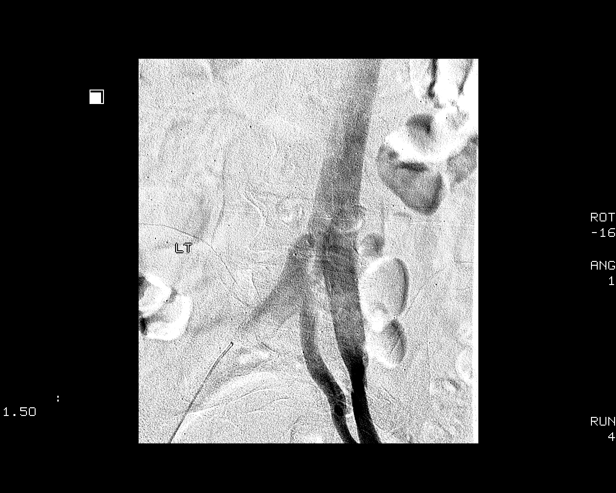
[im 37/43]
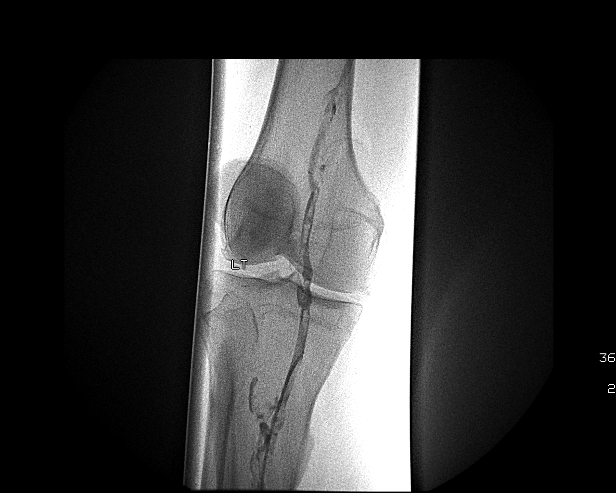
[im 39/43]
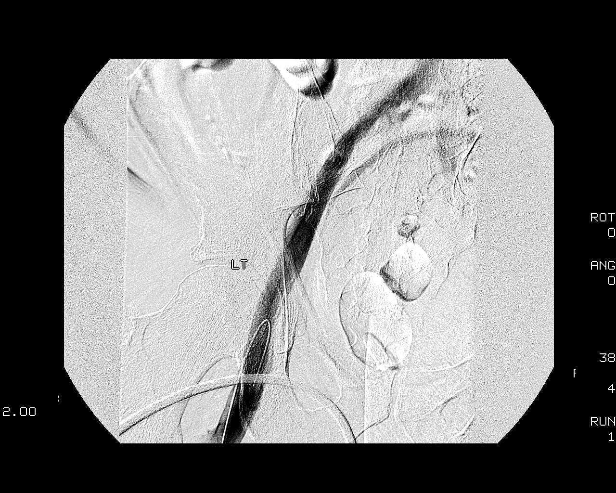
[im 43/43]
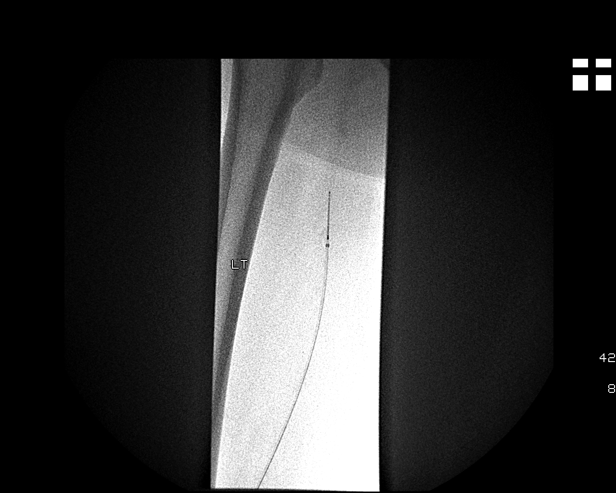

[15 of 24 positions shown; findings below may reference images not displayed]

EXAM:
1. ULTRASOUND GUIDANCE FOR VASCULAR ACCESS OF THE LEFT POSTERIOR
TIBIAL VEIN
2. LEFT LOWER EXTREMITY VENOGRAPHY
3. IVC VENOGRAPHY
4. MECHANICAL THROMBECTOMY OF LEFT LOWER EXTREMITY DEEP VEINS
5. TRANSCATHETER INFUSION VENOUS THROMBOLYTIC THERAPY FOR TREATMENT
OF LEFT LOWER EXTREMITY DEEP VEIN THROMBOSIS.

ANESTHESIA/SEDATION:
Sedation:  4 mg IV Versed sec, 300 mcg IV fentanyl

Total Moderate Sedation Time:  60 minutes.

MEDICATIONS:
Lidocaine 1% subcutaneous

CONTRAST:  80mL OMNIPAQUE IOHEXOL 300 MG/ML  SOLN

FLUOROSCOPY TIME:  6 minutes and 30 seconds.

PROCEDURE:
The procedure, risks (including but not limited to bleeding,
infection, organ damage , pulmonary embolism), benefits, and
alternatives were explained to the patient. Questions regarding the
procedure were encouraged and answered. The patient understands and
consents to the procedure.

Patient was placed prone on the procedure table. Left leg prepped
and draped with Betadine in usual sterile fashion. Maximal barrier
sterile technique was utilized including caps, mask, sterile gowns,
sterile gloves, sterile drape, hand hygiene and skin antiseptic.
After time-out, moderate sedation was initiated.

After the skin was infiltrated with lidocaine, the left posterior
tibial vein was accessed under ultrasound guidance with a 21 gauge
micropuncture set at the ankle level. Venography was performed. The
micropuncture dilator was exchanged for a 6 French vascular sheath,
through which a 5 French catheter was advanced for left lower
extremity venography. The catheter was further advanced over a
guidewire and pelvic iliac vein venography performed. The catheter
was further advanced into the IVC and additional IVC venography
performed.

The 5 French catheter was exchanged over a guidewire for the 6
French Angiojet thrombectomy device. Mechanical thrombectomy was
performed of the posterior tibial, popliteal and femoral veins
utilizing a dilute solution of 20 mg tPA in 1,000 mL saline.
Additional venography was performed with contrast injection.

After removal of the Angiojet device, a 90 cm total length by 50 cm
infusion length 5 French multi side-hole infusion catheter was
advanced over a guidewire. Catheter positioning was confirmed by
fluoroscopy. Distal occlusion wire was placed and infusion of tPA
begun at a dose of 1 milligram/hour. Concomitant IV heparin will be
continued. Angiographic followup will be performed in approximately
24 hours.

COMPLICATIONS:
None
FINDINGS: Initial left lower extremity venography demonstrated occlusive DVT
extending from posterior tibial veins through the popliteal and
femoral veins. Occlusive thrombus terminates in the proximal thigh.
Venography demonstrates a normally patent common femoral and
external iliac vein. The common iliac vein demonstrates chronic
appearing stenosis and web formation consistent with chronic Enyeribe
Latavia syndrome and/or congenital interruption. There is outflow
via a prominent pelvic collateral vein that empties into the
contralateral right external iliac vein. The IVC is normally patent.

After extensive Angiojet thrombectomy with 227 mL of the diluted tPA
solution, some improved patency of the veins was noted. However,
there is clearly a more chronic component of adherent thrombus,
especially in the femoral vein of the thigh. For this reason,
infusion thrombolytic therapy was started via an infusion catheter
with tPA infusion begun at a dose of 1 milligram/hour. The infusion
catheter extends from the lower calf up to the mid thigh. This will
be infused overnight follow-up angiogram performed in roughly 24
hours. At that time, patency of the central common iliac vein
stenosis will be reassessed for potential need of further
intervention (angioplasty and/or intravascular stent placement).
IMPRESSION: 1. Extensive occlusive deep venous thrombus through the left
posterior tibial, popliteal and femoral veins.
2. After mechanical thrombectomy with diluted tPA, significant
thrombus remain present. Thrombolytic infusion catheter was placed
via posterior tibial access and infusion of tPA begun at 1
milligram/hour. This will be continued overnight.
3. Venography demonstrates chronic appearing stenosis and
interruption of the central left common iliac vein with prominent
collateral outflow into the contralateral right external iliac vein.
This appears consistent with chronic stenosis from Yoel Tiger
syndrome and/or congenital interruption/duplication. This will be
re-assessed after thrombolytic therapy to determine whether
angioplasty and/or stent placement may be needed to improve iliac
venous outflow.

## 2016-08-06 IMAGING — CR DG CHEST 1V PORT
1 series · 1 of 1 positions shown · non-contrast
Comparison: Chest CTA 01/05/2015 and earlier.

CLINICAL DATA: 53-year-old male with bilateral pulmonary emboli.
Venous thrombolysis. Initial encounter.

EXAM:
PORTABLE CHEST - 1 VIEW

[AP]
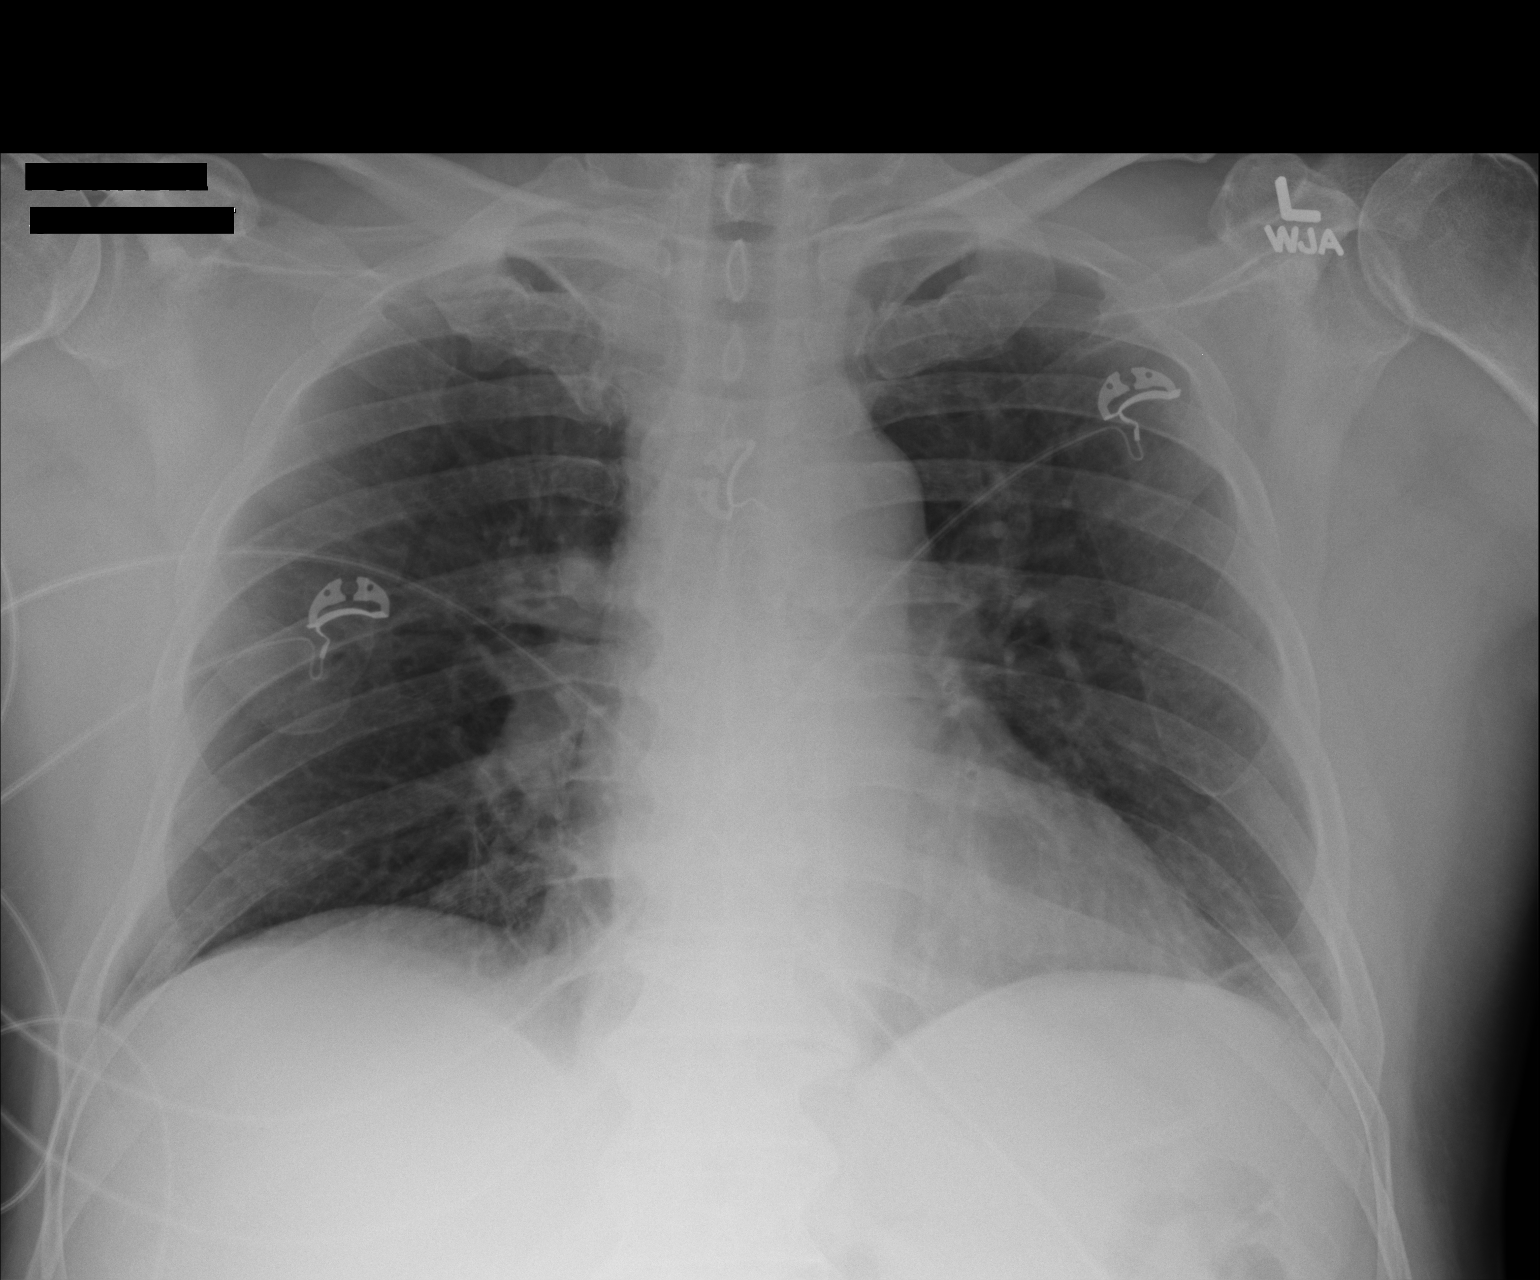

[1 of 1 positions shown; findings below may reference images not displayed]

FINDINGS: Portable AP semi upright view at 7370 hr. Stable lung volumes.
Normal cardiac size and mediastinal contours. Visualized tracheal
air column is within normal limits. No pneumothorax or pulmonary
edema. No pleural effusion or consolidation. Mild curvilinear
opacity in the lingula most resembles atelectasis. No other
confluent opacity.
IMPRESSION: Minor left lung base atelectasis.

## 2016-08-06 IMAGING — XA IR THROMB F/U EVAL ART/VEN FINAL DAY
1 series · 13 of 24 positions shown · IV contrast (IODINE)
Comparison: none

CLINICAL DATA: 53-year-old male with a history of recurrent right
lower extremity DVT. He underwent percutaneous
thrombolysis/thrombectomy yesterday and continued venous
thrombolysis over night. He presents today for re-evaluation.
TECHNIQUE: Informed consent was obtained from the patient following explanation
of the procedure, risks, benefits and alternatives. The patient
understands, agrees and consents for the procedure. All questions
were addressed. A time out was performed.

[Series 300: ir thromb f/u eval art/ven final day (ms · 13 of 85 slices shown]
[im 1/85]
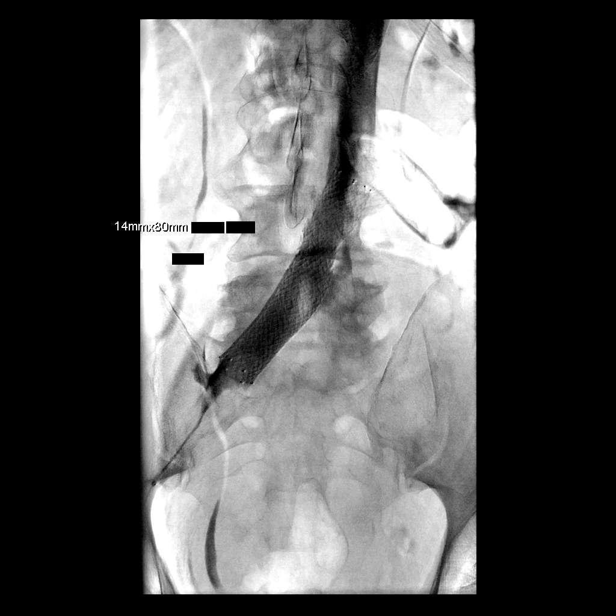
[im 8/85]
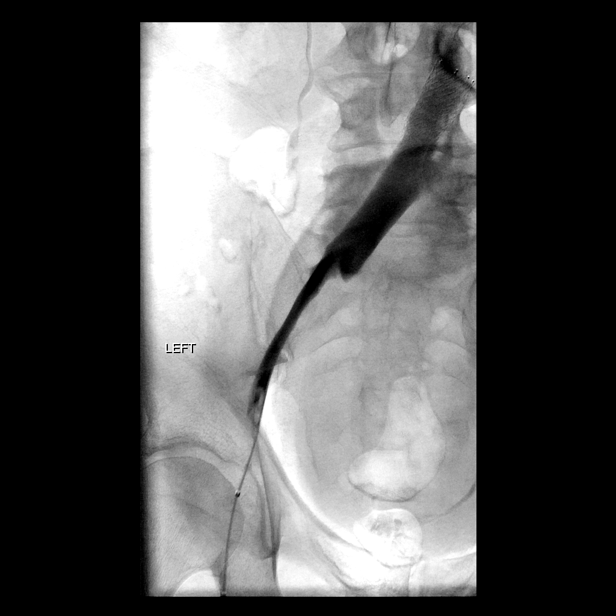
[im 15/85]
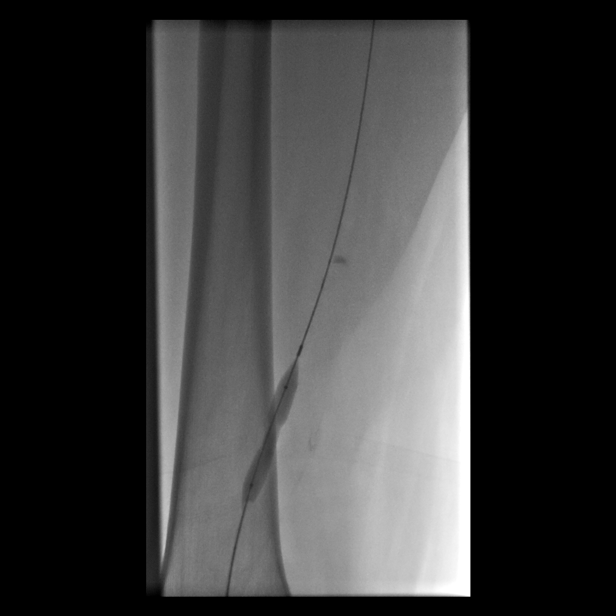
[im 22/85]
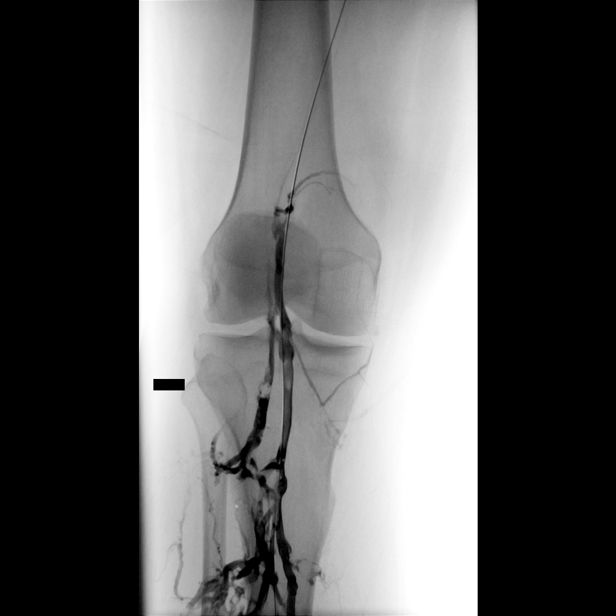
[im 30/85]
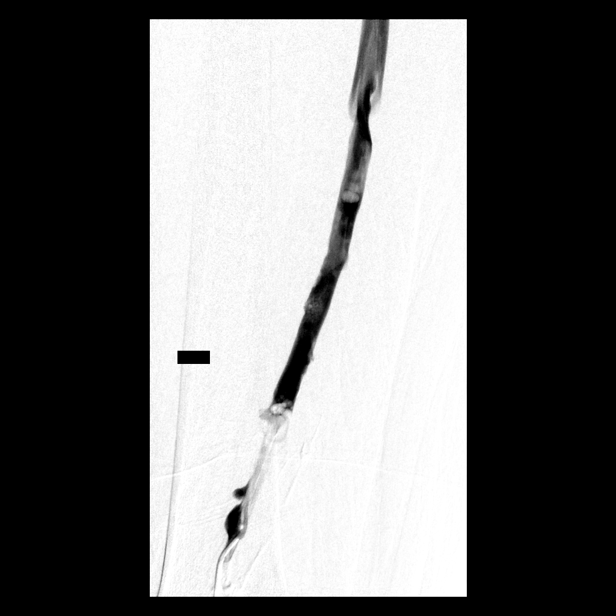
[im 37/85]
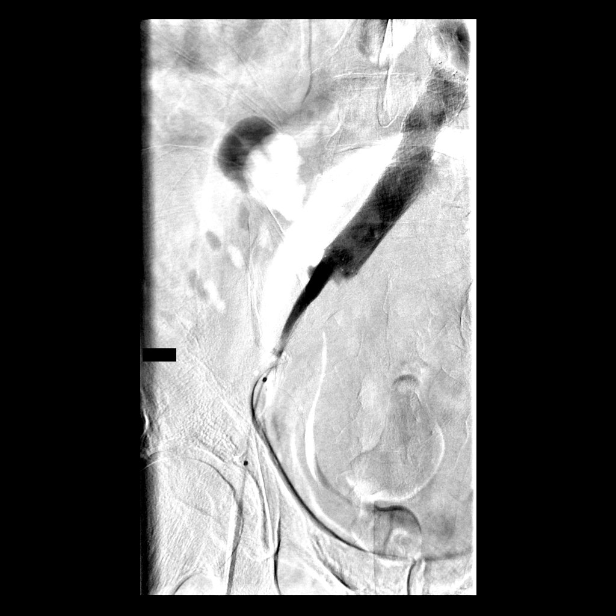
[im 44/85]
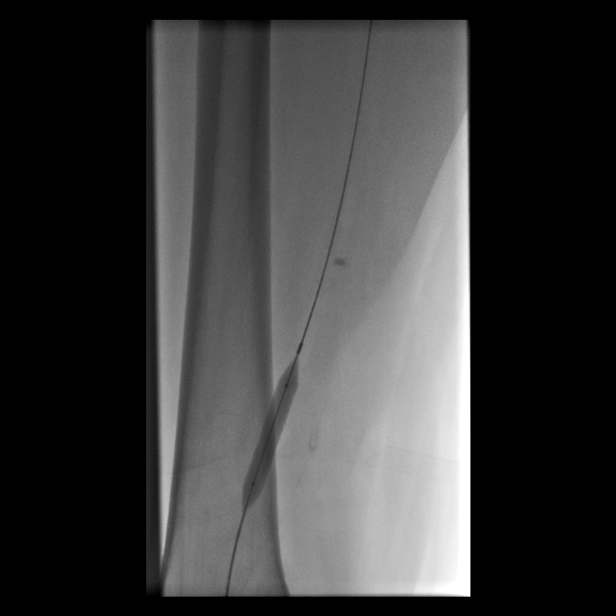
[im 48/85]
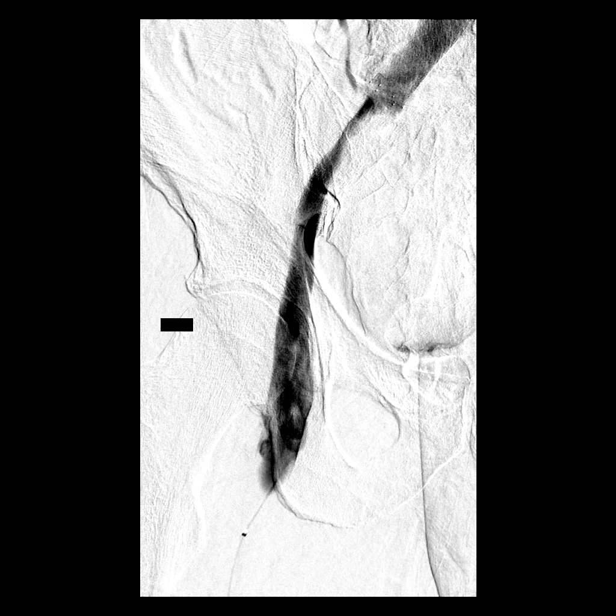
[im 55/85]
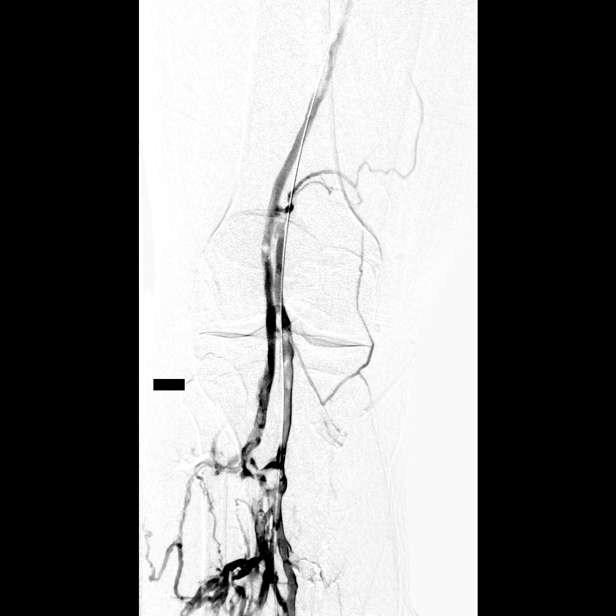
[im 63/85]
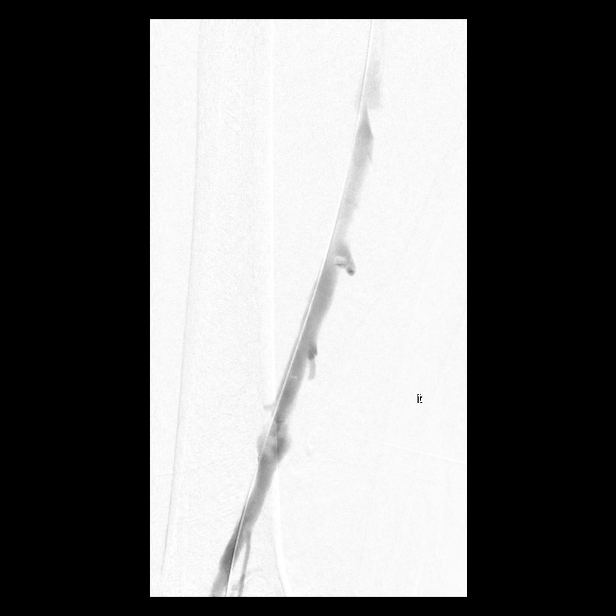
[im 70/85]
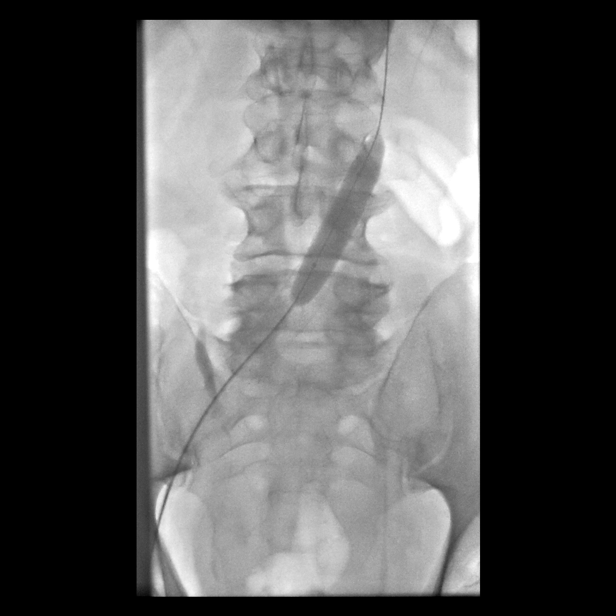
[im 77/85]
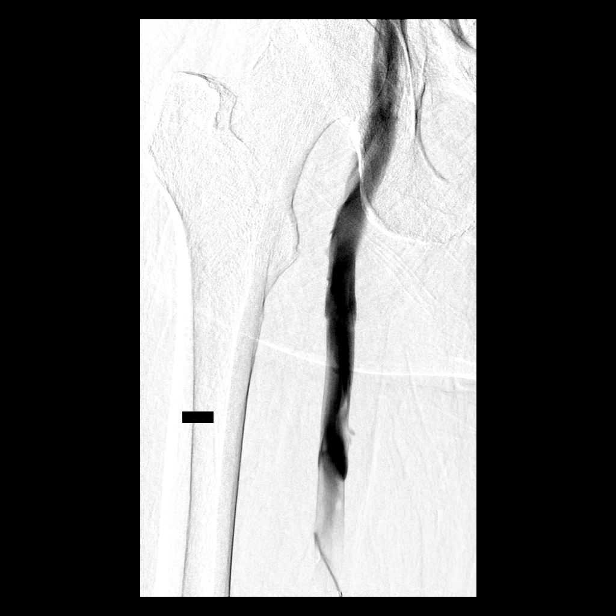
[im 85/85]
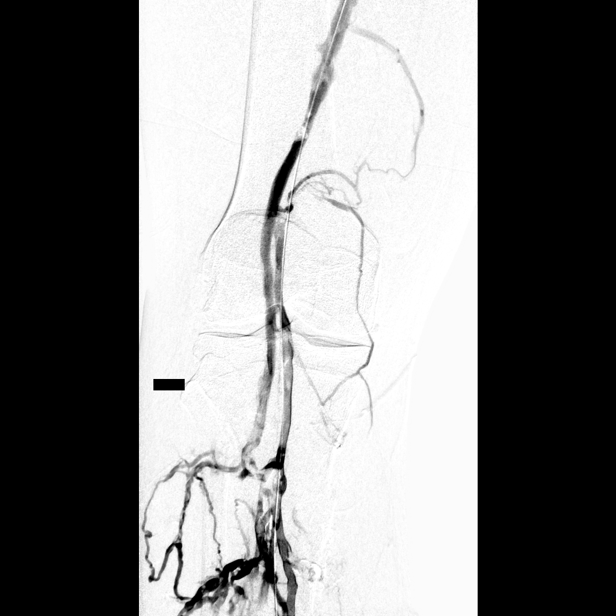

[13 of 24 positions shown; findings below may reference images not displayed]

EXAM:
IR THROMB F/U EVAL RAMOZ/KAM FU FINAL DAY; IR TRANSCATH DENNING STENT INITIAL
VEIN INC ANGIOPLASTY

Date: 01/12/2015

PROCEDURE:
1. Right lower extremity venogram
2. Balloon angioplasty of femoral vein stenosis in the distal thigh
3. Stent placement in the proximal right common iliac vein
4. Completion right lower extremity venogram
5. Cessation of venous thrombolysis

ANESTHESIA/SEDATION:
Moderate (conscious) sedation was used. 4 mg Versed, 200 mcg
Fentanyl were administered intravenously. The patient's vital signs
were monitored continuously by radiology nursing throughout the
procedure.

Sedation Time: 55 minutes

MEDICATIONS:
None additional

FLUOROSCOPY TIME:  9 minutes

7591 mGy

CONTRAST:  120mL OMNIPAQUE IOHEXOL 300 MG/ML  SOLN
Maximal barrier sterile technique utilized including caps, mask,
sterile gowns, sterile gloves, large sterile drape, hand hygiene,
and Betadine skin prep.

The existing lysis catheter was removed over a wire. A venogram was
performed. There is some persistent nonocclusive thrombus in the
region of the tibioperoneal trunk. The popliteal vein is duplicated.
Some mild nonocclusive mobile thrombus is present within the
popliteal vein. There is a focal high-grade stenosis of the femoral
vein in the distal thigh.

The femoral vein stenosis was angioplastied using an 8 x 40 mm
Conquest balloon in overlapping inflations. Inflations were taken to
nominal pressure and maintained for 2 minutes. Follow-up venography
demonstrates complete resolution of the stenosis and improved
antegrade flow. A 5 French catheter was then navigated into the
common femoral vein. A common femoral venogram was performed. The
common femoral and external iliac vein are widely patent. The
catheter was advanced into the common iliac vein. There is a focal
linear compression of the origin of the common iliac vein with a
large collateral providing flow from knee left to the right iliac
system. This appearance is most consistent with Deeqa Rayaan Adlaho
compression.

Therefore, a wire was advanced beyond the stenosis and a 14 x 80 mm
smart stent was deployed. This was post dilated to 12 mm using a 12
x 40 mm must Fatimah balloon. Follow-up venography demonstrates
significantly improved flow through the common iliac vein with no
further filling of the iliac to iliac collateral vessels. There is a
suggestion of some a narrowing in the external iliac vein. This was
angioplastied to 12 mm with the must Fatimah balloon. Follow-up
venography demonstrates improved flow without evidence of residual
stenosis. There is now antegrade flow throughout the right lower
extremity from the tibial veins into the IVC. There is a persistent
very small volume of subocclusive thrombus below the knee.

The wire was removed. The sheath was removed and hemostasis attained
by manual pressure.

COMPLICATIONS:
None
IMPRESSION: 1. Initial venography demonstrates minimal residual nonocclusive
thrombus below the knee and in the popliteal veins, duplicated
popliteal veins, a high-grade stenosis in the femoral vein in the
distal thigh, and evidence [DATE] Tiger physiology with flow
limiting narrowing of the central left common iliac vein.
2. Successful balloon angioplasty of femoral vein stenosis in the
distal thigh to 8 mm.
3. Successful placement of a 14 mm self expanding Nitinol stent
across the Deeqa Rayaan Adlaho lesion.
4. Successful restoration of antegrade flow from the ankle to the
IVC with resolution of collateral venous filling.

PLAN:
1. Continue anticoagulation for at least 6 months.
2. Follow-up in [REDACTED] in 2-4 weeks with
left lower extremity duplex venous ultrasound.

## 2016-08-16 NOTE — Progress Notes (Signed)
[facility]  [phone] [phone]  Clinic Follow Up Note   Patient Care Team: No Pcp Per Patient as PCP - General (General Practice) 08/18/2016  CHIEF COMPLAINTS:  Recurrent PE/DVT   HISTORY OF PRESENTING ILLNESS:  Nicholas [name] 56 y.o. male is here because of recurrent PE and DVT.   He had lumbar lab in ectomy and decompression surgery in October 2015, he was able to move around without too much difficulty after surgery, but was off work and not very physically active. During his 1 months postop follow-up, he was found to be hypoxic in the office, and CT scan reviewed multiple bilateral pulmonary emboli. Ultrasound also showed right deep vein thrombosis involving the right popliteal, posterior tibia, and perineal vein. He was admitted to hospital for a few days and discharged home on Xarelto. He took it for 6 months and stopped it.  He developed worsening left leg swollen, pain, and dyspnea, 1 months after he stopped Xarelto. He was admitted to the hospital on 01/05/2015. He was found to have PE and extensive Lt leg DVT. He was started on heparin gtt. Given large clot burden in LLE he underwent IR guided mechanical thrombectomy and transcatheter thrombolytic therapy to LLE from 8/4-8/5. He tolerated this well, was tx out of ICU and started on rivaroxaban. He is ambulating without difficulty, on RA with stable vitals, no dyspnea or chest pain and much improved leg pain, and was discharged home on 01/11/2015.   He has felt much better since her hospital discharge. His left leg swollen anemia resolved, his dyspnea on exertion is much improved, he is back to walk normally. He denies any significant pain, no other symptoms.  He denies recent history of trauma, long distance travel, dehydration, recent surgery, smoking or prolonged immobilization.  He had no prior history or diagnosis of cancer. He has no family history of thrombosis. His mother and a paternal  grandmother had colon cancer. He had screening colonoscopy 6-7 years ago at the [place], was found to have couple of polyps. He is overdue 2 years for colonoscopy now. He never had PSA screening test before. He is to see his doctor at the [place], but does not want to go back. He does not have a primary care physician.  CURRENT THERAPY: Xarelto 20 mg once daily  INTERIM HISTORY: Nicholas [name] returns for follow up. He is doing good. No new problems with Xarelto and reports no side effects.   MEDICAL HISTORY:  Past Medical History:  Diagnosis Date  . Arthritis    "my whole body"  . Chronic back pain    "mainly lower; some in top too" (03/26/2014)  . DVT (deep venous thrombosis) ([place])   . GERD (gastroesophageal reflux disease)   . History of gout   . Hypertension   . Pulmonary embolism ([place])    Recurrent & unprovoked August 2016. Provoked/Post-op December 2015    SURGICAL HISTORY: Past Surgical History:  Procedure Laterality Date  . BACK SURGERY    . CARPAL TUNNEL RELEASE Bilateral 2012  . DECOMPRESSIVE LUMBAR LAMINECTOMY LEVEL 3 N/A 03/26/2014   Procedure: L3-S1 DECOMPRESSION;  Surgeon: Melina Schools, MD;  Location: [facility];  Service: Orthopedics;  Laterality: N/A;  . INGUINAL HERNIA REPAIR Right 2000's  . LUMBAR LAMINECTOMY/DECOMPRESSION MICRODISCECTOMY  03/26/2014   L3-S1  . lytic therapy  August 2016   For Lower Extremity DVT    SOCIAL HISTORY: Social History   Social History  . Marital status: Married  Spouse name: N/A  . Number of children: N/A  . Years of education: N/A   Occupational History  . Automotive    Social History Main Topics  . Smoking status: Former Smoker    Packs/day: 1.50    Years: 24.00    Types: Cigarettes    Quit date: 06/06/1988  . Smokeless tobacco: Former Systems developer    Types: Snuff, Chew     Comment: Quit chewing tobacco around 2000  . Alcohol use 10.8 oz/week    18 Cans of beer per week     Comment: 03/26/2014 "2-3, 12oz beers/day"  . Drug use: No  .  Sexual activity: Yes   Other Topics Concern  . Not on file   Social History Narrative   Originally from [place]. 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 No pets currently. No bird exposure.     FAMILY HISTORY: Family History  Problem Relation Age of Onset  . Colon cancer Mother   . Colon cancer Paternal Grandmother     colon cancer  . Lung disease Neg Hx     ALLERGIES:  is allergic to penicillins.  MEDICATIONS:  Current Outpatient Prescriptions  Medication Sig Dispense Refill  . colchicine 0.6 MG tablet Take 1 tablet (0.6 mg total) by mouth daily as needed.    Marland Kitchen losartan (COZAAR) 25 MG tablet Take 25 mg by mouth daily.    Marland Kitchen omeprazole (PRILOSEC) 20 MG capsule Take 20 mg by mouth daily.    Marland Kitchen oxycodone (OXY-IR) 5 MG capsule Take 5 mg by mouth every 6 (six) hours as needed for pain.    . predniSONE (DELTASONE) 5 MG tablet Take 5 mg by mouth daily as needed. Reported on 12/23/2015    . probenecid (BENEMID) 500 MG tablet Take 500 mg by mouth daily.     . rivaroxaban (XARELTO) 20 MG TABS tablet 20mg  po daily 30 tablet 5  . zolpidem (AMBIEN) 10 MG tablet Take 10 mg by mouth at bedtime as needed.     No current facility-administered medications for this visit.     REVIEW OF SYSTEMS:   Constitutional: Denies fevers, chills or abnormal night sweats Eyes: Denies blurriness of vision, double vision or watery eyes Ears, nose, mouth, throat, and face: Denies mucositis or sore throat Respiratory: Denies cough, dyspnea or wheezes Cardiovascular: Denies palpitation, chest discomfort or lower extremity swelling Gastrointestinal:  Denies nausea, heartburn or change in bowel habits Skin: Denies abnormal skin rashes Lymphatics: Denies new lymphadenopathy or easy bruising Neurological:Denies numbness, tingling or new weaknesses Behavioral/Psych: Mood is stable, no new changes  All other systems were reviewed with the patient and are negative.  PHYSICAL  EXAMINATION:  ECOG PERFORMANCE STATUS: 0 - Asymptomatic  Vitals:   08/18/16 1453  BP: (!) 138/99  Pulse: 70  Resp: 18  Temp: 97.6 F (36.4 C)   Filed Weights   08/18/16 1453  Weight: 218 lb 12.8 oz (99.2 kg)    GENERAL:alert, no distress and comfortable SKIN: skin color, texture, turgor are normal, no rashes or significant lesions EYES: normal, conjunctiva are pink and non-injected, sclera clear OROPHARYNX:no exudate, no erythema and lips, buccal mucosa, and tongue normal  NECK: supple, thyroid normal size, non-tender, without nodularity LYMPH:  no palpable lymphadenopathy in the cervical, axillary or inguinal LUNGS: clear to auscultation and percussion with normal breathing effort HEART: regular rate & rhythm and no murmurs and no lower extremity edema ABDOMEN:abdomen soft, non-tender and normal bowel sounds Musculoskeletal:no cyanosis  of digits and no clubbing  PSYCH: alert & oriented x 3 with fluent speech NEURO: no focal motor/sensory deficits  LABORATORY DATA:  I have reviewed the data as listed  CBC Latest Ref Rng & Units 08/18/2016 01/21/2016 07/24/2015  WBC 4.0 - 10.3 10e3/uL 4.3 4.7 3.8(L)  Hemoglobin 13.0 - 17.1 g/dL 16.0 15.8 15.2  Hematocrit 38.4 - 49.9 % 47.0 49.9 47.2  Platelets 140 - 400 10e3/uL 162 180 201    CMP Latest Ref Rng & Units 08/18/2016 01/21/2016 07/24/2015  Glucose 70 - 140 mg/dl 106 90 79  BUN 7.0 - 26.0 mg/dL 13.7 15.4 15.0  Creatinine 0.7 - 1.3 mg/dL 1.2 1.2 1.2  Sodium 136 - 145 mEq/L 140 141 142  Potassium 3.5 - 5.1 mEq/L 4.0 4.6 4.6  Chloride 101 - 111 mmol/L - - -  CO2 22 - 29 mEq/L 25 29 27   Calcium 8.4 - 10.4 mg/dL 9.6 9.6 9.4  Total Protein 6.4 - 8.3 g/dL 6.8 6.9 6.7  Total Bilirubin 0.20 - 1.20 mg/dL 0.93 1.19 0.85  Alkaline Phos 40 - 150 U/L 51 61 62  AST 5 - 34 U/L 59(H) 67(H) 43(H)  ALT 0 - 55 U/L 77(H) 74(H) 56(H)     RADIOGRAPHIC STUDIES: I have personally reviewed the radiological images as listed and agreed with the  findings in the report.  Ct Angio Chest Pe W/cm &/or Wo Cm 01/05/2015   IMPRESSION: 1. Bilateral pulmonary embolism but much more significant on the right side with a large central right-sided PE. Mild right ventricular heart strain with slight flattening of the left ventricle. The RV LV ratio is 0.92. 2. No significant pulmonary findings. 3. Normal thoracic aorta.  These results will be called to the ordering clinician or representative by the Radiologist Assistant, and communication documented in the PACS or zVision Dashboard.   Electronically Signed   By: Marijo Sanes M.D.   On: 01/05/2015 13:12   Ir Veno/ext/uni Left 01/09/2015    IMPRESSION: 1. Extensive occlusive deep venous thrombus through the left posterior tibial, popliteal and femoral veins. 2. After mechanical thrombectomy with diluted tPA, significant thrombus remain present. Thrombolytic infusion catheter was placed via posterior tibial access and infusion of tPA begun at 1 milligram/hour. This will be continued overnight. 3. Venography demonstrates chronic appearing stenosis and interruption of the central left common iliac vein with prominent collateral outflow into the contralateral right external iliac vein. This appears consistent with chronic stenosis from May Thurner syndrome and/or congenital interruption/duplication. This will be re-assessed after thrombolytic therapy to determine whether angioplasty and/or stent placement may be needed to improve iliac venous outflow.   Electronically Signed   By: Aletta Edouard M.D.   On: 01/09/2015 08:07    [place] Angioplasty 01/12/2015    IMPRESSION: 1. Initial venography demonstrates minimal residual nonocclusive thrombus below the knee and in the popliteal veins, duplicated popliteal veins, a high-grade stenosis in the femoral vein in the distal thigh, and evidence of May Thurner physiology with flow limiting narrowing of the central left common iliac vein. 2.  Successful balloon angioplasty of femoral vein stenosis in the distal thigh to 8 mm. 3. Successful placement of a 14 mm self expanding Nitinol stent across the May Thurner lesion. 4. Successful restoration of antegrade flow from the ankle to the IVC with resolution of collateral venous filling.  PLAN: 1. Continue anticoagulation for at least 6 months. 2. Follow-up in interventional radiology clinic in 2-4  weeks with left lower extremity duplex venous ultrasound.  Signed,  Criselda Peaches, MD  [facility]  [facility]   Electronically Signed   By: Jacqulynn Cadet M.D.   On: 01/12/2015 17:26    US Venous IMG [place] Unilateral Left 03/25/15 IMPRESSION: 1. No evidence of new or acute deep venous thrombosis. 2. The left common femoral, profunda femoral and femoral veins are completely patent. Excellent respiratory phasicity suggests central patency of the stented iliac venous segment. 3. Mild residual nonocclusive chronic DVT which has largely recanalized in the duplicated popliteal vein and upper calf veins.    ASSESSMENT: 56 y.o. male without significant past medical history, presented with recurrent extensive DVT and PE. The first episode was probably revoked by spinal surgery, and a second episode was unprovoked. 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  1. Recurrent DVT and PE -His previous hypercoagulopathy workup was negative for prothrombin gene mutation, factor V Leyden mutation, antithrombin III, he had normal protein C and protein S activities, but he had (+) lupus anticoagulant and slightly elevated homocystine level which may contributed to his thrombosis.  -I have repeated his lupus anticoagulant today, to see if he has antiphospholipid syndrome. The result is still pending  -I recommend lifelong anticoagulation if no  contraindications -His tolerating Xeloda well, we'll continue. -I previously reviewed his lab results, his CBC and CMP were unremarkable except mild elevated liver enzymes - patient wears compression socks during the day. - I strongly encouraged him to find a PCP  2. Financial  - Patient is concerned about co pay for [employer]. He has asked if we have sample in office. I asked our oral pharmacy pharmacist Denyse Amass distributed to the patient after my visit. Copay assistant card was provided to patient again. - We discussed the option of changing to another Coumadin ,which is cheaper, if co-pay can not be reduced. We also discussed the benefits and side effects of it. He understands.   3. Transaminitis -He has had mild elevated liver enzymes, overall stable. We'll continue monitoring -will check Hep B/C on next visit   Follow up -He will continue Xarelto 20 mg daily indefinitely. We provided him with co-pay assistant card today -Lab and follow-up in 6 months  All questions were answered. The patient knows to call the clinic with any problems, questions or concerns.  I spent 15 minutes counseling the patient face to face. The total time spent in the appointment was 20 minutes and more than 50% was on counseling.  This document serves as a record of services personally performed by [name], MD. It was created on her behalf by Brandt Loosen, a trained medical scribe. The creation of this record is based on the scribe's personal observations and the provider's statements to them. This document has been checked and approved by the attending provider.     [name], MD 08/19/2016

## 2016-08-18 ENCOUNTER — Telehealth: Payer: Self-pay | Admitting: Pharmacist

## 2016-08-18 ENCOUNTER — Other Ambulatory Visit (HOSPITAL_BASED_OUTPATIENT_CLINIC_OR_DEPARTMENT_OTHER): Payer: BLUE CROSS/BLUE SHIELD

## 2016-08-18 ENCOUNTER — Ambulatory Visit (HOSPITAL_BASED_OUTPATIENT_CLINIC_OR_DEPARTMENT_OTHER): Payer: BLUE CROSS/BLUE SHIELD | Admitting: Hematology

## 2016-08-18 ENCOUNTER — Telehealth: Payer: Self-pay | Admitting: Hematology

## 2016-08-18 VITALS — BP 138/99 | HR 70 | Temp 97.6°F | Resp 18 | Ht 70.0 in | Wt 218.8 lb

## 2016-08-18 DIAGNOSIS — Z7901 Long term (current) use of anticoagulants: Secondary | ICD-10-CM

## 2016-08-18 DIAGNOSIS — I82402 Acute embolism and thrombosis of unspecified deep veins of left lower extremity: Secondary | ICD-10-CM

## 2016-08-18 DIAGNOSIS — I2699 Other pulmonary embolism without acute cor pulmonale: Secondary | ICD-10-CM | POA: Diagnosis not present

## 2016-08-18 DIAGNOSIS — R7401 Elevation of levels of liver transaminase levels: Secondary | ICD-10-CM

## 2016-08-18 DIAGNOSIS — Z86711 Personal history of pulmonary embolism: Secondary | ICD-10-CM | POA: Diagnosis not present

## 2016-08-18 DIAGNOSIS — Z86718 Personal history of other venous thrombosis and embolism: Secondary | ICD-10-CM

## 2016-08-18 DIAGNOSIS — R740 Nonspecific elevation of levels of transaminase and lactic acid dehydrogenase [LDH]: Secondary | ICD-10-CM

## 2016-08-18 LAB — CBC WITH DIFFERENTIAL/PLATELET
BASO%: 0.2 % (ref 0.0–2.0)
Basophils Absolute: 0 10*3/uL (ref 0.0–0.1)
EOS%: 8.6 % — ABNORMAL HIGH (ref 0.0–7.0)
Eosinophils Absolute: 0.4 10*3/uL (ref 0.0–0.5)
HCT: 47 % (ref 38.4–49.9)
HGB: 16 g/dL (ref 13.0–17.1)
LYMPH#: 1.3 10*3/uL (ref 0.9–3.3)
LYMPH%: 30.7 % (ref 14.0–49.0)
MCH: 30.7 pg (ref 27.2–33.4)
MCHC: 34 g/dL (ref 32.0–36.0)
MCV: 90 fL (ref 79.3–98.0)
MONO#: 0.4 10*3/uL (ref 0.1–0.9)
MONO%: 9.5 % (ref 0.0–14.0)
NEUT%: 51 % (ref 39.0–75.0)
NEUTROS ABS: 2.2 10*3/uL (ref 1.5–6.5)
PLATELETS: 162 10*3/uL (ref 140–400)
RBC: 5.22 10*6/uL (ref 4.20–5.82)
RDW: 13.7 % (ref 11.0–14.6)
WBC: 4.3 10*3/uL (ref 4.0–10.3)

## 2016-08-18 LAB — COMPREHENSIVE METABOLIC PANEL
ALT: 77 U/L — AB (ref 0–55)
ANION GAP: 9 meq/L (ref 3–11)
AST: 59 U/L — ABNORMAL HIGH (ref 5–34)
Albumin: 3.9 g/dL (ref 3.5–5.0)
Alkaline Phosphatase: 51 U/L (ref 40–150)
BILIRUBIN TOTAL: 0.93 mg/dL (ref 0.20–1.20)
BUN: 13.7 mg/dL (ref 7.0–26.0)
CO2: 25 meq/L (ref 22–29)
CREATININE: 1.2 mg/dL (ref 0.7–1.3)
Calcium: 9.6 mg/dL (ref 8.4–10.4)
Chloride: 106 mEq/L (ref 98–109)
EGFR: 66 mL/min/{1.73_m2} — ABNORMAL LOW (ref >90)
GLUCOSE: 106 mg/dL (ref 70–140)
Potassium: 4 mEq/L (ref 3.5–5.1)
Sodium: 140 mEq/L (ref 136–145)
TOTAL PROTEIN: 6.8 g/dL (ref 6.4–8.3)

## 2016-08-18 MED ORDER — RIVAROXABAN 20 MG PO TABS: ORAL_TABLET | ORAL | 5 refills | Status: DC

## 2016-08-18 NOTE — Telephone Encounter (Signed)
Gave patient AVS and calender per 08/18/2016 los 

## 2016-08-18 NOTE — Telephone Encounter (Signed)
Oral Chemotherapy Pharmacist Encounter  I met with patient in exam room today to discuss options for copayment assistance for Xarelto. Patient has been on Xarelto since at least 2016 and uses a copay card since he has Pharmacist, community and his copayment is usually $10/month Since the copay card from [employer] has a maximum benefit of %3400/year, patient's copayment for Xarelto increased to ~$450 for November and December of 2017. This is prohibitively expensive.  I provided patient with a voucher for a 2-monthsupply of Xarelto and a new copay card. Patient did state that his copays for January and Feb of 2018 have been back down to $10/month.  Patient will continue to use copay card up to its maximum benefit. Once that point is reached, he will use provided voucher for 153-monthree supply and call the office at that time to try to obtain samples to get him through the remainder of the year.  Patient understands and is in agreement with above plan. He knows to call the office with questions or concerns.  JeJohny DrillingPharmD, BCPS, BCOP 08/18/2016  3:19 PM [facility] [phone]

## 2016-08-19 ENCOUNTER — Encounter: Payer: Self-pay | Admitting: Hematology

## 2016-08-21 LAB — LUPUS ANTICOAGULANT PANEL
DRVVT CONFIRM: 2 ratio — AB (ref 0.8–1.2)
DRVVT MIX: 90.5 s — AB (ref 0.0–47.0)
DRVVT: 140.6 s — AB (ref 0.0–47.0)
PTT-LA: 40.4 s (ref 0.0–51.9)

## 2016-08-24 ENCOUNTER — Telehealth: Payer: Self-pay | Admitting: *Deleted

## 2016-08-24 NOTE — Telephone Encounter (Signed)
I was there on the 15 th.  My lab results weren't ready and I would like these mailed to my home."  Confirmed address.  Results will be mailed.

## 2017-01-23 ENCOUNTER — Telehealth: Payer: Self-pay | Admitting: Hematology

## 2017-01-23 NOTE — Telephone Encounter (Signed)
Called and left message for patient regarding appointment 9/6.

## 2017-02-08 ENCOUNTER — Ambulatory Visit: Payer: BLUE CROSS/BLUE SHIELD | Admitting: Hematology

## 2017-02-08 ENCOUNTER — Other Ambulatory Visit: Payer: BLUE CROSS/BLUE SHIELD

## 2017-02-08 NOTE — Progress Notes (Signed)
[facility]  [phone] [phone]  Clinic Follow Up Note   Patient Care Team: Patient, No Pcp Per as PCP - General (General Practice) 02/09/2017  CHIEF COMPLAINTS:  Recurrent PE/DVT   HISTORY OF PRESENTING ILLNESS:  Nicholas [name] 56 y.o. male is here because of recurrent PE and DVT.   He had lumbar lab in ectomy and decompression surgery in October 2015, he was able to move around without too much difficulty after surgery, but was off work and not very physically active. During his 1 months postop follow-up, he was found to be hypoxic in the office, and CT scan reviewed multiple bilateral pulmonary emboli. Ultrasound also showed right deep vein thrombosis involving the right popliteal, posterior tibia, and perineal vein. He was admitted to hospital for a few days and discharged home on Xarelto. He took it for 6 months and stopped it.  He developed worsening left leg swollen, pain, and dyspnea, 1 months after he stopped Xarelto. He was admitted to the hospital on 01/05/2015. He was found to have PE and extensive Lt leg DVT. He was started on heparin gtt. Given large clot burden in LLE he underwent IR guided mechanical thrombectomy and transcatheter thrombolytic therapy to LLE from 8/4-8/5. He tolerated this well, was tx out of ICU and started on rivaroxaban. He is ambulating without difficulty, on RA with stable vitals, no dyspnea or chest pain and much improved leg pain, and was discharged home on 01/11/2015.   He has felt much better since her hospital discharge. His left leg swollen anemia resolved, his dyspnea on exertion is much improved, he is back to walk normally. He denies any significant pain, no other symptoms.  He denies recent history of trauma, long distance travel, dehydration, recent surgery, smoking or prolonged immobilization.  He had no prior history or diagnosis of cancer. He has no family history of thrombosis. His mother and a paternal  grandmother had colon cancer. He had screening colonoscopy 6-7 years ago at the [place], was found to have couple of polyps. He is overdue 2 years for colonoscopy now. He never had PSA screening test before. He is to see his doctor at the [place], but does not want to go back. He does not have a primary care physician.  CURRENT THERAPY: Xarelto 20 mg once daily  INTERIM HISTORY: Nicholas [name] returns for follow up. He reports that he is doing well overall. He is waiting for Xarelto prescription to come from [facility] and he notes that he has 1 month supply of Xarelto left. He is not currently paying a copay for Xarelto. He is requesting a refill. His BP is consistently elevated in our office, 139/98 today and 138/99 previous. He takes losartan and sees a [place] provider 1-2 times per year. He denies having a PCP at this time. Discussed health maintenance. He reports that he has had 3 colonoscopies in the past. Denies family hx of prostate cancer.   On review of systems, denies bruising.   MEDICAL HISTORY:  Past Medical History:  Diagnosis Date  . Arthritis    "my whole body"  . Chronic back pain    "mainly lower; some in top too" (03/26/2014)  . DVT (deep venous thrombosis) ([place])   . GERD (gastroesophageal reflux disease)   . History of gout   . Hypertension   . Pulmonary embolism ([place])    Recurrent & unprovoked August 2016. Provoked/Post-op December 2015   SURGICAL HISTORY: Past Surgical History:  Procedure Laterality  Date  . BACK SURGERY    . CARPAL TUNNEL RELEASE Bilateral 2012  . DECOMPRESSIVE LUMBAR LAMINECTOMY LEVEL 3 N/A 03/26/2014   Procedure: L3-S1 DECOMPRESSION;  Surgeon: Melina Schools, MD;  Location: [facility];  Service: Orthopedics;  Laterality: N/A;  . INGUINAL HERNIA REPAIR Right 2000's  . LUMBAR LAMINECTOMY/DECOMPRESSION MICRODISCECTOMY  03/26/2014   L3-S1  . lytic therapy  August 2016   For Lower Extremity DVT   SOCIAL HISTORY: Social History   Social History  . Marital status: Married     Spouse name: N/A  . Number of children: N/A  . Years of education: N/A   Occupational History  . Automotive    Social History Main Topics  . Smoking status: Former Smoker    Packs/day: 1.50    Years: 24.00    Types: Cigarettes    Quit date: 06/06/1988  . Smokeless tobacco: Former Systems developer    Types: Snuff, Chew     Comment: Quit chewing tobacco around 2000  . Alcohol use 10.8 oz/week    18 Cans of beer per week     Comment: 03/26/2014 "2-3, 12oz beers/day"  . Drug use: No  . Sexual activity: Yes   Other Topics Concern  . Not on file   Social History Narrative   Originally from [place]. 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 No pets currently. No bird exposure.     FAMILY HISTORY: Family History  Problem Relation Age of Onset  . Colon cancer Mother   . Colon cancer Paternal Grandmother        colon cancer  . Lung disease Neg Hx    ALLERGIES:  is allergic to penicillins.  MEDICATIONS:  Current Outpatient Prescriptions  Medication Sig Dispense Refill  . colchicine 0.6 MG tablet Take 1 tablet (0.6 mg total) by mouth daily as needed.    Marland Kitchen losartan (COZAAR) 25 MG tablet Take 25 mg by mouth daily.    Marland Kitchen omeprazole (PRILOSEC) 20 MG capsule Take 20 mg by mouth daily.    Marland Kitchen oxycodone (OXY-IR) 5 MG capsule Take 5 mg by mouth every 6 (six) hours as needed for pain.    . probenecid (BENEMID) 500 MG tablet Take 500 mg by mouth daily.     . rivaroxaban (XARELTO) 20 MG TABS tablet 20mg  po daily 30 tablet 5  . zolpidem (AMBIEN) 10 MG tablet Take 10 mg by mouth at bedtime as needed.    . predniSONE (DELTASONE) 5 MG tablet Take 5 mg by mouth daily as needed. Reported on 12/23/2015     No current facility-administered medications for this visit.    REVIEW OF SYSTEMS:  Constitutional: Denies fevers, chills or abnormal night sweats Eyes: Denies blurriness of vision, double vision or watery eyes Ears, nose, mouth, throat, and face: Denies mucositis or  sore throat Respiratory: Denies cough, dyspnea or wheezes Cardiovascular: Denies palpitation, chest discomfort or lower extremity swelling Gastrointestinal:  Denies nausea, heartburn or change in bowel habits Skin: Denies abnormal skin rashes Lymphatics: Denies new lymphadenopathy or easy bruising Neurological:Denies numbness, tingling or new weaknesses Behavioral/Psych: Mood is stable, no new changes  All other systems were reviewed with the patient and are negative.  PHYSICAL EXAMINATION:  ECOG PERFORMANCE STATUS: 0 - Asymptomatic BP (!) 139/98 (BP Location: Left Arm, Patient Position: Sitting) Comment: nurse aware  Pulse 81   Temp 98.6 F (37 C) (Oral)   Resp 18   Ht 5\' 10"  (1.778 m)   Wt 219  lb 11.2 oz (99.7 kg)   SpO2 99%   BMI 31.52 kg/m   GENERAL:alert, no distress and comfortable SKIN: skin color, texture, turgor are normal, no rashes or significant lesions EYES: normal, conjunctiva are pink and non-injected, sclera clear OROPHARYNX:no exudate, no erythema and lips, buccal mucosa, and tongue normal  NECK: supple, thyroid normal size, non-tender, without nodularity LYMPH:  no palpable lymphadenopathy in the cervical, axillary or inguinal LUNGS: clear to auscultation and percussion with normal breathing effort HEART: regular rate & rhythm and no murmurs and no lower extremity edema ABDOMEN:abdomen soft, non-tender and normal bowel sounds Musculoskeletal:no cyanosis of digits and no clubbing  PSYCH: alert & oriented x 3 with fluent speech NEURO: no focal motor/sensory deficits  LABORATORY DATA:  I have reviewed the data as listed  CBC Latest Ref Rng & Units 02/09/2017 08/18/2016 01/21/2016  WBC 4.0 - 10.3 10e3/uL 4.6 4.3 4.7  Hemoglobin 13.0 - 17.1 g/dL 16.7 16.0 15.8  Hematocrit 38.4 - 49.9 % 50.3(H) 47.0 49.9  Platelets 140 - 400 10e3/uL 190 162 180    CMP Latest Ref Rng & Units 02/09/2017 08/18/2016 01/21/2016  Glucose 70 - 140 mg/dl 86 106 90  BUN 7.0 - 26.0 mg/dL  14.2 13.7 15.4  Creatinine 0.7 - 1.3 mg/dL 1.4(H) 1.2 1.2  Sodium 136 - 145 mEq/L 141 140 141  Potassium 3.5 - 5.1 mEq/L 4.4 4.0 4.6  Chloride 101 - 111 mmol/L - - -  CO2 22 - 29 mEq/L 25 25 29   Calcium 8.4 - 10.4 mg/dL 9.8 9.6 9.6  Total Protein 6.4 - 8.3 g/dL 7.0 6.8 6.9  Total Bilirubin 0.20 - 1.20 mg/dL 0.81 0.93 1.19  Alkaline Phos 40 - 150 U/L 52 51 61  AST 5 - 34 U/L 51(H) 59(H) 67(H)  ALT 0 - 55 U/L 77(H) 77(H) 74(H)    RADIOGRAPHIC STUDIES: I have personally reviewed the radiological images as listed and agreed with the findings in the report.  Ct Angio Chest Pe W/cm &/or Wo Cm 01/05/2015   IMPRESSION: 1. Bilateral pulmonary embolism but much more significant on the right side with a large central right-sided PE. Mild right ventricular heart strain with slight flattening of the left ventricle. The RV LV ratio is 0.92. 2. No significant pulmonary findings. 3. Normal thoracic aorta.  These results will be called to the ordering clinician or representative by the Radiologist Assistant, and communication documented in the PACS or zVision Dashboard.   Electronically Signed   By: Marijo Sanes M.D.   On: 01/05/2015 13:12   Ir Veno/ext/uni Left 01/09/2015    IMPRESSION: 1. Extensive occlusive deep venous thrombus through the left posterior tibial, popliteal and femoral veins. 2. After mechanical thrombectomy with diluted tPA, significant thrombus remain present. Thrombolytic infusion catheter was placed via posterior tibial access and infusion of tPA begun at 1 milligram/hour. This will be continued overnight. 3. Venography demonstrates chronic appearing stenosis and interruption of the central left common iliac vein with prominent collateral outflow into the contralateral right external iliac vein. This appears consistent with chronic stenosis from May Thurner syndrome and/or congenital interruption/duplication. This will be re-assessed after thrombolytic therapy to determine whether angioplasty  and/or stent placement may be needed to improve iliac venous outflow.   Electronically Signed   By: Aletta Edouard M.D.   On: 01/09/2015 08:07   [facility] Angioplasty 01/12/2015    IMPRESSION: 1. Initial venography demonstrates minimal residual nonocclusive thrombus below the knee  and in the popliteal veins, duplicated popliteal veins, a high-grade stenosis in the femoral vein in the distal thigh, and evidence of May Thurner physiology with flow limiting narrowing of the central left common iliac vein. 2. Successful balloon angioplasty of femoral vein stenosis in the distal thigh to 8 mm. 3. Successful placement of a 14 mm self expanding Nitinol stent across the May Thurner lesion. 4. Successful restoration of antegrade flow from the ankle to the IVC with resolution of collateral venous filling.  PLAN: 1. Continue anticoagulation for at least 6 months. 2. Follow-up in interventional radiology clinic in 2-4 weeks with left lower extremity duplex venous ultrasound.  Signed,  Criselda Peaches, MD  [facility]  [facility]   Electronically Signed   By: Jacqulynn Cadet M.D.   On: 01/12/2015 17:26    US Venous IMG [facility] Unilateral Left 03/25/15 IMPRESSION: 1. No evidence of new or acute deep venous thrombosis. 2. The left common femoral, profunda femoral and femoral veins are completely patent. Excellent respiratory phasicity suggests central patency of the stented iliac venous segment. 3. Mild residual nonocclusive chronic DVT which has largely recanalized in the duplicated popliteal vein and upper calf veins.  ASSESSMENT: 56 y.o. male without significant past medical history, presented with recurrent extensive DVT and PE. The first episode was probably provoked by spinal surgery, and a second episode was unprovoked. Venogram reviewed chronic stenosis from May tunnel syndrome, status post angioplasty of femoral vein stenosis  in the distal thigh and a stent placement.  1. Recurrent DVT and PE -His previous hypercoagulopathy workup was negative for prothrombin gene mutation, factor V Leyden mutation, antithrombin III, he had normal protein C and protein S activities, but he had (+) lupus anticoagulant and slightly elevated homocystine level which may contributed to his thrombosis.  -Repeated his lupus anticoagulant studies on 08/18/16; which was still positive  -I previously recommended lifelong anticoagulation if no contraindications -I reviewed his lab results from today, his CBC and CMP were unremarkable except mild elevated liver enzymes - patient wears compression socks during the day. - I again strongly encouraged him to find a PCP -Tolerating Xarelto, will continue . I refilled for him, and give him one week of free supply  2. Transaminitis -He has had mild elevated liver enzymes, overall stable. We'll continue monitoring -Hep C and Hep B labs pending; will f/u with patient.   Plan -He will continue Xarelto 20 mg daily indefinitely.  -We provided him with a 1 week sample today while he awaits his prescription from [facility].  -follow up in 6 months   All questions were answered. The patient knows to call the clinic with any problems, questions or concerns.  I spent 10 minutes counseling the patient face to face. The total time spent in the appointment was 15 minutes and more than 50% was on counseling.  This document serves as a record of services personally performed by [name], MD. It was created on her behalf by Steva Colder, a trained medical scribe. The creation of this record is based on the scribe's personal observations and the provider's statements to them. This document has been checked and approved by the attending provider.     [name], MD 02/09/2017

## 2017-02-09 ENCOUNTER — Ambulatory Visit (HOSPITAL_BASED_OUTPATIENT_CLINIC_OR_DEPARTMENT_OTHER): Payer: BLUE CROSS/BLUE SHIELD | Admitting: Hematology

## 2017-02-09 ENCOUNTER — Telehealth: Payer: Self-pay | Admitting: Hematology

## 2017-02-09 ENCOUNTER — Encounter: Payer: Self-pay | Admitting: Pharmacist

## 2017-02-09 ENCOUNTER — Encounter: Payer: Self-pay | Admitting: Hematology

## 2017-02-09 ENCOUNTER — Other Ambulatory Visit (HOSPITAL_BASED_OUTPATIENT_CLINIC_OR_DEPARTMENT_OTHER): Payer: BLUE CROSS/BLUE SHIELD

## 2017-02-09 VITALS — BP 139/98 | HR 81 | Temp 98.6°F | Resp 18 | Ht 70.0 in | Wt 219.7 lb

## 2017-02-09 DIAGNOSIS — I82532 Chronic embolism and thrombosis of left popliteal vein: Secondary | ICD-10-CM | POA: Diagnosis not present

## 2017-02-09 DIAGNOSIS — Z86718 Personal history of other venous thrombosis and embolism: Secondary | ICD-10-CM

## 2017-02-09 DIAGNOSIS — Z86711 Personal history of pulmonary embolism: Secondary | ICD-10-CM | POA: Diagnosis not present

## 2017-02-09 DIAGNOSIS — R7401 Elevation of levels of liver transaminase levels: Secondary | ICD-10-CM

## 2017-02-09 DIAGNOSIS — D6862 Lupus anticoagulant syndrome: Secondary | ICD-10-CM | POA: Diagnosis not present

## 2017-02-09 DIAGNOSIS — I825Z2 Chronic embolism and thrombosis of unspecified deep veins of left distal lower extremity: Secondary | ICD-10-CM

## 2017-02-09 DIAGNOSIS — R7989 Other specified abnormal findings of blood chemistry: Secondary | ICD-10-CM | POA: Diagnosis not present

## 2017-02-09 DIAGNOSIS — R740 Nonspecific elevation of levels of transaminase and lactic acid dehydrogenase [LDH]: Secondary | ICD-10-CM | POA: Diagnosis not present

## 2017-02-09 DIAGNOSIS — Z7901 Long term (current) use of anticoagulants: Secondary | ICD-10-CM

## 2017-02-09 DIAGNOSIS — I82402 Acute embolism and thrombosis of unspecified deep veins of left lower extremity: Secondary | ICD-10-CM

## 2017-02-09 LAB — CBC WITH DIFFERENTIAL/PLATELET
BASO%: 0.5 % (ref 0.0–2.0)
Basophils Absolute: 0 10*3/uL (ref 0.0–0.1)
EOS ABS: 0.3 10*3/uL (ref 0.0–0.5)
EOS%: 7 % (ref 0.0–7.0)
HCT: 50.3 % — ABNORMAL HIGH (ref 38.4–49.9)
HEMOGLOBIN: 16.7 g/dL (ref 13.0–17.1)
LYMPH%: 25.7 % (ref 14.0–49.0)
MCH: 30.4 pg (ref 27.2–33.4)
MCHC: 33.1 g/dL (ref 32.0–36.0)
MCV: 92 fL (ref 79.3–98.0)
MONO#: 0.6 10*3/uL (ref 0.1–0.9)
MONO%: 13.6 % (ref 0.0–14.0)
NEUT%: 53.2 % (ref 39.0–75.0)
NEUTROS ABS: 2.4 10*3/uL (ref 1.5–6.5)
Platelets: 190 10*3/uL (ref 140–400)
RBC: 5.47 10*6/uL (ref 4.20–5.82)
RDW: 14.3 % (ref 11.0–14.6)
WBC: 4.6 10*3/uL (ref 4.0–10.3)
lymph#: 1.2 10*3/uL (ref 0.9–3.3)

## 2017-02-09 LAB — COMPREHENSIVE METABOLIC PANEL
ALBUMIN: 3.7 g/dL (ref 3.5–5.0)
ALK PHOS: 52 U/L (ref 40–150)
ALT: 77 U/L — AB (ref 0–55)
AST: 51 U/L — AB (ref 5–34)
Anion Gap: 9 mEq/L (ref 3–11)
BILIRUBIN TOTAL: 0.81 mg/dL (ref 0.20–1.20)
BUN: 14.2 mg/dL (ref 7.0–26.0)
CO2: 25 meq/L (ref 22–29)
CREATININE: 1.4 mg/dL — AB (ref 0.7–1.3)
Calcium: 9.8 mg/dL (ref 8.4–10.4)
Chloride: 107 mEq/L (ref 98–109)
EGFR: 57 mL/min/{1.73_m2} — AB (ref >90)
GLUCOSE: 86 mg/dL (ref 70–140)
Potassium: 4.4 mEq/L (ref 3.5–5.1)
SODIUM: 141 meq/L (ref 136–145)
TOTAL PROTEIN: 7 g/dL (ref 6.4–8.3)

## 2017-02-09 MED ORDER — RIVAROXABAN 20 MG PO TABS: ORAL_TABLET | ORAL | 5 refills | Status: DC

## 2017-02-09 NOTE — Progress Notes (Signed)
Oral Chemotherapy Pharmacist Encounter  Dispensed samples to patient:  Medication: Xarelto 20mg  tablets Instructions: Take 1 tablet by mouth once daily with supper Quantity dispensed: 7 Days supply: 7 Manufacturer: Suszanne Finch: [id number] Exp: 02/20  Johny Drilling, PharmD, BCPS, BCOP 02/09/2017 3:13 PM [facility] [phone]

## 2017-02-09 NOTE — Telephone Encounter (Signed)
Gave patient AVS and calendar of upcoming March 2019 appointments.  °

## 2017-02-10 LAB — HEPATITIS B SURFACE ANTIGEN: HBsAg Screen: NEGATIVE

## 2017-02-10 LAB — HEPATITIS C ANTIBODY: Hep C Virus Ab: <0.1 s/co ratio (ref 0.0–0.9)

## 2017-02-11 ENCOUNTER — Encounter: Payer: Self-pay | Admitting: Hematology

## 2017-02-14 ENCOUNTER — Telehealth: Payer: Self-pay | Admitting: *Deleted

## 2017-02-14 NOTE — Telephone Encounter (Signed)
Spoke with pt and informed pt of HBV and  HCV tests were negative as per Dr. Ernestina Penna instructions.

## 2017-02-14 NOTE — Telephone Encounter (Signed)
-----   Message from [name], MD sent at 02/12/2017  8:15 PM EDT ----- Please let pt know that HBV and HCV tests were negative, thanks  [name]  02/12/2017

## 2017-08-09 NOTE — Progress Notes (Signed)
[place]  [phone] [phone]  Clinic Follow Up Note   Patient Care Team: Patient, No Pcp Per as PCP - General (General Practice)   Date of Service:  08/10/2017   CHIEF COMPLAINTS:  Recurrent PE/DVT   HISTORY OF PRESENTING ILLNESS:   Nicholas [name] 57 y.o. male is here because of recurrent PE and DVT.   He had lumbar lab in ectomy and decompression surgery in October 2015, he was able to move around without too much difficulty after surgery, but was off work and not very physically active. During his 1 months postop follow-up, he was found to be hypoxic in the office, and CT scan reviewed multiple bilateral pulmonary emboli. Ultrasound also showed right deep vein thrombosis involving the right popliteal, posterior tibia, and perineal vein. He was admitted to hospital for a few days and discharged home on Xarelto. He took it for 6 months and stopped it.  He developed worsening left leg swollen, pain, and dyspnea, 1 months after he stopped Xarelto. He was admitted to the hospital on 01/05/2015. He was found to have PE and extensive Lt leg DVT. He was started on heparin gtt. Given large clot burden in LLE he underwent IR guided mechanical thrombectomy and transcatheter thrombolytic therapy to LLE from 8/4-8/5. He tolerated this well, was tx out of ICU and started on rivaroxaban. He is ambulating without difficulty, on RA with stable vitals, no dyspnea or chest pain and much improved leg pain, and was discharged home on 01/11/2015.   He has felt much better since her hospital discharge. His left leg swollen anemia resolved, his dyspnea on exertion is much improved, he is back to walk normally. He denies any significant pain, no other symptoms.  He denies recent history of trauma, long distance travel, dehydration, recent surgery, smoking or prolonged immobilization.  He had no prior history or diagnosis of cancer. He has no family history of thrombosis.  His mother and a paternal grandmother had colon cancer. He had screening colonoscopy 6-7 years ago at the [place], was found to have couple of polyps. He is overdue 2 years for colonoscopy now. He never had PSA screening test before. He is to see his doctor at the [place], but does not want to go back. He does not have a primary care physician.  CURRENT THERAPY: Xarelto 20 mg once daily   INTERIM HISTORY:  Nicholas [name] returns for follow up of his DVT and PE. He was last seen by me 6 months ago. He presents to the clinic today noting everything is going well. He continues to rake Xarelto with no complications. He notes no concerns for bleeding. He notes he plans to have a tooth extraction. He only takes pain medication when his chronic back pain is too much. He does not have a PCP. He only sees a [place] doctor when needed. PT notes having a liver US years ago, but not sure when. Pt notes to drinking beer 3-4 times a week, 2-3 beers at a time.    On review of symptoms, pt chronic back pain and pain from arthritis.     MEDICAL HISTORY:  Past Medical History:  Diagnosis Date  . Arthritis    "my whole body"  . Chronic back pain    "mainly lower; some in top too" (03/26/2014)  . DVT (deep venous thrombosis) ([place])   . GERD (gastroesophageal reflux disease)   . History of gout   . Hypertension   . Pulmonary  embolism ([place])    Recurrent & unprovoked August 2016. Provoked/Post-op December 2015   SURGICAL HISTORY: Past Surgical History:  Procedure Laterality Date  . BACK SURGERY    . CARPAL TUNNEL RELEASE Bilateral 2012  . DECOMPRESSIVE LUMBAR LAMINECTOMY LEVEL 3 N/A 03/26/2014   Procedure: L3-S1 DECOMPRESSION;  Surgeon: Melina Schools, MD;  Location: [place] [facility];  Service: Orthopedics;  Laterality: N/A;  . INGUINAL HERNIA REPAIR Right 2000's  . LUMBAR LAMINECTOMY/DECOMPRESSION MICRODISCECTOMY  03/26/2014   L3-S1  . lytic therapy  August 2016   For Lower Extremity DVT   SOCIAL HISTORY: Social History    Socioeconomic History  . Marital status: Married    Spouse name: Not on file  . Number of children: Not on file  . Years of education: Not on file  . Highest education level: Not on file  Social Needs  . Financial resource strain: Not on file  . Food insecurity - worry: Not on file  . Food insecurity - inability: Not on file  . Transportation needs - medical: Not on file  . Transportation needs - non-medical: Not on file  Occupational History  . Occupation: Automotive  Tobacco Use  . Smoking status: Former Smoker    Packs/day: 1.50    Years: 24.00    Pack years: 36.00    Types: Cigarettes    Last attempt to quit: 06/06/1988    Years since quitting: 29.1  . Smokeless tobacco: Former Systems developer    Types: Snuff, Chew  . Tobacco comment: Quit chewing tobacco around 2000  Substance and Sexual Activity  . Alcohol use: Yes    Alcohol/week: 10.8 oz    Types: 18 Cans of beer per week    Comment: 03/26/2014 "2-3, 12oz beers/day"  . Drug use: No  . Sexual activity: Yes  Other Topics Concern  . Not on file  Social History Narrative   Originally from [place]. 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 No pets currently. No bird exposure.     FAMILY HISTORY: Family History  Problem Relation Age of Onset  . Colon cancer Mother   . Colon cancer Paternal Grandmother        colon cancer  . Lung disease Neg Hx    ALLERGIES:  is allergic to penicillins.  MEDICATIONS:  Current Outpatient Medications  Medication Sig Dispense Refill  . Colchicine 0.6 MG CAPS Take 1 capsule by mouth daily.    Marland Kitchen HYDROcodone-acetaminophen (NORCO) 10-325 MG tablet Take 0.5 tablets by mouth as needed.    Marland Kitchen losartan (COZAAR) 25 MG tablet Take 25 mg by mouth daily.    Marland Kitchen omeprazole (PRILOSEC) 20 MG capsule Take 20 mg by mouth daily.    . predniSONE (DELTASONE) 5 MG tablet Take 5 mg by mouth daily as needed. Reported on 12/23/2015    . probenecid (BENEMID) 500 MG tablet Take 500 mg  by mouth daily.     . rivaroxaban (XARELTO) 20 MG TABS tablet 20mg  po daily 30 tablet 5  . zolpidem (AMBIEN) 10 MG tablet Take 10 mg by mouth at bedtime as needed.     No current facility-administered medications for this visit.    REVIEW OF SYSTEMS:  Constitutional: Denies fevers, chills or abnormal night sweats Eyes: Denies blurriness of vision, double vision or watery eyes Ears, nose, mouth, throat, and face: Denies mucositis or sore throat Respiratory: Denies cough, dyspnea or wheezes Cardiovascular: Denies palpitation, chest discomfort or lower extremity swelling Gastrointestinal:  Denies nausea, heartburn  or change in bowel habits MSK: (+) chronic back pain, arthritis pain Skin: Denies abnormal skin rashes Lymphatics: Denies new lymphadenopathy or easy bruising Neurological:Denies numbness, tingling or new weaknesses Behavioral/Psych: Mood is stable, no new changes  All other systems were reviewed with the patient and are negative.  PHYSICAL EXAMINATION:  ECOG PERFORMANCE STATUS: 0 - Asymptomatic BP (!) 145/90 (BP Location: Right Arm, Patient Position: Sitting)   Pulse 63   Temp 97.7 F (36.5 C) (Oral)   Resp 20   Ht 5\' 10"  (1.778 m)   Wt 221 lb (100.2 kg)   SpO2 100%   BMI 31.71 kg/m  GENERAL: alert, no distress and comfortable SKIN: skin color, texture, turgor are normal, no rashes or significant lesions EYES: normal, conjunctiva are pink and non-injected, sclera clear OROPHARYNX:no exudate, no erythema and lips, buccal mucosa, and tongue normal  NECK: supple, thyroid normal size, non-tender, without nodularity LYMPH:  no palpable lymphadenopathy in the cervical, axillary or inguinal LUNGS: clear to auscultation and percussion with normal breathing effort HEART: regular rate & rhythm and no murmurs and no lower extremity edema ABDOMEN:abdomen soft, non-tender and normal bowel sounds Musculoskeletal:no cyanosis of digits and no clubbing  PSYCH: alert & oriented x 3  with fluent speech NEURO: no focal motor/sensory deficits  LABORATORY DATA:  I have reviewed the data as listed  CBC Latest Ref Rng & Units 08/10/2017 02/09/2017 08/18/2016  WBC 4.0 - 10.3 K/uL 4.6 4.6 4.3  Hemoglobin 13.0 - 17.1 g/dL 16.7 16.7 16.0  Hematocrit 38.4 - 49.9 % 50.2(H) 50.3(H) 47.0  Platelets 140 - 400 K/uL 156 190 162    CMP Latest Ref Rng & Units 08/10/2017 02/09/2017 08/18/2016  Glucose 70 - 140 mg/dL 77 86 106  BUN 7 - 26 mg/dL 12 14.2 13.7  Creatinine 0.70 - 1.30 mg/dL 1.24 1.4(H) 1.2  Sodium 136 - 145 mmol/L 143 141 140  Potassium 3.5 - 5.1 mmol/L 3.9 4.4 4.0  Chloride 98 - 109 mmol/L 105 - -  CO2 22 - 29 mmol/L 31(H) 25 25  Calcium 8.4 - 10.4 mg/dL 9.9 9.8 9.6  Total Protein 6.4 - 8.3 g/dL 7.0 7.0 6.8  Total Bilirubin 0.2 - 1.2 mg/dL 1.1 0.81 0.93  Alkaline Phos 40 - 150 U/L 49 52 51  AST 5 - 34 U/L 75(H) 51(H) 59(H)  ALT 0 - 55 U/L 110(H) 77(H) 77(H)    RADIOGRAPHIC STUDIES: I have personally reviewed the radiological images as listed and agreed with the findings in the report.  Ct Angio Chest Pe W/cm &/or Wo Cm 01/05/2015   IMPRESSION: 1. Bilateral pulmonary embolism but much more significant on the right side with a large central right-sided PE. Mild right ventricular heart strain with slight flattening of the left ventricle. The RV LV ratio is 0.92. 2. No significant pulmonary findings. 3. Normal thoracic aorta.  These results will be called to the ordering clinician or representative by the Radiologist Assistant, and communication documented in the PACS or zVision Dashboard.   Electronically Signed   By: Marijo Sanes M.D.   On: 01/05/2015 13:12   Ir Veno/ext/uni Left 01/09/2015    IMPRESSION: 1. Extensive occlusive deep venous thrombus through the left posterior tibial, popliteal and femoral veins. 2. After mechanical thrombectomy with diluted tPA, significant thrombus remain present. Thrombolytic infusion catheter was placed via posterior tibial access and infusion  of tPA begun at 1 milligram/hour. This will be continued overnight. 3. Venography demonstrates chronic appearing stenosis and interruption of the central  left common iliac vein with prominent collateral outflow into the contralateral right external iliac vein. This appears consistent with chronic stenosis from May Thurner syndrome and/or congenital interruption/duplication. This will be re-assessed after thrombolytic therapy to determine whether angioplasty and/or stent placement may be needed to improve iliac venous outflow.   Electronically Signed   By: Aletta Edouard M.D.   On: 01/09/2015 08:07   [place] Angioplasty 01/12/2015    IMPRESSION: 1. Initial venography demonstrates minimal residual nonocclusive thrombus below the knee and in the popliteal veins, duplicated popliteal veins, a high-grade stenosis in the femoral vein in the distal thigh, and evidence of May Thurner physiology with flow limiting narrowing of the central left common iliac vein. 2. Successful balloon angioplasty of femoral vein stenosis in the distal thigh to 8 mm. 3. Successful placement of a 14 mm self expanding Nitinol stent across the May Thurner lesion. 4. Successful restoration of antegrade flow from the ankle to the IVC with resolution of collateral venous filling.  PLAN: 1. Continue anticoagulation for at least 6 months. 2. Follow-up in interventional radiology clinic in 2-4 weeks with left lower extremity duplex venous ultrasound.  Signed,  Criselda Peaches, MD  [facility]  [facility]   Electronically Signed   By: Jacqulynn Cadet M.D.   On: 01/12/2015 17:26    US Venous IMG [place] Unilateral Left 03/25/15 IMPRESSION: 1. No evidence of new or acute deep venous thrombosis. 2. The left common femoral, profunda femoral and femoral veins are completely patent. Excellent respiratory phasicity suggests central patency of the stented iliac  venous segment. 3. Mild residual nonocclusive chronic DVT which has largely recanalized in the duplicated popliteal vein and upper calf veins.  ASSESSMENT: 57 y.o. male without significant past medical history, presented with recurrent extensive DVT and PE. The first episode was probably provoked by spinal surgery, and a second episode was unprovoked. 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  1. Recurrent DVT and PE -His previous hypercoagulopathy workup was negative for prothrombin gene mutation, factor V Leyden mutation, antithrombin III, he had normal protein C and protein S activities, but he had (+) lupus anticoagulant and slightly elevated homocystine level which may contributed to his thrombosis.  -Repeated his lupus anticoagulant studies on 08/18/16; which was still positive  -I recommend lifelong anticoagulation if no contraindications -Pt plans for a tooth extraction soon. I advised him to stop Xarelto 2 days before then restart the following day.  -I encouraged him to avoid falls or cuts as he is on a blood thinner.  -I reviewed his lab results from today, his CBC and CMP were unremarkable except mild elevated liver enzymes -Tolerating Xarelto, plan to continue long term unless he develops bleeding issues. I refilled for him today   -I again strongly encouraged him to find a PCP to follow him regularly. I discussed once he has a PCP I am fine to discharge him and he follow up with his PCP for management.  -I encouraged him to exercise more, watch his weight and diet. Lab and f/u in 6 months  2. Transaminitis  -He has had mild elevated liver enzymes, overall stable. We'll continue monitoring. Per pt he had liver US many years ago which showed fatty liver  -Hep C and Hep B labs from 02/2017 were negative  -AST at 75 and ALT  at 110 today (08/10/17) -I strongly encouraged him to  reduce alcohol use to see if his liver function improves.  -I recommend a [place] of liver, it is possible he has fatty liver. I also offered to refer him to a hepatologist. He would like to wait and see if his levels improve before proceeding with any workup.  -I advised pt to find a PCP to follow him regularly   3. Arthritis Pain, Chronic back pain  -On Norco as needed, managed by [facility], refilled today  -Lab and f/u in 6 months  All questions were answered. The patient knows to call the clinic with any problems, questions or concerns.  I spent 15 minutes counseling the patient face to face. The total time spent in the appointment was 20 minutes and more than 50% was on counseling.  This document serves as a record of services personally performed by [name], MD. It was created on her behalf by Joslyn Devon, a trained medical scribe. The creation of this record is based on the scribe's personal observations and the provider's statements to them.    I have reviewed the above documentation for accuracy and completeness, and I agree with the above.    [name], MD 08/10/2017 3:48 PM

## 2017-08-10 ENCOUNTER — Encounter: Payer: Self-pay | Admitting: Hematology

## 2017-08-10 ENCOUNTER — Telehealth: Payer: Self-pay | Admitting: Hematology

## 2017-08-10 ENCOUNTER — Inpatient Hospital Stay: Payer: BLUE CROSS/BLUE SHIELD | Attending: Hematology | Admitting: Hematology

## 2017-08-10 ENCOUNTER — Inpatient Hospital Stay: Payer: BLUE CROSS/BLUE SHIELD

## 2017-08-10 DIAGNOSIS — I82431 Acute embolism and thrombosis of right popliteal vein: Secondary | ICD-10-CM | POA: Insufficient documentation

## 2017-08-10 DIAGNOSIS — I82402 Acute embolism and thrombosis of unspecified deep veins of left lower extremity: Secondary | ICD-10-CM | POA: Diagnosis not present

## 2017-08-10 DIAGNOSIS — Z7901 Long term (current) use of anticoagulants: Secondary | ICD-10-CM

## 2017-08-10 DIAGNOSIS — R740 Nonspecific elevation of levels of transaminase and lactic acid dehydrogenase [LDH]: Secondary | ICD-10-CM

## 2017-08-10 DIAGNOSIS — Z87891 Personal history of nicotine dependence: Secondary | ICD-10-CM | POA: Diagnosis not present

## 2017-08-10 DIAGNOSIS — Z86711 Personal history of pulmonary embolism: Secondary | ICD-10-CM

## 2017-08-10 DIAGNOSIS — I2699 Other pulmonary embolism without acute cor pulmonale: Secondary | ICD-10-CM | POA: Insufficient documentation

## 2017-08-10 DIAGNOSIS — D6862 Lupus anticoagulant syndrome: Secondary | ICD-10-CM | POA: Insufficient documentation

## 2017-08-10 DIAGNOSIS — M109 Gout, unspecified: Secondary | ICD-10-CM | POA: Diagnosis not present

## 2017-08-10 LAB — CBC WITH DIFFERENTIAL/PLATELET
Basophils Absolute: 0 10*3/uL (ref 0.0–0.1)
Basophils Relative: 0 %
Eosinophils Absolute: 0.3 10*3/uL (ref 0.0–0.5)
Eosinophils Relative: 6 %
HEMATOCRIT: 50.2 % — AB (ref 38.4–49.9)
HEMOGLOBIN: 16.7 g/dL (ref 13.0–17.1)
LYMPHS ABS: 1.6 10*3/uL (ref 0.9–3.3)
LYMPHS PCT: 34 %
MCH: 30.9 pg (ref 27.2–33.4)
MCHC: 33.3 g/dL (ref 32.0–36.0)
MCV: 93 fL (ref 79.3–98.0)
MONOS PCT: 15 %
Monocytes Absolute: 0.7 10*3/uL (ref 0.1–0.9)
NEUTROS PCT: 45 %
Neutro Abs: 2.1 10*3/uL (ref 1.5–6.5)
Platelets: 156 10*3/uL (ref 140–400)
RBC: 5.4 MIL/uL (ref 4.20–5.82)
RDW: 15 % — ABNORMAL HIGH (ref 11.0–14.6)
WBC: 4.6 10*3/uL (ref 4.0–10.3)

## 2017-08-10 LAB — COMPREHENSIVE METABOLIC PANEL
ALK PHOS: 49 U/L (ref 40–150)
ALT: 110 U/L — AB (ref 0–55)
AST: 75 U/L — ABNORMAL HIGH (ref 5–34)
Albumin: 3.8 g/dL (ref 3.5–5.0)
Anion gap: 7 (ref 3–11)
BILIRUBIN TOTAL: 1.1 mg/dL (ref 0.2–1.2)
BUN: 12 mg/dL (ref 7–26)
CALCIUM: 9.9 mg/dL (ref 8.4–10.4)
CO2: 31 mmol/L — AB (ref 22–29)
CREATININE: 1.24 mg/dL (ref 0.70–1.30)
Chloride: 105 mmol/L (ref 98–109)
Glucose, Bld: 77 mg/dL (ref 70–140)
Potassium: 3.9 mmol/L (ref 3.5–5.1)
Sodium: 143 mmol/L (ref 136–145)
TOTAL PROTEIN: 7 g/dL (ref 6.4–8.3)

## 2017-08-10 MED ORDER — RIVAROXABAN 20 MG PO TABS: ORAL_TABLET | ORAL | 5 refills | Status: DC

## 2017-08-10 NOTE — Telephone Encounter (Signed)
Gave patient avs and calendar with appts per 3/7 los.  °

## 2018-02-08 ENCOUNTER — Inpatient Hospital Stay: Payer: BLUE CROSS/BLUE SHIELD

## 2018-02-08 ENCOUNTER — Inpatient Hospital Stay: Payer: BLUE CROSS/BLUE SHIELD | Attending: Hematology | Admitting: Hematology

## 2018-02-09 ENCOUNTER — Telehealth: Payer: Self-pay | Admitting: Hematology

## 2018-02-09 NOTE — Telephone Encounter (Signed)
Tried to call regarding 10/10 I did leave a message

## 2018-03-14 NOTE — Progress Notes (Signed)
[place]  [phone] [phone]  Clinic Follow Up Note   Patient Care Team: Patient, No Pcp Per as PCP - General (General Practice)   Date of Service:  03/15/2018   CHIEF COMPLAINTS:  Recurrent PE/DVT   HISTORY OF PRESENTING ILLNESS:   Nicholas [name] 57 y.o. male is here because of recurrent PE and DVT.   He had lumbar lab in ectomy and decompression surgery in October 2015, he was able to move around without too much difficulty after surgery, but was off work and not very physically active. During his 1 months postop follow-up, he was found to be hypoxic in the office, and CT scan reviewed multiple bilateral pulmonary emboli. Ultrasound also showed right deep vein thrombosis involving the right popliteal, posterior tibia, and perineal vein. He was admitted to hospital for a few days and discharged home on Xarelto. He took it for 6 months and stopped it.  He developed worsening left leg swollen, pain, and dyspnea, 1 months after he stopped Xarelto. He was admitted to the hospital on 01/05/2015. He was found to have PE and extensive Lt leg DVT. He was started on heparin gtt. Given large clot burden in LLE he underwent IR guided mechanical thrombectomy and transcatheter thrombolytic therapy to LLE from 8/4-8/5. He tolerated this well, was tx out of ICU and started on rivaroxaban. He is ambulating without difficulty, on RA with stable vitals, no dyspnea or chest pain and much improved leg pain, and was discharged home on 01/11/2015.   He has felt much better since her hospital discharge. His left leg swollen anemia resolved, his dyspnea on exertion is much improved, he is back to walk normally. He denies any significant pain, no other symptoms.  He denies recent history of trauma, long distance travel, dehydration, recent surgery, smoking or prolonged immobilization.  He had no prior history or diagnosis of cancer. He has no family history of thrombosis.  His mother and a paternal grandmother had colon cancer. He had screening colonoscopy 6-7 years ago at the [place], was found to have couple of polyps. He is overdue 2 years for colonoscopy now. He never had PSA screening test before. He is to see his doctor at the [place], but does not want to go back. He does not have a primary care physician.  CURRENT THERAPY: Xarelto 20 mg once daily   INTERIM HISTORY:  [name] returns for follow up of his DVT and PE. He was last seen by me 6 months ago. Today, he is here alone at the clinic. He is doing well. His BP is high and he relates that to not taking his medication on time. He states that he doesn't exercise often. He states that he smoked in the past, but quit long time ago. He snored at night, but have not checked for OSA. He doesn't use steroid/testosterone supplements.   MEDICAL HISTORY:  Past Medical History:  Diagnosis Date  . Arthritis    "my whole body"  . Chronic back pain    "mainly lower; some in top too" (03/26/2014)  . DVT (deep venous thrombosis) ([place])   . GERD (gastroesophageal reflux disease)   . History of gout   . Hypertension   . Pulmonary embolism ([place])    Recurrent & unprovoked August 2016. Provoked/Post-op December 2015   SURGICAL HISTORY: Past Surgical History:  Procedure Laterality Date  . BACK SURGERY    . CARPAL TUNNEL RELEASE Bilateral 2012  . DECOMPRESSIVE LUMBAR LAMINECTOMY  LEVEL 3 N/A 03/26/2014   Procedure: L3-S1 DECOMPRESSION;  Surgeon: Melina Schools, MD;  Location: [facility];  Service: Orthopedics;  Laterality: N/A;  . INGUINAL HERNIA REPAIR Right 2000's  . LUMBAR LAMINECTOMY/DECOMPRESSION MICRODISCECTOMY  03/26/2014   L3-S1  . lytic therapy  August 2016   For Lower Extremity DVT   SOCIAL HISTORY: Social History   Socioeconomic History  . Marital status: Married    Spouse name: Not on file  . Number of children: Not on file  . Years of education: Not on file  . Highest education level: Not on file    Occupational History  . Occupation: Lexicographer  . Financial resource strain: Not on file  . Food insecurity:    Worry: Not on file    Inability: Not on file  . Transportation needs:    Medical: Not on file    Non-medical: Not on file  Tobacco Use  . Smoking status: Former Smoker    Packs/day: 1.50    Years: 24.00    Pack years: 36.00    Types: Cigarettes    Last attempt to quit: 06/06/1988    Years since quitting: 29.7  . Smokeless tobacco: Former Systems developer    Types: Snuff, Chew  . Tobacco comment: Quit chewing tobacco around 2000  Substance and Sexual Activity  . Alcohol use: Yes    Alcohol/week: 18.0 standard drinks    Types: 18 Cans of beer per week    Comment: 03/26/2014 "2-3, 12oz beers/day"  . Drug use: No  . Sexual activity: Yes  Lifestyle  . Physical activity:    Days per week: Not on file    Minutes per session: Not on file  . Stress: Not on file  Relationships  . Social connections:    Talks on phone: Not on file    Gets together: Not on file    Attends religious service: Not on file    Active member of club or organization: Not on file    Attends meetings of clubs or organizations: Not on file    Relationship status: Not on file  . Intimate partner violence:    Fear of current or ex partner: Not on file    Emotionally abused: Not on file    Physically abused: Not on file    Forced sexual activity: Not on file  Other Topics Concern  . Not on file  Social History Narrative   Originally from [place]. 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 No pets currently. No bird exposure.     FAMILY HISTORY: Family History  Problem Relation Age of Onset  . Colon cancer Mother   . Colon cancer Paternal Grandmother        colon cancer  . Lung disease Neg Hx    ALLERGIES:  is allergic to penicillins.  MEDICATIONS:  Current Outpatient Medications  Medication Sig Dispense Refill  . Colchicine 0.6 MG CAPS Take 1 capsule  by mouth daily.    Marland Kitchen HYDROcodone-acetaminophen (NORCO) 10-325 MG tablet Take 0.5 tablets by mouth as needed.    Marland Kitchen losartan (COZAAR) 25 MG tablet Take 25 mg by mouth daily.    Marland Kitchen omeprazole (PRILOSEC) 20 MG capsule Take 20 mg by mouth daily.    . predniSONE (DELTASONE) 5 MG tablet Take 5 mg by mouth daily as needed. Reported on 12/23/2015    . probenecid (BENEMID) 500 MG tablet Take 500 mg by mouth daily.     Marland Kitchen  rivaroxaban (XARELTO) 20 MG TABS tablet 20mg  po daily 30 tablet 11  . zolpidem (AMBIEN) 10 MG tablet Take 10 mg by mouth at bedtime as needed.     No current facility-administered medications for this visit.    REVIEW OF SYSTEMS:  Constitutional: Denies fevers, chills or abnormal night sweats Eyes: Denies blurriness of vision, double vision or watery eyes Ears, nose, mouth, throat, and face: Denies mucositis or sore throat Respiratory: Denies cough, dyspnea or wheezes Cardiovascular: Denies palpitation, chest discomfort or lower extremity swelling Gastrointestinal:  Denies nausea, heartburn or change in bowel habits MSK: No new joint pain or myalgia  Skin: Denies abnormal skin rashes Lymphatics: Denies new lymphadenopathy or easy bruising Neurological:Denies numbness, tingling or new weaknesses Behavioral/Psych: Mood is stable, no new changes  All other systems were reviewed with the patient and are negative.  PHYSICAL EXAMINATION:  ECOG PERFORMANCE STATUS: 0 - Asymptomatic BP (!) 146/104 (BP Location: Right Arm, Patient Position: Sitting) Comment: Engineering geologist is aware  Pulse 80   Temp 97.7 F (36.5 C) (Oral)   Resp 17   Ht 5\' 10"  (1.778 m)   Wt 223 lb 3.2 oz (101.2 kg)   SpO2 95%   BMI 32.03 kg/m  GENERAL: alert, no distress and comfortable SKIN: skin color, texture, turgor are normal, no rashes or significant lesions EYES: normal, conjunctiva are pink and non-injected, sclera clear OROPHARYNX:no exudate, no erythema and lips, buccal mucosa, and tongue normal  NECK:  supple, thyroid normal size, non-tender, without nodularity LYMPH:  no palpable lymphadenopathy in the cervical, axillary or inguinal LUNGS: clear to auscultation and percussion with normal breathing effort HEART: regular rate & rhythm and no murmurs and no lower extremity edema ABDOMEN:abdomen soft, non-tender and normal bowel sounds Musculoskeletal:no cyanosis of digits and no clubbing  PSYCH: alert & oriented x 3 with fluent speech NEURO: no focal motor/sensory deficits  LABORATORY DATA:  I have reviewed the data as listed  CBC Latest Ref Rng & Units 03/15/2018 08/10/2017 02/09/2017  WBC 4.0 - 10.5 K/uL 6.0 4.6 4.6  Hemoglobin 13.0 - 17.0 g/dL 16.1 16.7 16.7  Hematocrit 39.0 - 52.0 % 48.8 50.2(H) 50.3(H)  Platelets 150 - 400 K/uL 184 156 190    CMP Latest Ref Rng & Units 03/15/2018 08/10/2017 02/09/2017  Glucose 70 - 99 mg/dL 91 77 86  BUN 6 - 20 mg/dL 12 12 14.2  Creatinine 0.61 - 1.24 mg/dL 1.27(H) 1.24 1.4(H)  Sodium 135 - 145 mmol/L 143 143 141  Potassium 3.5 - 5.1 mmol/L 3.9 3.9 4.4  Chloride 98 - 111 mmol/L 106 105 -  CO2 22 - 32 mmol/L 27 31(H) 25  Calcium 8.9 - 10.3 mg/dL 9.6 9.9 9.8  Total Protein 6.5 - 8.1 g/dL 7.2 7.0 7.0  Total Bilirubin 0.3 - 1.2 mg/dL 1.2 1.1 0.81  Alkaline Phos 38 - 126 U/L 49 49 52  AST 15 - 41 U/L 90(H) 75(H) 51(H)  ALT 0 - 44 U/L 133(H) 110(H) 77(H)    RADIOGRAPHIC STUDIES: I have personally reviewed the radiological images as listed and agreed with the findings in the report.  Ct Angio Chest Pe W/cm &/or Wo Cm 01/05/2015   IMPRESSION: 1. Bilateral pulmonary embolism but much more significant on the right side with a large central right-sided PE. Mild right ventricular heart strain with slight flattening of the left ventricle. The RV LV ratio is 0.92. 2. No significant pulmonary findings. 3. Normal thoracic aorta.  These results will be called to the ordering  clinician or representative by the Radiologist Assistant, and communication documented in  the PACS or zVision Dashboard.   Electronically Signed   By: Marijo Sanes M.D.   On: 01/05/2015 13:12   Ir Veno/ext/uni Left 01/09/2015    IMPRESSION: 1. Extensive occlusive deep venous thrombus through the left posterior tibial, popliteal and femoral veins. 2. After mechanical thrombectomy with diluted tPA, significant thrombus remain present. Thrombolytic infusion catheter was placed via posterior tibial access and infusion of tPA begun at 1 milligram/hour. This will be continued overnight. 3. Venography demonstrates chronic appearing stenosis and interruption of the central left common iliac vein with prominent collateral outflow into the contralateral right external iliac vein. This appears consistent with chronic stenosis from May Thurner syndrome and/or congenital interruption/duplication. This will be re-assessed after thrombolytic therapy to determine whether angioplasty and/or stent placement may be needed to improve iliac venous outflow.   Electronically Signed   By: Aletta Edouard M.D.   On: 01/09/2015 08:07   [place] Angioplasty 01/12/2015    IMPRESSION: 1. Initial venography demonstrates minimal residual nonocclusive thrombus below the knee and in the popliteal veins, duplicated popliteal veins, a high-grade stenosis in the femoral vein in the distal thigh, and evidence of May Thurner physiology with flow limiting narrowing of the central left common iliac vein. 2. Successful balloon angioplasty of femoral vein stenosis in the distal thigh to 8 mm. 3. Successful placement of a 14 mm self expanding Nitinol stent across the May Thurner lesion. 4. Successful restoration of antegrade flow from the ankle to the IVC with resolution of collateral venous filling.  PLAN: 1. Continue anticoagulation for at least 6 months. 2. Follow-up in interventional radiology clinic in 2-4 weeks with left lower extremity duplex venous ultrasound.  Signed,  Criselda Peaches, MD  [facility]  [facility]   Electronically Signed   By: Jacqulynn Cadet M.D.   On: 01/12/2015 17:26    US Venous IMG [place] Unilateral Left 03/25/15 IMPRESSION: 1. No evidence of new or acute deep venous thrombosis. 2. The left common femoral, profunda femoral and femoral veins are completely patent. Excellent respiratory phasicity suggests central patency of the stented iliac venous segment. 3. Mild residual nonocclusive chronic DVT which has largely recanalized in the duplicated popliteal vein and upper calf veins.  ASSESSMENT:  57 y.o. male without significant past medical history, presented with recurrent extensive DVT and PE. The first episode was probably provoked by spinal surgery, and a second episode was unprovoked. 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  1. Recurrent DVT and PE, lupus anticoagulant (+)  -His previous hypercoagulopathy workup was negative for prothrombin gene mutation, factor V Leyden mutation, antithrombin III, he had normal protein C and protein S activities, but he had (+) lupus anticoagulant and slightly elevated homocystine level which may contributed to his thrombosis.  -Repeated his lupus anticoagulant studies on 08/18/16; which was still positive  -I recommend lifelong anticoagulation if no contraindications -Pt plans for a tooth extraction soon. I advised him to stop Xarelto 2 days before then restart the following day.  -I encouraged him to avoid falls or cuts as he is on a blood thinner.  -I reviewed his lab results from today, his CBC and CMP were unremarkable except mild elevated liver enzymes. His Cr was 1.27. -Tolerating Xarelto, plan to continue long term  unless he develops bleeding issues. [facility], so he doesn't have to see me if he wishes.  -I again strongly encouraged him  to find a PCP to follow him regularly. I discussed once he has a PCP I am fine to discharge him and he follow up with his PCP for management.  -I encouraged him to exercise more, watch his weight and diet. I also advised him to wear compression stocking. He agreed. -I discussed his previously elevated HCT with him and recommended a sleep study to r/u OSA as he reported snoring. He quit smoking long time ago and denies using steroid/testosterone supplements.  -I educated him about bleeding risks with chronic use of Xarelto  -Lab and f/u in one year   2. Transaminitis  -He has had mild elevated liver enzymes, overall stable. We'll continue monitoring. Per pt he had liver US many years ago which showed fatty liver  -Hep C and Hep B labs from 02/2017 were negative  -I strongly encouraged him to reduce alcohol use to see if his liver function improves.  -I recommend a [place] of liver, it is possible he has fatty liver. I also offered to refer him to a hepatologist. He would like to wait and see if his levels improve before proceeding with any workup.  -I advised pt to find a PCP to follow him regularly  -AST at 90 and ALT 133 today  3. Arthritis Pain, Chronic back pain  -On Norco as needed, managed by [facility]  4. Mild intermittent polycythemia -His routine CBC in September 2018 and March 2019 showed a hematocrit 50%, previously was in 40-47% range -Repeated CBC today showed hematocrit 48.8%, hemglobin 16.1. -He has a remote smoking history, snores at night, I recommend him to have a sleep study.  He will think about it -I do not think he needs further work up at this point    [place], refilled today  -Lab and f/u in one year -flu shot today   All questions were answered. The patient knows to call the clinic with any problems, questions or concerns.  I spent 20 minutes counseling the patient face to face. The total time spent in the appointment was 25 minutes and more than 50%  was on counseling.  Dierdre Searles Dweik am acting as scribe for Dr. [name].  I have reviewed the above documentation for accuracy and completeness, and I agree with the above.      [name], MD 03/15/2018

## 2018-03-15 ENCOUNTER — Inpatient Hospital Stay: Payer: BLUE CROSS/BLUE SHIELD | Attending: Hematology

## 2018-03-15 ENCOUNTER — Encounter: Payer: Self-pay | Admitting: Hematology

## 2018-03-15 ENCOUNTER — Inpatient Hospital Stay (HOSPITAL_BASED_OUTPATIENT_CLINIC_OR_DEPARTMENT_OTHER): Payer: BLUE CROSS/BLUE SHIELD | Admitting: Hematology

## 2018-03-15 VITALS — BP 146/104 | HR 80 | Temp 97.7°F | Resp 17 | Ht 70.0 in | Wt 223.2 lb

## 2018-03-15 DIAGNOSIS — Z86711 Personal history of pulmonary embolism: Secondary | ICD-10-CM

## 2018-03-15 DIAGNOSIS — G8929 Other chronic pain: Secondary | ICD-10-CM | POA: Diagnosis not present

## 2018-03-15 DIAGNOSIS — Z87891 Personal history of nicotine dependence: Secondary | ICD-10-CM | POA: Insufficient documentation

## 2018-03-15 DIAGNOSIS — Z23 Encounter for immunization: Secondary | ICD-10-CM

## 2018-03-15 DIAGNOSIS — Z7901 Long term (current) use of anticoagulants: Secondary | ICD-10-CM

## 2018-03-15 DIAGNOSIS — I10 Essential (primary) hypertension: Secondary | ICD-10-CM | POA: Diagnosis not present

## 2018-03-15 DIAGNOSIS — D751 Secondary polycythemia: Secondary | ICD-10-CM

## 2018-03-15 DIAGNOSIS — I82402 Acute embolism and thrombosis of unspecified deep veins of left lower extremity: Secondary | ICD-10-CM | POA: Diagnosis not present

## 2018-03-15 DIAGNOSIS — Z79899 Other long term (current) drug therapy: Secondary | ICD-10-CM | POA: Diagnosis not present

## 2018-03-15 DIAGNOSIS — I2699 Other pulmonary embolism without acute cor pulmonale: Secondary | ICD-10-CM | POA: Insufficient documentation

## 2018-03-15 LAB — CBC WITH DIFFERENTIAL/PLATELET
Abs Immature Granulocytes: 0.01 10*3/uL (ref 0.00–0.07)
BASOS PCT: 0 %
Basophils Absolute: 0 10*3/uL (ref 0.0–0.1)
Eosinophils Absolute: 0.4 10*3/uL (ref 0.0–0.5)
Eosinophils Relative: 7 %
HCT: 48.8 % (ref 39.0–52.0)
Hemoglobin: 16.1 g/dL (ref 13.0–17.0)
IMMATURE GRANULOCYTES: 0 %
Lymphocytes Relative: 21 %
Lymphs Abs: 1.3 10*3/uL (ref 0.7–4.0)
MCH: 29.9 pg (ref 26.0–34.0)
MCHC: 33 g/dL (ref 30.0–36.0)
MCV: 90.5 fL (ref 80.0–100.0)
Monocytes Absolute: 0.6 10*3/uL (ref 0.1–1.0)
Monocytes Relative: 10 %
NEUTROS ABS: 3.6 10*3/uL (ref 1.7–7.7)
NEUTROS PCT: 62 %
PLATELETS: 184 10*3/uL (ref 150–400)
RBC: 5.39 MIL/uL (ref 4.22–5.81)
RDW: 14.4 % (ref 11.5–15.5)
WBC: 6 10*3/uL (ref 4.0–10.5)
nRBC: 0 % (ref 0.0–0.2)

## 2018-03-15 LAB — COMPREHENSIVE METABOLIC PANEL
ALBUMIN: 4 g/dL (ref 3.5–5.0)
ALT: 133 U/L — ABNORMAL HIGH (ref 0–44)
ANION GAP: 10 (ref 5–15)
AST: 90 U/L — ABNORMAL HIGH (ref 15–41)
Alkaline Phosphatase: 49 U/L (ref 38–126)
BUN: 12 mg/dL (ref 6–20)
CHLORIDE: 106 mmol/L (ref 98–111)
CO2: 27 mmol/L (ref 22–32)
Calcium: 9.6 mg/dL (ref 8.9–10.3)
Creatinine, Ser: 1.27 mg/dL — ABNORMAL HIGH (ref 0.61–1.24)
GFR calc Af Amer: >60 mL/min (ref >60)
GLUCOSE: 91 mg/dL (ref 70–99)
POTASSIUM: 3.9 mmol/L (ref 3.5–5.1)
SODIUM: 143 mmol/L (ref 135–145)
TOTAL PROTEIN: 7.2 g/dL (ref 6.5–8.1)
Total Bilirubin: 1.2 mg/dL (ref 0.3–1.2)

## 2018-03-15 MED ORDER — RIVAROXABAN 20 MG PO TABS: ORAL_TABLET | ORAL | 11 refills | Status: DC

## 2018-03-15 MED ORDER — INFLUENZA VAC SPLIT QUAD 0.5 ML IM SUSY
PREFILLED_SYRINGE | INTRAMUSCULAR | Status: AC
  Filled 2018-03-15: qty 0.5

## 2018-03-15 MED ORDER — INFLUENZA VAC SPLIT QUAD 0.5 ML IM SUSY
0.5000 mL | PREFILLED_SYRINGE | Freq: Once | INTRAMUSCULAR | Status: AC
  Administered 2018-03-15: 0.5 mL via INTRAMUSCULAR

## 2018-04-05 DIAGNOSIS — M549 Dorsalgia, unspecified: Secondary | ICD-10-CM | POA: Diagnosis not present

## 2018-04-05 DIAGNOSIS — G8929 Other chronic pain: Secondary | ICD-10-CM | POA: Diagnosis not present

## 2018-04-05 DIAGNOSIS — K219 Gastro-esophageal reflux disease without esophagitis: Secondary | ICD-10-CM | POA: Diagnosis not present

## 2018-04-05 DIAGNOSIS — G479 Sleep disorder, unspecified: Secondary | ICD-10-CM | POA: Diagnosis not present

## 2018-10-04 DIAGNOSIS — J309 Allergic rhinitis, unspecified: Secondary | ICD-10-CM | POA: Diagnosis not present

## 2018-10-04 DIAGNOSIS — M549 Dorsalgia, unspecified: Secondary | ICD-10-CM | POA: Diagnosis not present

## 2018-10-04 DIAGNOSIS — K219 Gastro-esophageal reflux disease without esophagitis: Secondary | ICD-10-CM | POA: Diagnosis not present

## 2018-10-04 DIAGNOSIS — G8929 Other chronic pain: Secondary | ICD-10-CM | POA: Diagnosis not present

## 2018-12-18 ENCOUNTER — Encounter: Payer: Self-pay | Admitting: Gastroenterology

## 2019-03-13 ENCOUNTER — Telehealth: Payer: Self-pay | Admitting: Hematology

## 2019-03-13 NOTE — Telephone Encounter (Signed)
I talk with patient regarding reschedule °

## 2019-03-14 ENCOUNTER — Other Ambulatory Visit: Payer: BLUE CROSS/BLUE SHIELD

## 2019-03-14 ENCOUNTER — Ambulatory Visit: Payer: BLUE CROSS/BLUE SHIELD | Admitting: Hematology

## 2019-03-19 NOTE — Progress Notes (Signed)
[place] [facility]   Telephone:(336) [phone] Fax:(336) [phone]   Clinic Follow up Note   Patient Care Team: Patient, No Pcp Per as PCP - General (General Practice) 03/20/2019  CHIEF COMPLAINT: Recurrent PE/DVT  CURRENT THERAPY: Xarelto 20 mg once daily   INTERVAL HISTORY: Mr. Nicholas [name] returns for annual f/u as scheduled. He was last seen 03/2018. He continues Xarelto 20 mg daily. He feels good. Denies changes in his health over the last year. He continues Xarelto. He bleeds more when he gets cut but is able to stop. Denies GI bleeding or epistaxis. Denies GI upset. Weight and appetite are normal for him. He is exercising less. Denies smoking or steroids/testosterone. He wears compression stockings, denies new leg swelling or pain. No recent cough, chest pain, dyspnea. He does not have a PCP. He is asking for PSA to be done today. Over the year he developed urinary symptoms such as frequency and nocturia (usually once). Denies dysuria or hematuria.     MEDICAL HISTORY:  Past Medical History:  Diagnosis Date  . Arthritis    "my whole body"  . Chronic back pain    "mainly lower; some in top too" (03/26/2014)  . DVT (deep venous thrombosis) ([place])   . GERD (gastroesophageal reflux disease)   . History of gout   . Hypertension   . Pulmonary embolism ([place])    Recurrent & unprovoked August 2016. Provoked/Post-op December 2015    SURGICAL HISTORY: Past Surgical History:  Procedure Laterality Date  . BACK SURGERY    . CARPAL TUNNEL RELEASE Bilateral 2012  . DECOMPRESSIVE LUMBAR LAMINECTOMY LEVEL 3 N/A 03/26/2014   Procedure: L3-S1 DECOMPRESSION;  Surgeon: Melina Schools, MD;  Location: [facility];  Service: Orthopedics;  Laterality: N/A;  . INGUINAL HERNIA REPAIR Right 2000's  . LUMBAR LAMINECTOMY/DECOMPRESSION MICRODISCECTOMY  03/26/2014   L3-S1  . lytic therapy  August 2016   For Lower Extremity DVT    I have reviewed the social history and family history with the patient and  they are unchanged from previous note.  ALLERGIES:  is allergic to penicillins.  MEDICATIONS:  Current Outpatient Medications  Medication Sig Dispense Refill  . Colchicine 0.6 MG CAPS Take 1 capsule by mouth daily.    Marland Kitchen gabapentin (NEURONTIN) 100 MG capsule Take 100 mg by mouth 3 (three) times daily.    Marland Kitchen losartan (COZAAR) 25 MG tablet Take 25 mg by mouth daily.    Marland Kitchen omeprazole (PRILOSEC) 20 MG capsule Take 20 mg by mouth daily.    . predniSONE (DELTASONE) 5 MG tablet Take 5 mg by mouth daily as needed. Reported on 12/23/2015    . probenecid (BENEMID) 500 MG tablet Take 500 mg by mouth daily.     . rivaroxaban (XARELTO) 20 MG TABS tablet 20mg  po daily 30 tablet 11  . HYDROcodone-acetaminophen (NORCO) 10-325 MG tablet Take 0.5 tablets by mouth as needed.    . zolpidem (AMBIEN) 10 MG tablet Take 10 mg by mouth at bedtime as needed.     No current facility-administered medications for this visit.     PHYSICAL EXAMINATION: ECOG PERFORMANCE STATUS: 0 - Asymptomatic  Vitals:   03/20/19 1529  BP: (!) 135/91  Pulse: 78  Resp: 18  Temp: 98.5 F (36.9 C)  SpO2: 98%   Filed Weights   03/20/19 1529  Weight: 234 lb 1.6 oz (106.2 kg)    GENERAL:alert, no distress and comfortable SKIN: no rash  EYES: sclera clear LYMPH:  no palpable cervical or supraclavicular  lymphadenopathy  LUNGS: clear with normal breathing effort HEART: regular rate & rhythm, no lower extremity edema ABDOMEN:abdomen soft, non-tender and normal bowel sounds. No hepatosplenomegaly  Musculoskeletal:no cyanosis of digits  NEURO: alert & oriented x 3 with fluent speech, normal gait   LABORATORY DATA:  I have reviewed the data as listed CBC Latest Ref Rng & Units 03/20/2019 03/15/2018 08/10/2017  WBC 4.0 - 10.5 K/uL 4.0 6.0 4.6  Hemoglobin 13.0 - 17.0 g/dL 15.8 16.1 16.7  Hematocrit 39.0 - 52.0 % 47.9 48.8 50.2(H)  Platelets 150 - 400 K/uL 147(L) 184 156     CMP Latest Ref Rng & Units 03/20/2019 03/15/2018  08/10/2017  Glucose 70 - 99 mg/dL 96 91 77  BUN 6 - 20 mg/dL 14 12 12   Creatinine 0.61 - 1.24 mg/dL 1.38(H) 1.27(H) 1.24  Sodium 135 - 145 mmol/L 142 143 143  Potassium 3.5 - 5.1 mmol/L 4.8 3.9 3.9  Chloride 98 - 111 mmol/L 104 106 105  CO2 22 - 32 mmol/L 29 27 31(H)  Calcium 8.9 - 10.3 mg/dL 9.5 9.6 9.9  Total Protein 6.5 - 8.1 g/dL 6.5 7.2 7.0  Total Bilirubin 0.3 - 1.2 mg/dL 0.9 1.2 1.1  Alkaline Phos 38 - 126 U/L 42 49 49  AST 15 - 41 U/L 74(H) 90(H) 75(H)  ALT 0 - 44 U/L 98(H) 133(H) 110(H)      RADIOGRAPHIC STUDIES: I have personally reviewed the radiological images as listed and agreed with the findings in the report. No results found.   ASSESSMENT & PLAN: 58 y.o. male without significant past medical history, presented with recurrent extensive DVT and PE. The first episode was probably provoked by spinal surgery, and a second episode was unprovoked. 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  1. Recurrent DVT and PE, lupus anticoagulant (+)  -His previous hypercoagulopathy workup was negative for prothrombin gene mutation, factor V Leyden mutation, antithrombin III, he had normal protein C and protein S activities, but he had (+) lupus anticoagulant and slightly elevated homocystine level which may contributed to his thrombosis.  -Repeated his lupus anticoagulant studies on 08/18/16; which was still positive  -I again recommended lifelong anticoagulation as long as he does not develop contraindications -[name] appears stable. He tolerates Xarelto well without significant bleeding -he wears compression stockings. He is a nonsmoker -I encouraged him to increase exercise and remain active.  -Labs reviewed, CBC normal except plt 147K. CMP with elevated Cr 1.38 and transaminitis that has improved since last year. AST 74, ALT 98 -I recommend for him to avoid NSAIDs and  nephrotoxic agents. Renal dysfunction could be related to Xarelto. Will monitor.  -I refilled Xarelto for him -lab in 6 months, f/u in 1 year  2. Transaminitis  -He has had mild elevated liver enzymes, overall stable. We'll continue monitoring. Per pt he had liver US many years ago which showed fatty liver  -Hep C and Hep B labs from 02/2017 were negative  -He had a CTA of the chest in 01/2015 that showed numerous hepatic cysts in the upper abdomen -He drinks alcohol usually daily, I encouraged him to reduce alcohol intake -exam in benign -I recommend abdominal US to investigate transaminitis, and new mild thrombocytopenia to see if this is all related to liver disease -He declined work up for now and prefers to repeat labs -return for lab in 6 months. He will call me  if he develops RUQ pain   3. Arthritis Pain, Chronic back pain  -On Norco as needed, managed by [facility] -stable   4. Mild intermittent polycythemia -His routine CBC in September 2018 and March 2019 showed a hematocrit 50%, previously was in 40-47% range -HCT was normal in 03/2018 and again today -He has a remote smoking history, snores at night, Dr. Burr Medico previously recommended him to have a sleep study but has not done so it -He agrees to PCP referral for routine health maintenance such as sleep study, BP management  5. Health maintenance -He is reportedly due for colonoscopy, he has it done q3 years per [facility] -he will call to make an appointment.  -He developed urinary symptoms this year and is requesting to have PSA checked today, will do so and f/u with the report  PLAN: -Labs reviewed -Continue Xarelto 20 mg daily indefinitely; I refilled today -PSA pending -Referral to PCP -lab in 6 months to monitor liver and kidney function; if LFTs worsen I would again recommend abdominal US -F/u in 1 year   No problem-specific Assessment & Plan notes found for this encounter.   Orders Placed This Encounter   Procedures  . PSA, total and free    Standing Status:   Future    Standing Expiration Date:   03/19/2020  . Ambulatory referral to Internal Medicine    Referral Priority:   Routine    Referral Type:   Consultation    Referral Reason:   Specialty Services Required    Requested Specialty:   Internal Medicine    Number of Visits Requested:   1   All questions were answered. The patient knows to call the clinic with any problems, questions or concerns. No barriers to learning was detected. I spent 20 minutes counseling the patient face to face. The total time spent in the appointment was 25 minutes and more than 50% was on counseling and review of test results     Alla Feeling, NP 03/20/19

## 2019-03-20 ENCOUNTER — Inpatient Hospital Stay: Payer: BC Managed Care – PPO | Admitting: Nurse Practitioner

## 2019-03-20 ENCOUNTER — Other Ambulatory Visit: Payer: Self-pay

## 2019-03-20 ENCOUNTER — Inpatient Hospital Stay: Payer: BC Managed Care – PPO | Attending: Hematology

## 2019-03-20 ENCOUNTER — Encounter: Payer: Self-pay | Admitting: Nurse Practitioner

## 2019-03-20 VITALS — BP 135/91 | HR 78 | Temp 98.5°F | Resp 18 | Ht 70.0 in | Wt 234.1 lb

## 2019-03-20 DIAGNOSIS — Z86711 Personal history of pulmonary embolism: Secondary | ICD-10-CM

## 2019-03-20 DIAGNOSIS — D6862 Lupus anticoagulant syndrome: Secondary | ICD-10-CM | POA: Insufficient documentation

## 2019-03-20 DIAGNOSIS — Z7901 Long term (current) use of anticoagulants: Secondary | ICD-10-CM | POA: Diagnosis not present

## 2019-03-20 DIAGNOSIS — G8929 Other chronic pain: Secondary | ICD-10-CM | POA: Diagnosis not present

## 2019-03-20 DIAGNOSIS — I82402 Acute embolism and thrombosis of unspecified deep veins of left lower extremity: Secondary | ICD-10-CM

## 2019-03-20 DIAGNOSIS — M1990 Unspecified osteoarthritis, unspecified site: Secondary | ICD-10-CM | POA: Insufficient documentation

## 2019-03-20 DIAGNOSIS — Z125 Encounter for screening for malignant neoplasm of prostate: Secondary | ICD-10-CM

## 2019-03-20 DIAGNOSIS — Z79899 Other long term (current) drug therapy: Secondary | ICD-10-CM | POA: Insufficient documentation

## 2019-03-20 DIAGNOSIS — I10 Essential (primary) hypertension: Secondary | ICD-10-CM | POA: Insufficient documentation

## 2019-03-20 DIAGNOSIS — K219 Gastro-esophageal reflux disease without esophagitis: Secondary | ICD-10-CM | POA: Diagnosis not present

## 2019-03-20 DIAGNOSIS — Z86718 Personal history of other venous thrombosis and embolism: Secondary | ICD-10-CM | POA: Diagnosis not present

## 2019-03-20 DIAGNOSIS — M549 Dorsalgia, unspecified: Secondary | ICD-10-CM | POA: Diagnosis not present

## 2019-03-20 LAB — COMPREHENSIVE METABOLIC PANEL
ALT: 98 U/L — ABNORMAL HIGH (ref 0–44)
AST: 74 U/L — ABNORMAL HIGH (ref 15–41)
Albumin: 3.8 g/dL (ref 3.5–5.0)
Alkaline Phosphatase: 42 U/L (ref 38–126)
Anion gap: 9 (ref 5–15)
BUN: 14 mg/dL (ref 6–20)
CO2: 29 mmol/L (ref 22–32)
Calcium: 9.5 mg/dL (ref 8.9–10.3)
Chloride: 104 mmol/L (ref 98–111)
Creatinine, Ser: 1.38 mg/dL — ABNORMAL HIGH (ref 0.61–1.24)
GFR calc Af Amer: >60 mL/min (ref >60)
GFR calc non Af Amer: 56 mL/min — ABNORMAL LOW (ref >60)
Glucose, Bld: 96 mg/dL (ref 70–99)
Potassium: 4.8 mmol/L (ref 3.5–5.1)
Sodium: 142 mmol/L (ref 135–145)
Total Bilirubin: 0.9 mg/dL (ref 0.3–1.2)
Total Protein: 6.5 g/dL (ref 6.5–8.1)

## 2019-03-20 LAB — CBC WITH DIFFERENTIAL/PLATELET
Abs Immature Granulocytes: 0.01 10*3/uL (ref 0.00–0.07)
Basophils Absolute: 0 10*3/uL (ref 0.0–0.1)
Basophils Relative: 1 %
Eosinophils Absolute: 0.5 10*3/uL (ref 0.0–0.5)
Eosinophils Relative: 11 %
HCT: 47.9 % (ref 39.0–52.0)
Hemoglobin: 15.8 g/dL (ref 13.0–17.0)
Immature Granulocytes: 0 %
Lymphocytes Relative: 27 %
Lymphs Abs: 1.1 10*3/uL (ref 0.7–4.0)
MCH: 31.8 pg (ref 26.0–34.0)
MCHC: 33 g/dL (ref 30.0–36.0)
MCV: 96.4 fL (ref 80.0–100.0)
Monocytes Absolute: 0.5 10*3/uL (ref 0.1–1.0)
Monocytes Relative: 12 %
Neutro Abs: 1.9 10*3/uL (ref 1.7–7.7)
Neutrophils Relative %: 49 %
Platelets: 147 10*3/uL — ABNORMAL LOW (ref 150–400)
RBC: 4.97 MIL/uL (ref 4.22–5.81)
RDW: 13.4 % (ref 11.5–15.5)
WBC: 4 10*3/uL (ref 4.0–10.5)
nRBC: 0 % (ref 0.0–0.2)

## 2019-03-20 MED ORDER — RIVAROXABAN 20 MG PO TABS: ORAL_TABLET | ORAL | 11 refills | Status: DC

## 2019-03-21 ENCOUNTER — Telehealth: Payer: Self-pay

## 2019-03-21 ENCOUNTER — Other Ambulatory Visit: Payer: Self-pay | Admitting: Nurse Practitioner

## 2019-03-21 ENCOUNTER — Telehealth: Payer: Self-pay | Admitting: Nurse Practitioner

## 2019-03-21 DIAGNOSIS — Z86711 Personal history of pulmonary embolism: Secondary | ICD-10-CM

## 2019-03-21 DIAGNOSIS — I82402 Acute embolism and thrombosis of unspecified deep veins of left lower extremity: Secondary | ICD-10-CM

## 2019-03-21 LAB — PSA, TOTAL AND FREE
PSA, Free Pct: 48.6 %
PSA, Free: 0.34 ng/mL
Prostate Specific Ag, Serum: 0.7 ng/mL (ref 0.0–4.0)

## 2019-03-21 MED ORDER — RIVAROXABAN 20 MG PO TABS: ORAL_TABLET | ORAL | 11 refills | Status: DC

## 2019-03-21 NOTE — Telephone Encounter (Signed)
TC per Cira Rue NP to [facility] [phone]) to cancel Xarelto precription. Xarelto now sent to [facility] as requested. Patient made aware.

## 2019-03-21 NOTE — Telephone Encounter (Signed)
Scheduled appt per 10/14 los. ° °Sent a staff message to get a calendar mailed out. °

## 2019-03-21 NOTE — Telephone Encounter (Signed)
TC to pt per Cira Rue NP to let him know his PSA is normal, no concerns. Patient verbalized understanding. No further problems or concerns at this time.

## 2019-03-21 NOTE — Telephone Encounter (Signed)
TC to pt per Nicholas Rue NP to let him know his PSA is normal, no concerns. Patient verbalized understanding. No further problems or concerns at this time.

## 2019-03-21 NOTE — Telephone Encounter (Signed)
Received TC from patients wife stating that Xarelto prescription was sent to wrong pharmacy yesterday. It was sent to [facility] and it should've been sent to [facility]	[place]. Cira Rue NP made aware.

## 2019-04-30 ENCOUNTER — Ambulatory Visit (INDEPENDENT_AMBULATORY_CARE_PROVIDER_SITE_OTHER): Payer: BC Managed Care – PPO | Admitting: Family Medicine

## 2019-04-30 ENCOUNTER — Encounter: Payer: Self-pay | Admitting: Family Medicine

## 2019-04-30 ENCOUNTER — Other Ambulatory Visit: Payer: Self-pay

## 2019-04-30 VITALS — Ht 70.0 in | Wt 225.0 lb

## 2019-04-30 DIAGNOSIS — M48061 Spinal stenosis, lumbar region without neurogenic claudication: Secondary | ICD-10-CM

## 2019-04-30 DIAGNOSIS — R7989 Other specified abnormal findings of blood chemistry: Secondary | ICD-10-CM | POA: Diagnosis not present

## 2019-04-30 DIAGNOSIS — I10 Essential (primary) hypertension: Secondary | ICD-10-CM

## 2019-04-30 DIAGNOSIS — M109 Gout, unspecified: Secondary | ICD-10-CM | POA: Diagnosis not present

## 2019-04-30 DIAGNOSIS — K219 Gastro-esophageal reflux disease without esophagitis: Secondary | ICD-10-CM | POA: Diagnosis not present

## 2019-04-30 DIAGNOSIS — Z86718 Personal history of other venous thrombosis and embolism: Secondary | ICD-10-CM

## 2019-04-30 MED ORDER — COLCHICINE 0.6 MG PO TABS: ORAL_TABLET | ORAL | 1 refills | Status: DC

## 2019-04-30 MED ORDER — PROBENECID 500 MG PO TABS: 500.0000 mg | ORAL_TABLET | Freq: Two times a day (BID) | ORAL | 1 refills | Status: DC

## 2019-04-30 NOTE — Progress Notes (Signed)
I have discussed the procedure for the virtual visit with the patient who has given consent to proceed with assessment and treatment.   Sotirios Navarro L Keghan Mcfarren, CMA     

## 2019-04-30 NOTE — Assessment & Plan Note (Signed)
New to provider.  Pt had 1 provoked DVT and 1 unprovoked DVT.  He had PEs w/ both.  He is now on life long anticoagulation.  Will follow along.

## 2019-04-30 NOTE — Progress Notes (Signed)
Virtual Visit via Video   I connected with patient on 04/30/19 at 11:30 AM EST by a video enabled telemedicine application and verified that I am speaking with the correct person using two identifiers.  Location patient: Home Location provider: [facility]up[facility], Office Persons participating in the virtual visit: Patient, Provider, [name]  I discussed the limitations of evaluation and management by telemedicine and the availability of in person appointments. The patient expressed understanding and agreed to proceed.  Subjective:   HPI:   New to establish.  Gets routine care through [place].  HTN- chronic problem, on Losartan 25mg  daily thru [place].  Last had labs March 20, 2019.  Cr 1.38.  Pt reports white coat HTN.  Pt reports BPs need to be checked twice to allow for it to come down.  GERD- chronic problem, on Omeprazole.  sxs are well controlled.  DVTs- 2 separate occasions.  First in 2015 after back surgery.  After he completed Xarelto, he developed 2nd DVT (unprovoked).  Had PEs both times.  Now on life long anticoag- currently on Xarelto.  Chronic back pain- on gabapentin regularly  Gout- on daily probenecid and colchicine to prevent attacks.  Will take Prednisone if he has attack.  Frequency of attacks seem to vary- tends to happen at least once in the spring and the fall.  Pt has been able to identify some triggers in his diet.  Elevated LFTs- pt reports he had liver US thru [place] 'years ago'.  Labs have been fairly stable.  Due for colonoscopy- goes to [place]  ROS:   See pertinent positives and negatives per HPI.  Patient Active Problem List   Diagnosis Date Noted  . H/O: HTN (hypertension) 02/03/2015  . Gout 01/05/2015  . History of DVT of lower extremity 01/05/2015  . History of pulmonary embolus (PE) 01/05/2015  . Hx of decompressive lumbar laminectomy 05/06/2014  . GERD (gastroesophageal reflux disease) 05/06/2014  . Lumbar stenosis 03/26/2014    Social  History   Tobacco Use  . Smoking status: Former Smoker    Packs/day: 1.50    Years: 24.00    Pack years: 36.00    Types: Cigarettes    Quit date: 06/06/1988    Years since quitting: 30.9  . Smokeless tobacco: Former Systems developer    Types: Snuff, Chew  . Tobacco comment: Quit chewing tobacco around 2000  Substance Use Topics  . Alcohol use: Yes    Alcohol/week: 18.0 standard drinks    Types: 18 Cans of beer per week    Comment: 03/26/2014 "2-3, 12oz beers/day"    Current Outpatient Medications:  .  colchicine 0.6 MG tablet, TAKE 1 TABLET BY MOUTH ONCE DAILY FOR GOUT, Disp: , Rfl:  .  gabapentin (NEURONTIN) 100 MG capsule, Take 100 mg by mouth 3 (three) times daily., Disp: , Rfl:  .  losartan (COZAAR) 25 MG tablet, Take 25 mg by mouth daily., Disp: , Rfl:  .  omeprazole (PRILOSEC) 20 MG capsule, Take 20 mg by mouth daily., Disp: , Rfl:  .  predniSONE (DELTASONE) 5 MG tablet, Take 5 mg by mouth daily as needed. Reported on 12/23/2015, Disp: , Rfl:  .  probenecid (BENEMID) 500 MG tablet, Take 500 mg by mouth daily. , Disp: , Rfl:  .  rivaroxaban (XARELTO) 20 MG TABS tablet, 20mg  po daily, Disp: 30 tablet, Rfl: 11 .  traZODone (DESYREL) 50 MG tablet, Take 50 mg by mouth at bedtime., Disp: , Rfl:   Allergies  Allergen  Reactions  . Penicillins Rash    Objective:   Ht 5\' 10"  (1.778 m)   Wt 225 lb (102.1 kg)   BMI 32.28 kg/m   AAOx3, NAD NCAT, EOMI No obvious CN deficits Coloring WNL Pt is able to speak clearly, coherently without shortness of breath or increased work of breathing.  Thought process is linear.  Mood is appropriate.   Assessment and Plan:   See Problem Based Charting   Annye Asa, MD 04/30/2019

## 2019-04-30 NOTE — Assessment & Plan Note (Signed)
New to provider, ongoing for pt.  Labs appear to have been stable for the last few years.  Reports years ago he had an [place] of liver but not recently.  Hematology asked if he would like to repeat but he has not decided yet.  Will continue to follow.

## 2019-04-30 NOTE — Assessment & Plan Note (Signed)
New to provider, ongoing for pt.  On gabapentin through the [place].  S/p surgery.  Will follow.

## 2019-04-30 NOTE — Assessment & Plan Note (Signed)
New to provider, ongoing for pt.  Currently well controlled on Omeprazole

## 2019-04-30 NOTE — Assessment & Plan Note (Signed)
New to provider, ongoing for pt.  Reports BP is typically high in the office and it often needs to be rechecked.  No way to check home BP today.  On Losartan.  Cr was mildly elevated in October so this will need to be followed closely at future visits.  Pt expressed understanding and is in agreement w/ plan.

## 2019-04-30 NOTE — Assessment & Plan Note (Signed)
New to provider, ongoing for pt.  On both daily Colchicine and Probenecid as prophylaxis.  Uses prednisone during flares.  Refills provided.

## 2019-08-15 DIAGNOSIS — Z23 Encounter for immunization: Secondary | ICD-10-CM | POA: Diagnosis not present

## 2019-09-03 DIAGNOSIS — J019 Acute sinusitis, unspecified: Secondary | ICD-10-CM | POA: Diagnosis not present

## 2019-09-03 DIAGNOSIS — B9689 Other specified bacterial agents as the cause of diseases classified elsewhere: Secondary | ICD-10-CM | POA: Diagnosis not present

## 2019-09-14 DIAGNOSIS — Z23 Encounter for immunization: Secondary | ICD-10-CM | POA: Diagnosis not present

## 2019-09-18 ENCOUNTER — Inpatient Hospital Stay: Payer: Self-pay

## 2019-11-14 ENCOUNTER — Encounter: Payer: BC Managed Care – PPO | Admitting: Family Medicine

## 2019-11-14 DIAGNOSIS — Z0289 Encounter for other administrative examinations: Secondary | ICD-10-CM

## 2019-11-21 ENCOUNTER — Other Ambulatory Visit: Payer: Self-pay | Admitting: Family Medicine

## 2020-02-28 DIAGNOSIS — I10 Essential (primary) hypertension: Secondary | ICD-10-CM | POA: Diagnosis not present

## 2020-02-28 DIAGNOSIS — Z0131 Encounter for examination of blood pressure with abnormal findings: Secondary | ICD-10-CM | POA: Diagnosis not present

## 2020-02-28 DIAGNOSIS — J0140 Acute pansinusitis, unspecified: Secondary | ICD-10-CM | POA: Diagnosis not present

## 2020-03-12 ENCOUNTER — Other Ambulatory Visit: Payer: Self-pay | Admitting: Family Medicine

## 2020-03-13 ENCOUNTER — Telehealth: Payer: Self-pay | Admitting: Family Medicine

## 2020-03-13 MED ORDER — COLCHICINE 0.6 MG PO TABS: 0.6000 mg | ORAL_TABLET | Freq: Every day | ORAL | 0 refills | Status: DC

## 2020-03-13 NOTE — Telephone Encounter (Signed)
Please advise 

## 2020-03-13 NOTE — Telephone Encounter (Signed)
Pt called in asking for a short script of the Colchicine to the [facility]. He has an appt with [facility] on 10/20 but is out of his medication. Please advise

## 2020-03-13 NOTE — Telephone Encounter (Signed)
Medication filled to pharmacy as requested.   

## 2020-03-13 NOTE — Telephone Encounter (Signed)
[place] for 30 days of colchicine

## 2020-03-17 ENCOUNTER — Telehealth: Payer: Self-pay | Admitting: Nurse Practitioner

## 2020-03-17 NOTE — Telephone Encounter (Signed)
Called patient per 10/12 sch msg. Cancelled appointments - ask patient to call back to resch.

## 2020-03-19 ENCOUNTER — Other Ambulatory Visit: Payer: BC Managed Care – PPO

## 2020-03-19 ENCOUNTER — Ambulatory Visit: Payer: BC Managed Care – PPO | Admitting: Nurse Practitioner

## 2020-03-25 ENCOUNTER — Ambulatory Visit (INDEPENDENT_AMBULATORY_CARE_PROVIDER_SITE_OTHER): Payer: BC Managed Care – PPO | Admitting: Family Medicine

## 2020-03-25 ENCOUNTER — Other Ambulatory Visit: Payer: Self-pay

## 2020-03-25 ENCOUNTER — Encounter: Payer: Self-pay | Admitting: Family Medicine

## 2020-03-25 VITALS — BP 136/88 | HR 80 | Temp 98.2°F | Resp 12 | Ht 70.0 in | Wt 231.4 lb

## 2020-03-25 DIAGNOSIS — I10 Essential (primary) hypertension: Secondary | ICD-10-CM | POA: Diagnosis not present

## 2020-03-25 DIAGNOSIS — Z125 Encounter for screening for malignant neoplasm of prostate: Secondary | ICD-10-CM

## 2020-03-25 DIAGNOSIS — Z23 Encounter for immunization: Secondary | ICD-10-CM

## 2020-03-25 DIAGNOSIS — Z86711 Personal history of pulmonary embolism: Secondary | ICD-10-CM

## 2020-03-25 DIAGNOSIS — M6208 Separation of muscle (nontraumatic), other site: Secondary | ICD-10-CM | POA: Insufficient documentation

## 2020-03-25 DIAGNOSIS — M109 Gout, unspecified: Secondary | ICD-10-CM | POA: Diagnosis not present

## 2020-03-25 LAB — PSA: PSA: 0.58 ng/mL (ref 0.10–4.00)

## 2020-03-25 LAB — CBC WITH DIFFERENTIAL/PLATELET
Basophils Absolute: 0 10*3/uL (ref 0.0–0.1)
Basophils Relative: 0.4 % (ref 0.0–3.0)
Eosinophils Absolute: 0.5 10*3/uL (ref 0.0–0.7)
Eosinophils Relative: 12.6 % — ABNORMAL HIGH (ref 0.0–5.0)
HCT: 49.1 % (ref 39.0–52.0)
Hemoglobin: 16.7 g/dL (ref 13.0–17.0)
Lymphocytes Relative: 27.5 % (ref 12.0–46.0)
Lymphs Abs: 1.1 10*3/uL (ref 0.7–4.0)
MCHC: 34 g/dL (ref 30.0–36.0)
MCV: 96.3 fl (ref 78.0–100.0)
Monocytes Absolute: 0.5 10*3/uL (ref 0.1–1.0)
Monocytes Relative: 13.3 % — ABNORMAL HIGH (ref 3.0–12.0)
Neutro Abs: 1.8 10*3/uL (ref 1.4–7.7)
Neutrophils Relative %: 46.2 % (ref 43.0–77.0)
Platelets: 152 10*3/uL (ref 150.0–400.0)
RBC: 5.1 Mil/uL (ref 4.22–5.81)
RDW: 13.7 % (ref 11.5–15.5)
WBC: 3.9 10*3/uL — ABNORMAL LOW (ref 4.0–10.5)

## 2020-03-25 LAB — BASIC METABOLIC PANEL
BUN: 15 mg/dL (ref 6–23)
CO2: 30 mEq/L (ref 19–32)
Calcium: 9.3 mg/dL (ref 8.4–10.5)
Chloride: 102 mEq/L (ref 96–112)
Creatinine, Ser: 1.11 mg/dL (ref 0.40–1.50)
GFR: 71.75 mL/min (ref >60.00)
Glucose, Bld: 88 mg/dL (ref 70–99)
Potassium: 4 mEq/L (ref 3.5–5.1)
Sodium: 140 mEq/L (ref 135–145)

## 2020-03-25 LAB — HEPATIC FUNCTION PANEL
ALT: 108 U/L — ABNORMAL HIGH (ref 0–53)
AST: 70 U/L — ABNORMAL HIGH (ref 0–37)
Albumin: 4.2 g/dL (ref 3.5–5.2)
Alkaline Phosphatase: 45 U/L (ref 39–117)
Bilirubin, Direct: 0.3 mg/dL (ref 0.0–0.3)
Total Bilirubin: 1.4 mg/dL — ABNORMAL HIGH (ref 0.2–1.2)
Total Protein: 6.3 g/dL (ref 6.0–8.3)

## 2020-03-25 LAB — TSH: TSH: 1.76 u[IU]/mL (ref 0.35–4.50)

## 2020-03-25 LAB — LIPID PANEL
Cholesterol: 134 mg/dL (ref 0–200)
HDL: 76 mg/dL (ref >39.00)
LDL Cholesterol: 50 mg/dL (ref 0–99)
NonHDL: 58.37
Total CHOL/HDL Ratio: 2
Triglycerides: 41 mg/dL (ref 0.0–149.0)
VLDL: 8.2 mg/dL (ref 0.0–40.0)

## 2020-03-25 LAB — URIC ACID: Uric Acid, Serum: 7.1 mg/dL (ref 4.0–7.8)

## 2020-03-25 MED ORDER — RIVAROXABAN 20 MG PO TABS: ORAL_TABLET | ORAL | 3 refills | Status: DC

## 2020-03-25 MED ORDER — COLCHICINE 0.6 MG PO TABS
0.6000 mg | ORAL_TABLET | Freq: Every day | ORAL | 3 refills | Status: DC
Start: 2020-03-25 — End: 2021-04-15

## 2020-03-25 NOTE — Assessment & Plan Note (Signed)
New.  Reviewed dx w/ pt and provided reassurance.  Discussed core strengthening.  Will follow.

## 2020-03-25 NOTE — Assessment & Plan Note (Signed)
Chronic problem.  Currently well controlled on regimen of Colchicine 0.6mg  daily and Probenecid 5mg  daily.  Refill provided on Colchicine.  Will check uric acid.

## 2020-03-25 NOTE — Assessment & Plan Note (Signed)
Chronic problem.  Following at the [place].  Currently on Losartan 25mg  daily w/ adequate control.  Denies CP, SOB, HAs, visual changes, edema.

## 2020-03-25 NOTE — Patient Instructions (Addendum)
Schedule your complete physical in 6 months We'll notify you of your lab results and make any changes if needed Continue to work on healthy diet and regular exercise- you can do it! Continue the Xarelto once daily No changes in the Colchicine or Probenecid at this time Your stomach is what we call a diastasis recti- a weakness in that fibrous tissue.  This can sometimes improve w/ core strengthening Call with any questions or concerns Stay Safe!  Stay Healthy!!

## 2020-03-25 NOTE — Progress Notes (Signed)
   Subjective:    Patient ID: Nicholas [name], male    DOB: 11/10/1960, 59 y.o.   MRN: [id number]  HPI PE- Pt has hx of unprovoked PE and is now to be on lifelong anticoagulation.  He was released from hematology and now needs to get his Xarelto filled elsewhere.  Currently on 20mg  daily.  No leg swelling, redness, warmth.  Wears compression socks daily.  No SOB.  Gout- chronic problem, on Colchicine 0.6mg  daily and Probenecid 5mg  daily.  Pt reports sxs have been well controlled on this regimen.  HTN- chronic problem, on Losartan 25mg  daily.  Followed by [facility].  Denies CP, SOB, HAs, visual changes, edema.   Review of Systems For ROS see HPI   This visit occurred during the SARS-CoV-2 public health emergency.  Safety protocols were in place, including screening questions prior to the visit, additional usage of staff PPE, and extensive cleaning of exam room while observing appropriate contact time as indicated for disinfecting solutions.       Objective:   Physical Exam Vitals reviewed.  Constitutional:      General: He is not in acute distress.    Appearance: He is well-developed. He is obese.  HENT:     Head: Normocephalic and atraumatic.  Eyes:     Conjunctiva/sclera: Conjunctivae normal.     Pupils: Pupils are equal, round, and reactive to light.  Neck:     Thyroid: No thyromegaly.  Cardiovascular:     Rate and Rhythm: Normal rate and regular rhythm.     Heart sounds: Normal heart sounds. No murmur heard.   Pulmonary:     Effort: Pulmonary effort is normal. No respiratory distress.     Breath sounds: Normal breath sounds.  Abdominal:     General: Bowel sounds are normal. There is no distension.     Palpations: Abdomen is soft.     Tenderness: There is no abdominal tenderness. There is no guarding.     Comments: Diastasis recti  Musculoskeletal:     Cervical back: Normal range of motion and neck supple.     Right lower leg: No edema.     Left lower leg: No edema.    Lymphadenopathy:     Cervical: No cervical adenopathy.  Skin:    General: Skin is warm and dry.  Neurological:     Mental Status: He is alert and oriented to person, place, and time.     Cranial Nerves: No cranial nerve deficit.  Psychiatric:        Behavior: Behavior normal.           Assessment & Plan:

## 2020-03-25 NOTE — Assessment & Plan Note (Signed)
Pt is now on life long anticoagulation due to unprovoked PE.  Refill on Xarelto provided.

## 2020-04-21 ENCOUNTER — Encounter: Payer: Self-pay | Admitting: Gastroenterology

## 2020-05-26 DIAGNOSIS — Z20822 Contact with and (suspected) exposure to covid-19: Secondary | ICD-10-CM | POA: Diagnosis not present

## 2020-06-10 ENCOUNTER — Other Ambulatory Visit (INDEPENDENT_AMBULATORY_CARE_PROVIDER_SITE_OTHER): Payer: BC Managed Care – PPO

## 2020-06-10 ENCOUNTER — Telehealth: Payer: Self-pay

## 2020-06-10 ENCOUNTER — Encounter: Payer: Self-pay | Admitting: Gastroenterology

## 2020-06-10 ENCOUNTER — Ambulatory Visit: Payer: BC Managed Care – PPO | Admitting: Gastroenterology

## 2020-06-10 VITALS — BP 128/80 | HR 102 | Ht 70.0 in | Wt 234.4 lb

## 2020-06-10 DIAGNOSIS — Z86010 Personal history of colonic polyps: Secondary | ICD-10-CM

## 2020-06-10 DIAGNOSIS — R7401 Elevation of levels of liver transaminase levels: Secondary | ICD-10-CM

## 2020-06-10 DIAGNOSIS — Z7901 Long term (current) use of anticoagulants: Secondary | ICD-10-CM

## 2020-06-10 DIAGNOSIS — R7989 Other specified abnormal findings of blood chemistry: Secondary | ICD-10-CM | POA: Diagnosis not present

## 2020-06-10 LAB — IBC + FERRITIN
Ferritin: 171.9 ng/mL (ref 22.0–322.0)
Iron: 144 ug/dL (ref 42–165)
Saturation Ratios: 38 % (ref 20.0–50.0)
Transferrin: 271 mg/dL (ref 212.0–360.0)

## 2020-06-10 MED ORDER — PLENVU 140 G PO SOLR: 140.0000 g | ORAL | 0 refills | Status: DC

## 2020-06-10 NOTE — Patient Instructions (Signed)
If you are age 61 or older, your body mass index should be between 23-30. Your Body mass index is 33.63 kg/m. If this is out of the aforementioned range listed, please consider follow up with your Primary Care Provider.  If you are age 62 or younger, your body mass index should be between 19-25. Your Body mass index is 33.63 kg/m. If this is out of the aformentioned range listed, please consider follow up with your Primary Care Provider.   You have been scheduled for a colonoscopy. Please follow written instructions given to you at your visit today.  Please pick up your prep supplies at the pharmacy within the next 1-3 days. If you use inhalers (even only as needed), please bring them with you on the day of your procedure.  Your provider has requested that you go to the basement level for lab work before leaving today. Press "B" on the elevator. The lab is located at the first door on the left as you exit the elevator.  You have been scheduled for an abdominal ultrasound at [facility] (1st floor of hospital) on 06-16-2020 at 9:15am Please arrive 15 minutes prior to your appointment for registration. Make certain not to have anything to eat or drink 6 hours prior to your appointment. Should you need to reschedule your appointment, please contact radiology at [phone]. This test typically takes about 30 minutes to perform.  Due to recent changes in healthcare laws, you may see the results of your imaging and laboratory studies on MyChart before your provider has had a chance to review them.  We understand that in some cases there may be results that are confusing or concerning to you. Not all laboratory results come back in the same time frame and the provider may be waiting for multiple results in order to interpret others.  Please give [place] 48 hours in order for your provider to thoroughly review all the results before contacting the office for clarification of your results.    It was a  pleasure to see you today!  Dr. Myrtie Neither

## 2020-06-10 NOTE — Telephone Encounter (Signed)
Pt can stop Xarelto 5 days prior to procedure and restart his medication the next day

## 2020-06-10 NOTE — Telephone Encounter (Signed)
Dr. Birdie Riddle,  The above named patient is scheduled for a colonoscopy on 06-18-2020 with Dr Loletha Carrow. Please provide instructions on when to hold Xarelto.      Request for surgical clearance:     Endoscopy Procedure  What type of surgery is being performed?     Colonoscopy  When is this surgery scheduled?     06-18-2020  What type of clearance is required ?   Pharmacy  Are there any medications that need to be held prior to surgery and how long? Yes, Xarelto, 2 days  Practice name and name of physician performing surgery?      [facility]  What is your office phone and fax number?      Phone- [phone]  [phone]  Anesthesia type (None, local, MAC, general) ?       MAC  Thank you,   Vivien Rota, CMA

## 2020-06-10 NOTE — Progress Notes (Signed)
[facility] Consult Note:  History: Nicholas [name] 06/10/2020  Referring provider: Midge Minium, MD  Reason for consult/chief complaint: Colonoscopy (Last colonoscopy completed in 2017; patient has not complaints at this time) and Hemorrhoids (Stable, "pretty much same as usual")   Subjective  HPI: Nicholas [name] was last seen by me for a routine colonoscopy in July 2017 for personal history of colon polyps and family history of colon cancer in his mother under age 1. 60 subcentimeter adenomatous polyps were removed on that last exam.  3-year recall interval was recommended.  Tranquilino has been feeling generally well.  His bowel habits are regular, and he denies abdominal pain or rectal bleeding.  He denies chronic heartburn, dysphagia, odynophagia, nausea or vomiting.   ROS:  Review of Systems  Constitutional: Negative for appetite change and unexpected weight change.  HENT: Negative for mouth sores and voice change.   Eyes: Negative for pain and redness.  Respiratory: Negative for cough and shortness of breath.   Cardiovascular: Negative for chest pain and palpitations.  Genitourinary: Negative for dysuria and hematuria.  Musculoskeletal: Positive for back pain. Negative for arthralgias and myalgias.       Intermittent gout symptoms  Skin: Negative for pallor and rash.  Neurological: Negative for weakness and headaches.  Hematological: Negative for adenopathy.     Past Medical History: Past Medical History:  Diagnosis Date  . Arthritis    "my whole body"  . Chronic back pain    "mainly lower; some in top too" (03/26/2014)  . DVT (deep venous thrombosis) ([place])   . GERD (gastroesophageal reflux disease)   . History of gout   . Hypertension   . Pulmonary embolism ([place])    Recurrent & unprovoked August 2016. Provoked/Post-op December 2015   Most recent primary care note by Dr. Birdie Riddle on 03/25/2020 was reviewed. She is now managing the patient's lifelong  anticoagulation for history of PE.  Past Surgical History: Past Surgical History:  Procedure Laterality Date  . BACK SURGERY    . CARPAL TUNNEL RELEASE Bilateral 2012  . COLONOSCOPY    . DECOMPRESSIVE LUMBAR LAMINECTOMY LEVEL 3 N/A 03/26/2014   Procedure: L3-S1 DECOMPRESSION;  Surgeon: Melina Schools, MD;  Location: [place];  Service: Orthopedics;  Laterality: N/A;  . INGUINAL HERNIA REPAIR Right 2000's  . LUMBAR LAMINECTOMY/DECOMPRESSION MICRODISCECTOMY  03/26/2014   L3-S1  . lytic therapy  August 2016   For Lower Extremity DVT     Family History: Family History  Problem Relation Age of Onset  . Colon cancer Mother   . Colon cancer Paternal Grandmother        colon cancer  . Lung disease Neg Hx   . Esophageal cancer Neg Hx   . Stomach cancer Neg Hx   . Pancreatic cancer Neg Hx     Social History: Social History   Socioeconomic History  . Marital status: Married    Spouse name: Not on file  . Number of children: Not on file  . Years of education: Not on file  . Highest education level: Not on file  Occupational History  . Occupation: Automotive  Tobacco Use  . Smoking status: Former Smoker    Packs/day: 1.50    Years: 24.00    Pack years: 36.00    Types: Cigarettes    Quit date: 06/06/1988    Years since quitting: 32.0  . Smokeless tobacco: Former Systems developer    Types: Snuff, Chew  . Tobacco comment: Quit chewing tobacco around  2000  Vaping Use  . Vaping Use: Never used  Substance and Sexual Activity  . Alcohol use: Yes    Alcohol/week: 7.0 standard drinks    Types: 7 Cans of beer per week    Comment: 7 beers per week  . Drug use: No  . Sexual activity: Yes  Other Topics Concern  . Not on file  Social History Narrative   Originally from [place]. 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 No pets currently. No bird exposure.    Social Determinants of Health   Financial Resource Strain: Not on file  Food Insecurity: Not on file   Transportation Needs: Not on file  Physical Activity: Not on file  Stress: Not on file  Social Connections: Not on file   Reports 1-2 beers most days. Works in a Psychiatric nurse  Allergies: No Active Allergies  Outpatient Meds: Current Outpatient Medications  Medication Sig Dispense Refill  . colchicine 0.6 MG tablet Take 1 tablet (0.6 mg total) by mouth daily. 90 tablet 3  . gabapentin (NEURONTIN) 100 MG capsule Take 100 mg by mouth 3 (three) times daily.    Marland Kitchen losartan (COZAAR) 25 MG tablet Take 25 mg by mouth daily.    Marland Kitchen omeprazole (PRILOSEC) 20 MG capsule Take 20 mg by mouth daily.    . predniSONE (DELTASONE) 5 MG tablet Take 5 mg by mouth daily as needed. Reported on 12/23/2015    . probenecid (BENEMID) 500 MG tablet Take 1 tablet (500 mg total) by mouth 2 (two) times daily. 180 tablet 1  . rivaroxaban (XARELTO) 20 MG TABS tablet 20mg  po daily 90 tablet 3  . traZODone (DESYREL) 50 MG tablet Take 50 mg by mouth at bedtime.     No current facility-administered medications for this visit.      ___________________________________________________________________ Objective   Exam:  BP 128/80 (BP Location: Left Arm, Patient Position: Sitting, Cuff Size: Normal)   Pulse (!) 102   Ht 5\' 10"  (1.778 m)   Wt 234 lb 6 oz (106.3 kg)   BMI 33.63 kg/m    General: Well-appearing  Eyes: sclera anicteric, no redness  ENT: oral mucosa moist without lesions, no cervical or supraclavicular lymphadenopathy  CV: RRR without murmur, S1/S2, no JVD, no peripheral edema  Resp: clear to auscultation bilaterally, normal RR and effort noted  GI: soft, no tenderness, with active bowel sounds. No guarding or palpable organomegaly noted.  Skin; warm and dry, no rash or jaundice noted.  No spider nevi  Neuro: awake, alert and oriented x 3. Normal gross motor function and fluent speech  Labs:  CBC Latest Ref Rng & Units 03/25/2020 03/20/2019 03/15/2018  WBC 4.0  - 10.5 K/uL 3.9(L) 4.0 6.0  Hemoglobin 13.0 - 17.0 g/dL 16.7 15.8 16.1  Hematocrit 39.0 - 52.0 % 49.1 47.9 48.8  Platelets 150.0 - 400.0 K/uL 152.0 147(L) 184   CMP Latest Ref Rng & Units 03/25/2020 03/20/2019 03/15/2018  Glucose 70 - 99 mg/dL 88 96 91  BUN 6 - 23 mg/dL 15 14 12   Creatinine 0.40 - 1.50 mg/dL 1.11 1.38(H) 1.27(H)  Sodium 135 - 145 mEq/L 140 142 143  Potassium 3.5 - 5.1 mEq/L 4.0 4.8 3.9  Chloride 96 - 112 mEq/L 102 104 106  CO2 19 - 32 mEq/L 30 29 27   Calcium 8.4 - 10.5 mg/dL 9.3 9.5 9.6  Total Protein 6.0 - 8.3 g/dL 6.3 6.5 7.2  Total Bilirubin 0.2 -  1.2 mg/dL 8.2(N) 0.9 1.2  Alkaline Phos 39 - 117 U/L 45 42 49  AST 0 - 37 U/L 70(H) 74(H) 90(H)  ALT 0 - 53 U/L 108(H) 98(H) 133(H)   Negative hepatitis B surface antigen and hepatitis C antibody in 2018   Assessment: Encounter Diagnoses  Name Primary?  . Personal history of colonic polyps Yes  . Current use of long term anticoagulation   . Transaminitis     History of colon polyps, overdue for surveillance colonoscopy. Needs to be off anticoagulation 2 days prior, which appears to be low risk for him.  He was able to do without difficulty for his last colonoscopy several years ago.  We will check with primary care to see if they have any objection. Fa[place] is aware of the small but real risk of PE in the short period of time off anticoagulation.  I noted on his chart review he has had elevated AST and ALT for several years.  He recalls some testing at the [place] many years ago including an ultrasound was told he had fatty liver.  It sounds like he has had no further work-up or follow-up for that, and I do not find any abdominal imaging in this EMR.  Unclear if the fatty liver history is accurate, and if so whether it is metabolic or alcohol induced or both.  His alcohol use does not sound heavy. I note his borderline leukopenia and thrombocytopenia that is nonspecific but could indicate portal hypertension.  He was  agreeable to further work-up Plan:  Colonoscopy.  The benefits and risks of the planned procedure were described in detail with the patient or (when appropriate) their health care proxy.  Risks were outlined as including, but not limited to, bleeding, infection, perforation, adverse medication reaction leading to cardiac or pulmonary decompensation, pancreatitis (if ERCP).  The limitation of incomplete mucosal visualization was also discussed.  No guarantees or warranties were given.  We will message primary care regarding the [facility].  Right upper quadrant ultrasound  Repeat viral hepatitis panel, autoimmune liver labs and iron studies.  Thank you for the courtesy of this consult.  Please call me with any questions or concerns.  Charlie Pitter III  CC: Referring provider noted above

## 2020-06-11 NOTE — Telephone Encounter (Signed)
I spoke to the patients wife and she will give instructions to Mr Doeden. No further questions at this time

## 2020-06-12 LAB — MITOCHONDRIAL ANTIBODIES: Mitochondrial M2 Ab, IgG: <=20 U

## 2020-06-12 LAB — ANA: Anti Nuclear Antibody (ANA): NEGATIVE

## 2020-06-12 LAB — HEPATITIS B SURFACE ANTIGEN: Hepatitis B Surface Ag: NONREACTIVE

## 2020-06-12 LAB — ANTI-SMOOTH MUSCLE ANTIBODY, IGG: Actin (Smooth Muscle) Antibody (IGG): <20 U (ref <20)

## 2020-06-12 LAB — HEPATITIS C ANTIBODY
Hepatitis C Ab: NONREACTIVE
SIGNAL TO CUT-OFF: 0.02 (ref <1.00)

## 2020-06-12 LAB — HEPATITIS B SURFACE ANTIBODY,QUALITATIVE: Hep B S Ab: NONREACTIVE

## 2020-06-16 ENCOUNTER — Ambulatory Visit (HOSPITAL_COMMUNITY): Payer: BC Managed Care – PPO

## 2020-06-18 ENCOUNTER — Encounter: Payer: Self-pay | Admitting: Gastroenterology

## 2020-06-18 ENCOUNTER — Other Ambulatory Visit: Payer: Self-pay

## 2020-06-18 ENCOUNTER — Ambulatory Visit (AMBULATORY_SURGERY_CENTER): Payer: BC Managed Care – PPO | Admitting: Gastroenterology

## 2020-06-18 VITALS — BP 132/89 | HR 70 | Temp 97.1°F | Resp 14 | Ht 70.0 in | Wt 234.0 lb

## 2020-06-18 DIAGNOSIS — Z86010 Personal history of colonic polyps: Secondary | ICD-10-CM | POA: Diagnosis not present

## 2020-06-18 DIAGNOSIS — D124 Benign neoplasm of descending colon: Secondary | ICD-10-CM | POA: Diagnosis not present

## 2020-06-18 DIAGNOSIS — D123 Benign neoplasm of transverse colon: Secondary | ICD-10-CM | POA: Diagnosis not present

## 2020-06-18 DIAGNOSIS — D125 Benign neoplasm of sigmoid colon: Secondary | ICD-10-CM | POA: Diagnosis not present

## 2020-06-18 DIAGNOSIS — D120 Benign neoplasm of cecum: Secondary | ICD-10-CM | POA: Diagnosis not present

## 2020-06-18 DIAGNOSIS — Z1211 Encounter for screening for malignant neoplasm of colon: Secondary | ICD-10-CM | POA: Diagnosis not present

## 2020-06-18 DIAGNOSIS — D122 Benign neoplasm of ascending colon: Secondary | ICD-10-CM

## 2020-06-18 DIAGNOSIS — D128 Benign neoplasm of rectum: Secondary | ICD-10-CM | POA: Diagnosis not present

## 2020-06-18 DIAGNOSIS — D129 Benign neoplasm of anus and anal canal: Secondary | ICD-10-CM | POA: Diagnosis not present

## 2020-06-18 MED ORDER — SODIUM CHLORIDE 0.9 % IV SOLN: 500.0000 mL | Freq: Once | INTRAVENOUS | Status: DC

## 2020-06-18 NOTE — Op Note (Signed)
[facility] Patient Name: Nicholas [name] Procedure Date: 06/18/2020 12:00 PM MRN: [id number] Endoscopist: Colo. Loletha Carrow , MD Age: 60 Referring MD:  Date of Birth: 03-28-1961 Gender: Male Account #: [id number] Procedure:                Colonoscopy Indications:              Surveillance: Personal history of adenomatous                            polyps on last colonoscopy > 3 years ago (tubular                            adenoma x 5 in July 2017) Medicines:                Monitored Anesthesia Care Procedure:                Pre-Anesthesia Assessment:                           - Prior to the procedure, a History and Physical                            was performed, and patient medications and                            allergies were reviewed. The patient's tolerance of                            previous anesthesia was also reviewed. The risks                            and benefits of the procedure and the sedation                            options and risks were discussed with the patient.                            All questions were answered, and informed consent                            was obtained. Prior Anticoagulants: The patient has                            taken Xarelto (rivaroxaban), last dose was 2 days                            prior to procedure. ASA Grade Assessment: II - A                            patient with mild systemic disease. After reviewing                            the risks and benefits, the patient was deemed in  satisfactory condition to undergo the procedure.                           After obtaining informed consent, the colonoscope                            was passed under direct vision. Throughout the                            procedure, the patient's blood pressure, pulse, and                            oxygen saturations were monitored continuously. The                            Olympus CF-HQ190L [id number])  Colonoscope was                            introduced through the anus and advanced to the the                            terminal ileum, with identification of the                            appendiceal orifice and IC valve. The colonoscopy                            was performed without difficulty. The patient                            tolerated the procedure well. The quality of the                            bowel preparation was good. The terminal ileum,                            ileocecal valve, appendiceal orifice, and rectum                            were photographed. Scope In: 12:15:30 PM Scope Out: 12:49:55 PM Scope Withdrawal Time: 0 hours 27 minutes 58 seconds  Total Procedure Duration: 0 hours 34 minutes 25 seconds  Findings:                 The perianal and digital rectal examinations were                            normal.                           Four sessile polyps were found in the transverse                            colon and ileocecal valve. The polyps were  diminutive in size. These polyps were removed with                            a cold snare. Resection and retrieval were complete.                           Three sessile polyps were found in the ascending                            colon. The polyps were diminutive in size. These                            polyps were removed with a cold snare. Resection                            and retrieval were complete.                           Four sessile polyps were found in the transverse                            colon. The polyps were diminutive in size (largest                            was 53mm). These polyps were removed with a cold                            snare. Resection and retrieval were complete.                           Four sessile polyps were found in the transverse                            colon. The polyps were diminutive in size. These                             polyps were removed with a cold snare. Resection                            and retrieval were complete.                           Five sessile and flat polyps were found in the                            rectum, sigmoid colon, proximal descending colon                            and distal transverse colon. The polyps were                            diminutive in size. These polyps were removed with  a cold snare. Resection and retrieval were complete.                           The exam was otherwise without abnormality on                            direct and retroflexion views. Complications:            No immediate complications. Estimated Blood Loss:     Estimated blood loss was minimal. Impression:               - Four diminutive polyps in the transverse colon                            and at the ileocecal valve, removed with a cold                            snare. Resected and retrieved.                           - Three diminutive polyps in the ascending colon,                            removed with a cold snare. Resected and retrieved.                           - Four diminutive polyps in the transverse colon,                            removed with a cold snare. Resected and retrieved.                           - Four diminutive polyps in the transverse colon,                            removed with a cold snare. Resected and retrieved.                           - Five diminutive polyps in the rectum, in the                            sigmoid colon, in the proximal descending colon and                            in the distal transverse colon, removed with a cold                            snare. Resected and retrieved.                           - The examination was otherwise normal on direct                            and retroflexion views. Recommendation:           -  Patient has a contact number available for                             emergencies. The signs and symptoms of potential                            delayed complications were discussed with the                            patient. Return to normal activities tomorrow.                            Written discharge instructions were provided to the                            patient.                           - Resume previous diet.                           - Continue present medications.                           - Resume Xarelto (rivaroxaban) at prior dose in 2                            days.                           - Await pathology results.                           - Repeat colonoscopy in 1 year for surveillance. Elber Galyean L. Loletha Carrow, MD 06/18/2020 12:57:40 PM This report has been signed electronically.

## 2020-06-18 NOTE — Patient Instructions (Signed)
Impression/Recommendations:  Polyp handout given to patient.  Resume previous diet. Continue present medications. Resume Xarelto (rivaroxaban) at prior dose in 2 days. Await pathology results.  Repeat colonoscopy in 1 year for surveillance.  YOU HAD AN ENDOSCOPIC PROCEDURE TODAY AT [facility]:   Refer to the procedure report that was given to you for any specific questions about what was found during the examination.  If the procedure report does not answer your questions, please call your gastroenterologist to clarify.  If you requested that your care partner not be given the details of your procedure findings, then the procedure report has been included in a sealed envelope for you to review at your convenience later.  YOU SHOULD EXPECT: Some feelings of bloating in the abdomen. Passage of more gas than usual.  Walking can help get rid of the air that was put into your GI tract during the procedure and reduce the bloating. If you had a lower endoscopy (such as a colonoscopy or flexible sigmoidoscopy) you may notice spotting of blood in your stool or on the toilet paper. If you underwent a bowel prep for your procedure, you may not have a normal bowel movement for a few days.  Please Note:  You might notice some irritation and congestion in your nose or some drainage.  This is from the oxygen used during your procedure.  There is no need for concern and it should clear up in a day or so.  SYMPTOMS TO REPORT IMMEDIATELY:   Following lower endoscopy (colonoscopy or flexible sigmoidoscopy):  Excessive amounts of blood in the stool  Significant tenderness or worsening of abdominal pains  Swelling of the abdomen that is new, acute  Fever of 100F or higher  For urgent or emergent issues, a gastroenterologist can be reached at any hour by calling [phone]. Do not use MyChart messaging for urgent concerns.    DIET:  We do recommend a small meal at first, but then you  may proceed to your regular diet.  Drink plenty of fluids but you should avoid alcoholic beverages for 24 hours.  ACTIVITY:  You should plan to take it easy for the rest of today and you should NOT DRIVE or use heavy machinery until tomorrow (because of the sedation medicines used during the test).    FOLLOW UP: Our staff will call the number listed on your records 48-72 hours following your procedure to check on you and address any questions or concerns that you may have regarding the information given to you following your procedure. If we do not reach you, we will leave a message.  We will attempt to reach you two times.  During this call, we will ask if you have developed any symptoms of COVID 19. If you develop any symptoms (ie: fever, flu-like symptoms, shortness of breath, cough etc.) before then, please call [phone].  If you test positive for Covid 19 in the 2 weeks post procedure, please call and report this information to [place].    If any biopsies were taken you will be contacted by phone or by letter within the next 1-3 weeks.  Please call us at [phone] if you have not heard about the biopsies in 3 weeks.    SIGNATURES/CONFIDENTIALITY: You and/or your care partner have signed paperwork which will be entered into your electronic medical record.  These signatures attest to the fact that that the information above on your After Visit Summary has been reviewed and is understood.  Full responsibility of the confidentiality of this discharge information lies with you and/or your care-partner.

## 2020-06-18 NOTE — Progress Notes (Signed)
Called to room to assist during endoscopic procedure.  Patient ID and intended procedure confirmed with present staff. Received instructions for my participation in the procedure from the performing physician.  

## 2020-06-18 NOTE — Progress Notes (Signed)
VS by CW.  Pt had a previsit and states there are no changes to his health since that visit

## 2020-06-18 NOTE — Progress Notes (Signed)
Report given to PACU, vss 

## 2020-06-30 ENCOUNTER — Other Ambulatory Visit: Payer: Self-pay

## 2020-06-30 ENCOUNTER — Ambulatory Visit (HOSPITAL_COMMUNITY)
Admission: RE | Admit: 2020-06-30 | Discharge: 2020-06-30 | Disposition: A | Discharge status: Home / Self Care | Payer: BC Managed Care – PPO | Source: Ambulatory Visit | Attending: Gastroenterology | Admitting: Gastroenterology

## 2020-06-30 DIAGNOSIS — K7689 Other specified diseases of liver: Secondary | ICD-10-CM | POA: Diagnosis not present

## 2020-06-30 DIAGNOSIS — Z86010 Personal history of colonic polyps: Secondary | ICD-10-CM | POA: Diagnosis not present

## 2020-06-30 DIAGNOSIS — Z7901 Long term (current) use of anticoagulants: Secondary | ICD-10-CM | POA: Diagnosis not present

## 2020-06-30 DIAGNOSIS — K760 Fatty (change of) liver, not elsewhere classified: Secondary | ICD-10-CM | POA: Diagnosis not present

## 2020-06-30 DIAGNOSIS — R7401 Elevation of levels of liver transaminase levels: Secondary | ICD-10-CM | POA: Diagnosis not present

## 2020-07-02 ENCOUNTER — Telehealth: Payer: Self-pay | Admitting: Gastroenterology

## 2020-07-02 ENCOUNTER — Encounter: Payer: Self-pay | Admitting: Gastroenterology

## 2020-07-02 NOTE — Telephone Encounter (Signed)
Pt is returning a missed call regarding his [place] results.

## 2020-07-02 NOTE — Telephone Encounter (Signed)
Spoke with patient, see 06/30/20 [place] result note for more information.

## 2020-07-23 ENCOUNTER — Encounter: Payer: Self-pay | Admitting: Family Medicine

## 2020-07-23 ENCOUNTER — Other Ambulatory Visit: Payer: Self-pay

## 2020-07-23 ENCOUNTER — Ambulatory Visit (HOSPITAL_COMMUNITY)
Admission: RE | Admit: 2020-07-23 | Discharge: 2020-07-23 | Disposition: A | Discharge status: Home / Self Care | Payer: BC Managed Care – PPO | Source: Ambulatory Visit | Attending: Family Medicine | Admitting: Family Medicine

## 2020-07-23 ENCOUNTER — Ambulatory Visit: Payer: BC Managed Care – PPO | Admitting: Family Medicine

## 2020-07-23 VITALS — BP 144/90 | HR 64 | Temp 98.1°F | Ht 70.0 in | Wt 237.6 lb

## 2020-07-23 DIAGNOSIS — M7989 Other specified soft tissue disorders: Secondary | ICD-10-CM

## 2020-07-23 DIAGNOSIS — Z86718 Personal history of other venous thrombosis and embolism: Secondary | ICD-10-CM | POA: Diagnosis not present

## 2020-07-23 DIAGNOSIS — M7122 Synovial cyst of popliteal space [Baker], left knee: Secondary | ICD-10-CM | POA: Diagnosis not present

## 2020-07-23 NOTE — Patient Instructions (Addendum)
We'll notify you of your ultrasound results ASAP Continue your Xarelto daily as directed I will send a copy of today's note to Dr Burr Medico (hematology) so that they are aware of what's going on Continue the compression socks and elevate when you can Call with any questions or concerns Hang in there!!

## 2020-07-23 NOTE — Progress Notes (Signed)
   Subjective:    Patient ID: Nicholas [name], male    DOB: 16-Jan-1961, 60 y.o.   MRN: [id number]  HPI Leg swelling- pt reports L leg has been swollen since colonoscopy (06/18/20).  Had to stop Xarelto 5 days prior to colonoscopy and didn't restart until 2 days after procedure.  No redness of leg.  Pt denies pain but has 'tingles' at night.  Pt reports 'a little' shortness of breath starting in the past week.  Pt reports the swelling improves overnight but doesn't resolve.  Worsens as day goes on.  Wearing compression socks to help w/ swelling.   Review of Systems For ROS see HPI   This visit occurred during the SARS-CoV-2 public health emergency.  Safety protocols were in place, including screening questions prior to the visit, additional usage of staff PPE, and extensive cleaning of exam room while observing appropriate contact time as indicated for disinfecting solutions.       Objective:   Physical Exam Vitals reviewed.  Constitutional:      General: He is not in acute distress.    Appearance: Normal appearance. He is not ill-appearing.  HENT:     Head: Normocephalic and atraumatic.  Cardiovascular:     Rate and Rhythm: Normal rate and regular rhythm.     Pulses: Normal pulses.     Heart sounds: Normal heart sounds. No murmur heard.   Pulmonary:     Effort: Pulmonary effort is normal. No respiratory distress.     Breath sounds: Normal breath sounds. No wheezing.  Musculoskeletal:        General: No tenderness.     Right lower leg: No edema.     Left lower leg: No edema (pt wearing compression socks, no notable swelling).     Comments: Possible cord behind L knee  Skin:    General: Skin is warm and dry.     Findings: No erythema.  Neurological:     Mental Status: He is alert.           Assessment & Plan:  L leg swelling- new.  Pt reports sxs started after his colonoscopy which required him to stop his Xarelto x7 days.  He has hx of DVT and PE (1 provoked, 1 unprovoked)  and is on life long anticoagulation.  Denies CP or tachycardia.  In the last week has noticed a bit more shortness of breath.  Will get doppler to assess for DVT.  If DVT present will need CTA.  Reviewed supportive care and red flags that should prompt return.  Pt expressed understanding and is in agreement w/ plan.

## 2020-09-23 ENCOUNTER — Ambulatory Visit (INDEPENDENT_AMBULATORY_CARE_PROVIDER_SITE_OTHER): Payer: BC Managed Care – PPO | Admitting: Family Medicine

## 2020-09-23 ENCOUNTER — Other Ambulatory Visit: Payer: Self-pay

## 2020-09-23 ENCOUNTER — Encounter: Payer: Self-pay | Admitting: Family Medicine

## 2020-09-23 VITALS — BP 118/82 | HR 74 | Temp 97.7°F | Resp 18 | Ht 71.0 in | Wt 235.6 lb

## 2020-09-23 DIAGNOSIS — R5383 Other fatigue: Secondary | ICD-10-CM

## 2020-09-23 DIAGNOSIS — Z0000 Encounter for general adult medical examination without abnormal findings: Secondary | ICD-10-CM

## 2020-09-23 DIAGNOSIS — M130 Polyarthritis, unspecified: Secondary | ICD-10-CM

## 2020-09-23 DIAGNOSIS — N529 Male erectile dysfunction, unspecified: Secondary | ICD-10-CM | POA: Diagnosis not present

## 2020-09-23 DIAGNOSIS — Z125 Encounter for screening for malignant neoplasm of prostate: Secondary | ICD-10-CM | POA: Diagnosis not present

## 2020-09-23 DIAGNOSIS — E669 Obesity, unspecified: Secondary | ICD-10-CM | POA: Insufficient documentation

## 2020-09-23 DIAGNOSIS — E6609 Other obesity due to excess calories: Secondary | ICD-10-CM | POA: Diagnosis not present

## 2020-09-23 DIAGNOSIS — Z6832 Body mass index (BMI) 32.0-32.9, adult: Secondary | ICD-10-CM | POA: Diagnosis not present

## 2020-09-23 MED ORDER — SILDENAFIL CITRATE 100 MG PO TABS: 50.0000 mg | ORAL_TABLET | Freq: Every day | ORAL | 3 refills | Status: DC | PRN

## 2020-09-23 MED ORDER — CELECOXIB 100 MG PO CAPS: 100.0000 mg | ORAL_CAPSULE | Freq: Every day | ORAL | 3 refills | Status: DC

## 2020-09-23 NOTE — Progress Notes (Signed)
Subjective:    Patient ID: Nicholas [name], male    DOB: 05/16/1961, 60 y.o.   MRN: [id number]  HPI CPE- UTD on colonoscopy, Flu shot, Tdap, COVID.  Reviewed past medical, surgical, family and social histories.   Patient Care Team    Relationship Specialty Notifications Start End  Midge Minium, MD PCP - General Family Medicine  04/30/19   Doran Stabler, MD Consulting Physician Gastroenterology  04/30/19   [name], MD Consulting Physician Hematology  04/30/19     Health Maintenance  Topic Date Due  . COVID-19 Vaccine (3 - Booster for Moderna series) 10/09/2020 (Originally 03/15/2020)  . HIV Screening  09/23/2021 (Originally 02/24/1975)  . INFLUENZA VACCINE  01/04/2021  . COLONOSCOPY (Pts 45-44yrs Insurance coverage will need to be confirmed)  06/18/2021  . TETANUS/TDAP  04/30/2023  . Hepatitis C Screening  Completed  . HPV VACCINES  Aged Out      Review of Systems Patient reports no vision/hearing changes, anorexia, fever ,adenopathy, persistant/recurrent hoarseness, swallowing issues, chest pain, palpitations, edema, persistant/recurrent cough, hemoptysis, gastrointestinal  bleeding (melena, rectal bleeding), abdominal pain, excessive heart burn, GU symptoms (dysuria, hematuria, voiding/incontinence issues) syncope, focal weakness, memory loss, numbness & tingling, skin/hair/nail changes, depression, anxiety, abnormal bruising/bleeding.   + fatigue + polyarthritis- wrists bilaterally, strong family hx of arthritis.  + gout.  Has not seen rheum. + ED- pt reports 6 months of difficulty maintaining an erection. + SOB w/ exertion  This visit occurred during the SARS-CoV-2 public health emergency.  Safety protocols were in place, including screening questions prior to the visit, additional usage of staff PPE, and extensive cleaning of exam room while observing appropriate contact time as indicated for disinfecting solutions.       Objective:   Physical Exam General  Appearance:    Alert, cooperative, no distress, appears stated age  Head:    Normocephalic, without obvious abnormality, atraumatic  Eyes:    PERRL, conjunctiva/corneas clear, EOM's intact, fundi    benign, both eyes       Ears:    Normal TM's and external ear canals, both ears  Nose:   Deferred due to COVID  Throat:   Neck:   Supple, symmetrical, trachea midline, no adenopathy;       thyroid:  No enlargement/tenderness/nodules  Back:     Symmetric, no curvature, ROM normal, no CVA tenderness  Lungs:     Clear to auscultation bilaterally, respirations unlabored  Chest wall:    No tenderness or deformity  Heart:    Regular rate and rhythm, S1 and S2 normal, no murmur, rub   or gallop  Abdomen:     Soft, non-tender, bowel sounds active all four quadrants,    no masses, no organomegaly  Genitalia:    Deferred   Rectal:    Extremities:   Extremities atraumatic, no cyanosis or edema.  Periarticular swelling of wrists bilaterally, R knee  Pulses:   2+ and symmetric all extremities  Skin:   Skin color, texture, turgor normal, no rashes or lesions  Lymph nodes:   Cervical, supraclavicular, and axillary nodes normal  Neurologic:   CNII-XII intact. Normal strength, sensation and reflexes      throughout          Assessment & Plan:  Fatigue- new.  Pt reports sleeping well but he continues to feel tired.  Will check labs to assess for possible metabolic cause  ED- new.  Pt reports ~6 months of sxs.  He  is nervous to discuss this issue.  Given his ED and his fatigue will check testosterone level.  Will also start Viagra prn.  Pt expressed understanding and is in agreement w/ plan.   Polyarthritis- pt w/ obvious joint swelling of wrists and large pre-patellar bursa of R knee.  Will check RF as he reports strong family history of arthritis.  Known gout.  Offered Rheum referral, pt wants to see if medication works prior to seeing a specialist.  Will start low dose, once daily Celebrex but do so  carefully as he is on the Xarelto.  Cautioned that if he sees any signs of abnormal bleeding or bruising he needed to stop the Celebrex and notify me immediately.  Pt expressed understanding and is in agreement w/ plan.

## 2020-09-23 NOTE — Assessment & Plan Note (Addendum)
Pt's PE WNL w/ exception of obesity and polyarticular swelling.  UTD on colonoscopy, flu, COVID, Tdap.  Encouraged COVID booster.  Check labs.  Anticipatory guidance provided.

## 2020-09-23 NOTE — Patient Instructions (Addendum)
Follow up in 6 months to recheck BP We'll notify you of your lab results and make any changes if needed Continue to work on healthy diet and regular exercise- you're doing great! Use the Sildenafil (Viagra) as needed.  Start w/ 1/2 tab. Start the Celebrex 1 tab daily w/ food for the joint inflammation.  IF you develop any abnormal bleeding or bruising, let me know If pain doesn't improve, let me know so we can refer to Rheumatology Schedule your COVID booster at your convenience Call with any questions or concerns Stay Safe!  Stay Healthy! Happy Spring!!!

## 2020-09-23 NOTE — Assessment & Plan Note (Signed)
New.  Pt's BMI is 32.86.  Discussed need for healthy diet and regular exercise.  Suspect some of his SOB is due to deconditioning.  Check labs to risk stratify.  Will follow.

## 2020-09-24 LAB — HEPATIC FUNCTION PANEL
ALT: 134 U/L — ABNORMAL HIGH (ref 0–53)
AST: 110 U/L — ABNORMAL HIGH (ref 0–37)
Albumin: 4.2 g/dL (ref 3.5–5.2)
Alkaline Phosphatase: 50 U/L (ref 39–117)
Bilirubin, Direct: 0.3 mg/dL (ref 0.0–0.3)
Total Bilirubin: 1.2 mg/dL (ref 0.2–1.2)
Total Protein: 6.8 g/dL (ref 6.0–8.3)

## 2020-09-24 LAB — BASIC METABOLIC PANEL
BUN: 14 mg/dL (ref 6–23)
CO2: 29 mEq/L (ref 19–32)
Calcium: 9.7 mg/dL (ref 8.4–10.5)
Chloride: 101 mEq/L (ref 96–112)
Creatinine, Ser: 1.26 mg/dL (ref 0.40–1.50)
GFR: 61.96 mL/min (ref >60.00)
Glucose, Bld: 87 mg/dL (ref 70–99)
Potassium: 4.1 mEq/L (ref 3.5–5.1)
Sodium: 139 mEq/L (ref 135–145)

## 2020-09-24 LAB — LIPID PANEL
Cholesterol: 143 mg/dL (ref 0–200)
HDL: 74.3 mg/dL (ref >39.00)
LDL Cholesterol: 56 mg/dL (ref 0–99)
NonHDL: 68.76
Total CHOL/HDL Ratio: 2
Triglycerides: 66 mg/dL (ref 0.0–149.0)
VLDL: 13.2 mg/dL (ref 0.0–40.0)

## 2020-09-24 LAB — RHEUMATOID FACTOR: Rheumatoid fact SerPl-aCnc: <14 IU/mL (ref <14)

## 2020-09-24 LAB — TSH: TSH: 2.07 u[IU]/mL (ref 0.35–4.50)

## 2020-09-24 LAB — CBC WITH DIFFERENTIAL/PLATELET
Basophils Absolute: 0 10*3/uL (ref 0.0–0.1)
Basophils Relative: 1 % (ref 0.0–3.0)
Eosinophils Absolute: 0.4 10*3/uL (ref 0.0–0.7)
Eosinophils Relative: 9.9 % — ABNORMAL HIGH (ref 0.0–5.0)
HCT: 53.3 % — ABNORMAL HIGH (ref 39.0–52.0)
Hemoglobin: 17.7 g/dL — ABNORMAL HIGH (ref 13.0–17.0)
Lymphocytes Relative: 26.5 % (ref 12.0–46.0)
Lymphs Abs: 1.1 10*3/uL (ref 0.7–4.0)
MCHC: 33.3 g/dL (ref 30.0–36.0)
MCV: 98.9 fl (ref 78.0–100.0)
Monocytes Absolute: 0.6 10*3/uL (ref 0.1–1.0)
Monocytes Relative: 13.9 % — ABNORMAL HIGH (ref 3.0–12.0)
Neutro Abs: 2.1 10*3/uL (ref 1.4–7.7)
Neutrophils Relative %: 48.7 % (ref 43.0–77.0)
Platelets: 165 10*3/uL (ref 150.0–400.0)
RBC: 5.39 Mil/uL (ref 4.22–5.81)
RDW: 13.2 % (ref 11.5–15.5)
WBC: 4.3 10*3/uL (ref 4.0–10.5)

## 2020-09-24 LAB — TESTOSTERONE: Testosterone: 587.83 ng/dL (ref 300.00–890.00)

## 2020-09-24 LAB — PSA: PSA: 0.85 ng/mL (ref 0.10–4.00)

## 2020-10-28 DIAGNOSIS — M4727 Other spondylosis with radiculopathy, lumbosacral region: Secondary | ICD-10-CM | POA: Diagnosis not present

## 2020-10-28 DIAGNOSIS — M9903 Segmental and somatic dysfunction of lumbar region: Secondary | ICD-10-CM | POA: Diagnosis not present

## 2020-10-28 DIAGNOSIS — M9904 Segmental and somatic dysfunction of sacral region: Secondary | ICD-10-CM | POA: Diagnosis not present

## 2020-10-28 DIAGNOSIS — M4728 Other spondylosis with radiculopathy, sacral and sacrococcygeal region: Secondary | ICD-10-CM | POA: Diagnosis not present

## 2020-10-29 DIAGNOSIS — M4728 Other spondylosis with radiculopathy, sacral and sacrococcygeal region: Secondary | ICD-10-CM | POA: Diagnosis not present

## 2020-10-29 DIAGNOSIS — M9903 Segmental and somatic dysfunction of lumbar region: Secondary | ICD-10-CM | POA: Diagnosis not present

## 2020-10-29 DIAGNOSIS — M9904 Segmental and somatic dysfunction of sacral region: Secondary | ICD-10-CM | POA: Diagnosis not present

## 2020-10-29 DIAGNOSIS — M4727 Other spondylosis with radiculopathy, lumbosacral region: Secondary | ICD-10-CM | POA: Diagnosis not present

## 2020-11-03 DIAGNOSIS — M9903 Segmental and somatic dysfunction of lumbar region: Secondary | ICD-10-CM | POA: Diagnosis not present

## 2020-11-03 DIAGNOSIS — M4728 Other spondylosis with radiculopathy, sacral and sacrococcygeal region: Secondary | ICD-10-CM | POA: Diagnosis not present

## 2020-11-03 DIAGNOSIS — M9904 Segmental and somatic dysfunction of sacral region: Secondary | ICD-10-CM | POA: Diagnosis not present

## 2020-11-03 DIAGNOSIS — M4727 Other spondylosis with radiculopathy, lumbosacral region: Secondary | ICD-10-CM | POA: Diagnosis not present

## 2020-11-04 DIAGNOSIS — M4727 Other spondylosis with radiculopathy, lumbosacral region: Secondary | ICD-10-CM | POA: Diagnosis not present

## 2020-11-04 DIAGNOSIS — M9904 Segmental and somatic dysfunction of sacral region: Secondary | ICD-10-CM | POA: Diagnosis not present

## 2020-11-04 DIAGNOSIS — M9903 Segmental and somatic dysfunction of lumbar region: Secondary | ICD-10-CM | POA: Diagnosis not present

## 2020-11-04 DIAGNOSIS — M4728 Other spondylosis with radiculopathy, sacral and sacrococcygeal region: Secondary | ICD-10-CM | POA: Diagnosis not present

## 2020-11-05 DIAGNOSIS — M4727 Other spondylosis with radiculopathy, lumbosacral region: Secondary | ICD-10-CM | POA: Diagnosis not present

## 2020-11-05 DIAGNOSIS — M5442 Lumbago with sciatica, left side: Secondary | ICD-10-CM | POA: Diagnosis not present

## 2020-11-05 DIAGNOSIS — M4723 Other spondylosis with radiculopathy, cervicothoracic region: Secondary | ICD-10-CM | POA: Diagnosis not present

## 2020-11-05 DIAGNOSIS — M4728 Other spondylosis with radiculopathy, sacral and sacrococcygeal region: Secondary | ICD-10-CM | POA: Diagnosis not present

## 2020-11-09 DIAGNOSIS — M4727 Other spondylosis with radiculopathy, lumbosacral region: Secondary | ICD-10-CM | POA: Diagnosis not present

## 2020-11-09 DIAGNOSIS — M4723 Other spondylosis with radiculopathy, cervicothoracic region: Secondary | ICD-10-CM | POA: Diagnosis not present

## 2020-11-09 DIAGNOSIS — M4728 Other spondylosis with radiculopathy, sacral and sacrococcygeal region: Secondary | ICD-10-CM | POA: Diagnosis not present

## 2020-11-09 DIAGNOSIS — M5442 Lumbago with sciatica, left side: Secondary | ICD-10-CM | POA: Diagnosis not present

## 2020-11-12 DIAGNOSIS — M4727 Other spondylosis with radiculopathy, lumbosacral region: Secondary | ICD-10-CM | POA: Diagnosis not present

## 2020-11-12 DIAGNOSIS — M5442 Lumbago with sciatica, left side: Secondary | ICD-10-CM | POA: Diagnosis not present

## 2020-11-12 DIAGNOSIS — M4728 Other spondylosis with radiculopathy, sacral and sacrococcygeal region: Secondary | ICD-10-CM | POA: Diagnosis not present

## 2020-11-12 DIAGNOSIS — M4723 Other spondylosis with radiculopathy, cervicothoracic region: Secondary | ICD-10-CM | POA: Diagnosis not present

## 2020-11-16 DIAGNOSIS — M4723 Other spondylosis with radiculopathy, cervicothoracic region: Secondary | ICD-10-CM | POA: Diagnosis not present

## 2020-11-16 DIAGNOSIS — M5442 Lumbago with sciatica, left side: Secondary | ICD-10-CM | POA: Diagnosis not present

## 2020-11-16 DIAGNOSIS — M4727 Other spondylosis with radiculopathy, lumbosacral region: Secondary | ICD-10-CM | POA: Diagnosis not present

## 2020-11-16 DIAGNOSIS — M4728 Other spondylosis with radiculopathy, sacral and sacrococcygeal region: Secondary | ICD-10-CM | POA: Diagnosis not present

## 2020-11-23 DIAGNOSIS — M4727 Other spondylosis with radiculopathy, lumbosacral region: Secondary | ICD-10-CM | POA: Diagnosis not present

## 2020-11-23 DIAGNOSIS — M4723 Other spondylosis with radiculopathy, cervicothoracic region: Secondary | ICD-10-CM | POA: Diagnosis not present

## 2020-11-23 DIAGNOSIS — M5442 Lumbago with sciatica, left side: Secondary | ICD-10-CM | POA: Diagnosis not present

## 2020-11-23 DIAGNOSIS — M4728 Other spondylosis with radiculopathy, sacral and sacrococcygeal region: Secondary | ICD-10-CM | POA: Diagnosis not present

## 2020-11-26 DIAGNOSIS — M5442 Lumbago with sciatica, left side: Secondary | ICD-10-CM | POA: Diagnosis not present

## 2020-11-26 DIAGNOSIS — M4723 Other spondylosis with radiculopathy, cervicothoracic region: Secondary | ICD-10-CM | POA: Diagnosis not present

## 2020-11-26 DIAGNOSIS — M4728 Other spondylosis with radiculopathy, sacral and sacrococcygeal region: Secondary | ICD-10-CM | POA: Diagnosis not present

## 2020-11-26 DIAGNOSIS — M4727 Other spondylosis with radiculopathy, lumbosacral region: Secondary | ICD-10-CM | POA: Diagnosis not present

## 2020-11-30 DIAGNOSIS — M4723 Other spondylosis with radiculopathy, cervicothoracic region: Secondary | ICD-10-CM | POA: Diagnosis not present

## 2020-11-30 DIAGNOSIS — M4728 Other spondylosis with radiculopathy, sacral and sacrococcygeal region: Secondary | ICD-10-CM | POA: Diagnosis not present

## 2020-11-30 DIAGNOSIS — M4727 Other spondylosis with radiculopathy, lumbosacral region: Secondary | ICD-10-CM | POA: Diagnosis not present

## 2020-11-30 DIAGNOSIS — M5442 Lumbago with sciatica, left side: Secondary | ICD-10-CM | POA: Diagnosis not present

## 2020-12-02 ENCOUNTER — Encounter: Payer: Self-pay | Admitting: *Deleted

## 2020-12-02 DIAGNOSIS — M4723 Other spondylosis with radiculopathy, cervicothoracic region: Secondary | ICD-10-CM | POA: Diagnosis not present

## 2020-12-02 DIAGNOSIS — M5442 Lumbago with sciatica, left side: Secondary | ICD-10-CM | POA: Diagnosis not present

## 2020-12-02 DIAGNOSIS — M4727 Other spondylosis with radiculopathy, lumbosacral region: Secondary | ICD-10-CM | POA: Diagnosis not present

## 2020-12-02 DIAGNOSIS — M4728 Other spondylosis with radiculopathy, sacral and sacrococcygeal region: Secondary | ICD-10-CM | POA: Diagnosis not present

## 2020-12-03 DIAGNOSIS — M5442 Lumbago with sciatica, left side: Secondary | ICD-10-CM | POA: Diagnosis not present

## 2020-12-03 DIAGNOSIS — M4723 Other spondylosis with radiculopathy, cervicothoracic region: Secondary | ICD-10-CM | POA: Diagnosis not present

## 2020-12-03 DIAGNOSIS — M4728 Other spondylosis with radiculopathy, sacral and sacrococcygeal region: Secondary | ICD-10-CM | POA: Diagnosis not present

## 2020-12-03 DIAGNOSIS — M4727 Other spondylosis with radiculopathy, lumbosacral region: Secondary | ICD-10-CM | POA: Diagnosis not present

## 2020-12-15 DIAGNOSIS — M4723 Other spondylosis with radiculopathy, cervicothoracic region: Secondary | ICD-10-CM | POA: Diagnosis not present

## 2020-12-15 DIAGNOSIS — M4727 Other spondylosis with radiculopathy, lumbosacral region: Secondary | ICD-10-CM | POA: Diagnosis not present

## 2020-12-15 DIAGNOSIS — M5442 Lumbago with sciatica, left side: Secondary | ICD-10-CM | POA: Diagnosis not present

## 2020-12-15 DIAGNOSIS — M4728 Other spondylosis with radiculopathy, sacral and sacrococcygeal region: Secondary | ICD-10-CM | POA: Diagnosis not present

## 2020-12-17 DIAGNOSIS — M4727 Other spondylosis with radiculopathy, lumbosacral region: Secondary | ICD-10-CM | POA: Diagnosis not present

## 2020-12-17 DIAGNOSIS — M5442 Lumbago with sciatica, left side: Secondary | ICD-10-CM | POA: Diagnosis not present

## 2020-12-17 DIAGNOSIS — M4723 Other spondylosis with radiculopathy, cervicothoracic region: Secondary | ICD-10-CM | POA: Diagnosis not present

## 2020-12-17 DIAGNOSIS — M4728 Other spondylosis with radiculopathy, sacral and sacrococcygeal region: Secondary | ICD-10-CM | POA: Diagnosis not present

## 2020-12-22 DIAGNOSIS — M4723 Other spondylosis with radiculopathy, cervicothoracic region: Secondary | ICD-10-CM | POA: Diagnosis not present

## 2020-12-22 DIAGNOSIS — M5442 Lumbago with sciatica, left side: Secondary | ICD-10-CM | POA: Diagnosis not present

## 2020-12-22 DIAGNOSIS — M4728 Other spondylosis with radiculopathy, sacral and sacrococcygeal region: Secondary | ICD-10-CM | POA: Diagnosis not present

## 2020-12-22 DIAGNOSIS — M4727 Other spondylosis with radiculopathy, lumbosacral region: Secondary | ICD-10-CM | POA: Diagnosis not present

## 2020-12-24 DIAGNOSIS — M4728 Other spondylosis with radiculopathy, sacral and sacrococcygeal region: Secondary | ICD-10-CM | POA: Diagnosis not present

## 2020-12-24 DIAGNOSIS — Z7289 Other problems related to lifestyle: Secondary | ICD-10-CM | POA: Diagnosis not present

## 2020-12-24 DIAGNOSIS — M5442 Lumbago with sciatica, left side: Secondary | ICD-10-CM | POA: Diagnosis not present

## 2020-12-24 DIAGNOSIS — I10 Essential (primary) hypertension: Secondary | ICD-10-CM | POA: Diagnosis not present

## 2020-12-24 DIAGNOSIS — K219 Gastro-esophageal reflux disease without esophagitis: Secondary | ICD-10-CM | POA: Diagnosis not present

## 2020-12-24 DIAGNOSIS — R748 Abnormal levels of other serum enzymes: Secondary | ICD-10-CM | POA: Diagnosis not present

## 2020-12-24 DIAGNOSIS — M4723 Other spondylosis with radiculopathy, cervicothoracic region: Secondary | ICD-10-CM | POA: Diagnosis not present

## 2020-12-24 DIAGNOSIS — M109 Gout, unspecified: Secondary | ICD-10-CM | POA: Diagnosis not present

## 2020-12-24 DIAGNOSIS — M4727 Other spondylosis with radiculopathy, lumbosacral region: Secondary | ICD-10-CM | POA: Diagnosis not present

## 2020-12-29 DIAGNOSIS — M4727 Other spondylosis with radiculopathy, lumbosacral region: Secondary | ICD-10-CM | POA: Diagnosis not present

## 2020-12-29 DIAGNOSIS — M4728 Other spondylosis with radiculopathy, sacral and sacrococcygeal region: Secondary | ICD-10-CM | POA: Diagnosis not present

## 2020-12-29 DIAGNOSIS — M5442 Lumbago with sciatica, left side: Secondary | ICD-10-CM | POA: Diagnosis not present

## 2020-12-29 DIAGNOSIS — M4723 Other spondylosis with radiculopathy, cervicothoracic region: Secondary | ICD-10-CM | POA: Diagnosis not present

## 2020-12-31 DIAGNOSIS — M5442 Lumbago with sciatica, left side: Secondary | ICD-10-CM | POA: Diagnosis not present

## 2020-12-31 DIAGNOSIS — M4723 Other spondylosis with radiculopathy, cervicothoracic region: Secondary | ICD-10-CM | POA: Diagnosis not present

## 2020-12-31 DIAGNOSIS — M4728 Other spondylosis with radiculopathy, sacral and sacrococcygeal region: Secondary | ICD-10-CM | POA: Diagnosis not present

## 2020-12-31 DIAGNOSIS — M4727 Other spondylosis with radiculopathy, lumbosacral region: Secondary | ICD-10-CM | POA: Diagnosis not present

## 2021-01-05 DIAGNOSIS — M4728 Other spondylosis with radiculopathy, sacral and sacrococcygeal region: Secondary | ICD-10-CM | POA: Diagnosis not present

## 2021-01-05 DIAGNOSIS — M5442 Lumbago with sciatica, left side: Secondary | ICD-10-CM | POA: Diagnosis not present

## 2021-01-05 DIAGNOSIS — M4723 Other spondylosis with radiculopathy, cervicothoracic region: Secondary | ICD-10-CM | POA: Diagnosis not present

## 2021-01-05 DIAGNOSIS — M4727 Other spondylosis with radiculopathy, lumbosacral region: Secondary | ICD-10-CM | POA: Diagnosis not present

## 2021-01-12 DIAGNOSIS — M4723 Other spondylosis with radiculopathy, cervicothoracic region: Secondary | ICD-10-CM | POA: Diagnosis not present

## 2021-01-12 DIAGNOSIS — M4728 Other spondylosis with radiculopathy, sacral and sacrococcygeal region: Secondary | ICD-10-CM | POA: Diagnosis not present

## 2021-01-12 DIAGNOSIS — M5442 Lumbago with sciatica, left side: Secondary | ICD-10-CM | POA: Diagnosis not present

## 2021-01-12 DIAGNOSIS — M4727 Other spondylosis with radiculopathy, lumbosacral region: Secondary | ICD-10-CM | POA: Diagnosis not present

## 2021-01-14 DIAGNOSIS — M4728 Other spondylosis with radiculopathy, sacral and sacrococcygeal region: Secondary | ICD-10-CM | POA: Diagnosis not present

## 2021-01-14 DIAGNOSIS — M4723 Other spondylosis with radiculopathy, cervicothoracic region: Secondary | ICD-10-CM | POA: Diagnosis not present

## 2021-01-14 DIAGNOSIS — M4727 Other spondylosis with radiculopathy, lumbosacral region: Secondary | ICD-10-CM | POA: Diagnosis not present

## 2021-01-14 DIAGNOSIS — M5442 Lumbago with sciatica, left side: Secondary | ICD-10-CM | POA: Diagnosis not present

## 2021-01-19 DIAGNOSIS — M5442 Lumbago with sciatica, left side: Secondary | ICD-10-CM | POA: Diagnosis not present

## 2021-01-19 DIAGNOSIS — M4727 Other spondylosis with radiculopathy, lumbosacral region: Secondary | ICD-10-CM | POA: Diagnosis not present

## 2021-01-19 DIAGNOSIS — M4728 Other spondylosis with radiculopathy, sacral and sacrococcygeal region: Secondary | ICD-10-CM | POA: Diagnosis not present

## 2021-01-19 DIAGNOSIS — M4723 Other spondylosis with radiculopathy, cervicothoracic region: Secondary | ICD-10-CM | POA: Diagnosis not present

## 2021-01-21 DIAGNOSIS — M4728 Other spondylosis with radiculopathy, sacral and sacrococcygeal region: Secondary | ICD-10-CM | POA: Diagnosis not present

## 2021-01-21 DIAGNOSIS — M5442 Lumbago with sciatica, left side: Secondary | ICD-10-CM | POA: Diagnosis not present

## 2021-01-21 DIAGNOSIS — M4727 Other spondylosis with radiculopathy, lumbosacral region: Secondary | ICD-10-CM | POA: Diagnosis not present

## 2021-01-21 DIAGNOSIS — M4723 Other spondylosis with radiculopathy, cervicothoracic region: Secondary | ICD-10-CM | POA: Diagnosis not present

## 2021-01-26 DIAGNOSIS — M4728 Other spondylosis with radiculopathy, sacral and sacrococcygeal region: Secondary | ICD-10-CM | POA: Diagnosis not present

## 2021-01-26 DIAGNOSIS — M4723 Other spondylosis with radiculopathy, cervicothoracic region: Secondary | ICD-10-CM | POA: Diagnosis not present

## 2021-01-26 DIAGNOSIS — M5442 Lumbago with sciatica, left side: Secondary | ICD-10-CM | POA: Diagnosis not present

## 2021-01-26 DIAGNOSIS — M4727 Other spondylosis with radiculopathy, lumbosacral region: Secondary | ICD-10-CM | POA: Diagnosis not present

## 2021-01-28 DIAGNOSIS — M4728 Other spondylosis with radiculopathy, sacral and sacrococcygeal region: Secondary | ICD-10-CM | POA: Diagnosis not present

## 2021-01-28 DIAGNOSIS — M4723 Other spondylosis with radiculopathy, cervicothoracic region: Secondary | ICD-10-CM | POA: Diagnosis not present

## 2021-01-28 DIAGNOSIS — M5442 Lumbago with sciatica, left side: Secondary | ICD-10-CM | POA: Diagnosis not present

## 2021-01-28 DIAGNOSIS — M4727 Other spondylosis with radiculopathy, lumbosacral region: Secondary | ICD-10-CM | POA: Diagnosis not present

## 2021-02-02 DIAGNOSIS — M5442 Lumbago with sciatica, left side: Secondary | ICD-10-CM | POA: Diagnosis not present

## 2021-02-02 DIAGNOSIS — M4723 Other spondylosis with radiculopathy, cervicothoracic region: Secondary | ICD-10-CM | POA: Diagnosis not present

## 2021-02-02 DIAGNOSIS — M4727 Other spondylosis with radiculopathy, lumbosacral region: Secondary | ICD-10-CM | POA: Diagnosis not present

## 2021-02-02 DIAGNOSIS — M4728 Other spondylosis with radiculopathy, sacral and sacrococcygeal region: Secondary | ICD-10-CM | POA: Diagnosis not present

## 2021-02-04 ENCOUNTER — Other Ambulatory Visit: Payer: Self-pay | Admitting: Family Medicine

## 2021-02-04 DIAGNOSIS — M4728 Other spondylosis with radiculopathy, sacral and sacrococcygeal region: Secondary | ICD-10-CM | POA: Diagnosis not present

## 2021-02-04 DIAGNOSIS — M4723 Other spondylosis with radiculopathy, cervicothoracic region: Secondary | ICD-10-CM | POA: Diagnosis not present

## 2021-02-04 DIAGNOSIS — M4727 Other spondylosis with radiculopathy, lumbosacral region: Secondary | ICD-10-CM | POA: Diagnosis not present

## 2021-02-04 DIAGNOSIS — M5442 Lumbago with sciatica, left side: Secondary | ICD-10-CM | POA: Diagnosis not present

## 2021-02-05 NOTE — Telephone Encounter (Signed)
LFD 09/23/20 #30 with 3 refills LOV 09/23/20 NOV none

## 2021-02-09 DIAGNOSIS — M4723 Other spondylosis with radiculopathy, cervicothoracic region: Secondary | ICD-10-CM | POA: Diagnosis not present

## 2021-02-09 DIAGNOSIS — M4728 Other spondylosis with radiculopathy, sacral and sacrococcygeal region: Secondary | ICD-10-CM | POA: Diagnosis not present

## 2021-02-09 DIAGNOSIS — M4727 Other spondylosis with radiculopathy, lumbosacral region: Secondary | ICD-10-CM | POA: Diagnosis not present

## 2021-02-09 DIAGNOSIS — M5442 Lumbago with sciatica, left side: Secondary | ICD-10-CM | POA: Diagnosis not present

## 2021-02-11 DIAGNOSIS — M5442 Lumbago with sciatica, left side: Secondary | ICD-10-CM | POA: Diagnosis not present

## 2021-02-11 DIAGNOSIS — M4728 Other spondylosis with radiculopathy, sacral and sacrococcygeal region: Secondary | ICD-10-CM | POA: Diagnosis not present

## 2021-02-11 DIAGNOSIS — M4723 Other spondylosis with radiculopathy, cervicothoracic region: Secondary | ICD-10-CM | POA: Diagnosis not present

## 2021-02-11 DIAGNOSIS — M4727 Other spondylosis with radiculopathy, lumbosacral region: Secondary | ICD-10-CM | POA: Diagnosis not present

## 2021-02-16 DIAGNOSIS — M4727 Other spondylosis with radiculopathy, lumbosacral region: Secondary | ICD-10-CM | POA: Diagnosis not present

## 2021-02-16 DIAGNOSIS — M4723 Other spondylosis with radiculopathy, cervicothoracic region: Secondary | ICD-10-CM | POA: Diagnosis not present

## 2021-02-16 DIAGNOSIS — M4728 Other spondylosis with radiculopathy, sacral and sacrococcygeal region: Secondary | ICD-10-CM | POA: Diagnosis not present

## 2021-02-16 DIAGNOSIS — M5442 Lumbago with sciatica, left side: Secondary | ICD-10-CM | POA: Diagnosis not present

## 2021-02-18 DIAGNOSIS — M4723 Other spondylosis with radiculopathy, cervicothoracic region: Secondary | ICD-10-CM | POA: Diagnosis not present

## 2021-02-18 DIAGNOSIS — M5442 Lumbago with sciatica, left side: Secondary | ICD-10-CM | POA: Diagnosis not present

## 2021-02-18 DIAGNOSIS — M4728 Other spondylosis with radiculopathy, sacral and sacrococcygeal region: Secondary | ICD-10-CM | POA: Diagnosis not present

## 2021-02-18 DIAGNOSIS — M4727 Other spondylosis with radiculopathy, lumbosacral region: Secondary | ICD-10-CM | POA: Diagnosis not present

## 2021-02-23 DIAGNOSIS — M4728 Other spondylosis with radiculopathy, sacral and sacrococcygeal region: Secondary | ICD-10-CM | POA: Diagnosis not present

## 2021-02-23 DIAGNOSIS — M4723 Other spondylosis with radiculopathy, cervicothoracic region: Secondary | ICD-10-CM | POA: Diagnosis not present

## 2021-02-23 DIAGNOSIS — M5442 Lumbago with sciatica, left side: Secondary | ICD-10-CM | POA: Diagnosis not present

## 2021-02-23 DIAGNOSIS — M4727 Other spondylosis with radiculopathy, lumbosacral region: Secondary | ICD-10-CM | POA: Diagnosis not present

## 2021-03-09 DIAGNOSIS — M4727 Other spondylosis with radiculopathy, lumbosacral region: Secondary | ICD-10-CM | POA: Diagnosis not present

## 2021-03-09 DIAGNOSIS — M5442 Lumbago with sciatica, left side: Secondary | ICD-10-CM | POA: Diagnosis not present

## 2021-03-09 DIAGNOSIS — M4723 Other spondylosis with radiculopathy, cervicothoracic region: Secondary | ICD-10-CM | POA: Diagnosis not present

## 2021-03-09 DIAGNOSIS — M4728 Other spondylosis with radiculopathy, sacral and sacrococcygeal region: Secondary | ICD-10-CM | POA: Diagnosis not present

## 2021-03-10 ENCOUNTER — Other Ambulatory Visit: Payer: Self-pay | Admitting: Family

## 2021-03-17 DIAGNOSIS — M4723 Other spondylosis with radiculopathy, cervicothoracic region: Secondary | ICD-10-CM | POA: Diagnosis not present

## 2021-03-17 DIAGNOSIS — M4727 Other spondylosis with radiculopathy, lumbosacral region: Secondary | ICD-10-CM | POA: Diagnosis not present

## 2021-03-17 DIAGNOSIS — M4728 Other spondylosis with radiculopathy, sacral and sacrococcygeal region: Secondary | ICD-10-CM | POA: Diagnosis not present

## 2021-03-17 DIAGNOSIS — M5442 Lumbago with sciatica, left side: Secondary | ICD-10-CM | POA: Diagnosis not present

## 2021-03-18 ENCOUNTER — Other Ambulatory Visit: Payer: Self-pay

## 2021-03-18 MED ORDER — CELECOXIB 100 MG PO CAPS: 100.0000 mg | ORAL_CAPSULE | Freq: Every day | ORAL | 0 refills | Status: DC

## 2021-03-21 ENCOUNTER — Other Ambulatory Visit: Payer: Self-pay | Admitting: Family Medicine

## 2021-03-21 DIAGNOSIS — Z86711 Personal history of pulmonary embolism: Secondary | ICD-10-CM

## 2021-03-24 DIAGNOSIS — M4727 Other spondylosis with radiculopathy, lumbosacral region: Secondary | ICD-10-CM | POA: Diagnosis not present

## 2021-03-24 DIAGNOSIS — M5442 Lumbago with sciatica, left side: Secondary | ICD-10-CM | POA: Diagnosis not present

## 2021-03-24 DIAGNOSIS — M4728 Other spondylosis with radiculopathy, sacral and sacrococcygeal region: Secondary | ICD-10-CM | POA: Diagnosis not present

## 2021-03-24 DIAGNOSIS — M4723 Other spondylosis with radiculopathy, cervicothoracic region: Secondary | ICD-10-CM | POA: Diagnosis not present

## 2021-03-31 DIAGNOSIS — M4723 Other spondylosis with radiculopathy, cervicothoracic region: Secondary | ICD-10-CM | POA: Diagnosis not present

## 2021-03-31 DIAGNOSIS — M5442 Lumbago with sciatica, left side: Secondary | ICD-10-CM | POA: Diagnosis not present

## 2021-03-31 DIAGNOSIS — M4728 Other spondylosis with radiculopathy, sacral and sacrococcygeal region: Secondary | ICD-10-CM | POA: Diagnosis not present

## 2021-03-31 DIAGNOSIS — M4727 Other spondylosis with radiculopathy, lumbosacral region: Secondary | ICD-10-CM | POA: Diagnosis not present

## 2021-04-14 ENCOUNTER — Other Ambulatory Visit: Payer: Self-pay | Admitting: Family Medicine

## 2021-04-14 DIAGNOSIS — M4727 Other spondylosis with radiculopathy, lumbosacral region: Secondary | ICD-10-CM | POA: Diagnosis not present

## 2021-04-14 DIAGNOSIS — M4728 Other spondylosis with radiculopathy, sacral and sacrococcygeal region: Secondary | ICD-10-CM | POA: Diagnosis not present

## 2021-04-14 DIAGNOSIS — M5442 Lumbago with sciatica, left side: Secondary | ICD-10-CM | POA: Diagnosis not present

## 2021-04-14 DIAGNOSIS — M4723 Other spondylosis with radiculopathy, cervicothoracic region: Secondary | ICD-10-CM | POA: Diagnosis not present

## 2021-04-21 DIAGNOSIS — M4728 Other spondylosis with radiculopathy, sacral and sacrococcygeal region: Secondary | ICD-10-CM | POA: Diagnosis not present

## 2021-04-21 DIAGNOSIS — M4727 Other spondylosis with radiculopathy, lumbosacral region: Secondary | ICD-10-CM | POA: Diagnosis not present

## 2021-04-21 DIAGNOSIS — M4723 Other spondylosis with radiculopathy, cervicothoracic region: Secondary | ICD-10-CM | POA: Diagnosis not present

## 2021-04-21 DIAGNOSIS — M5442 Lumbago with sciatica, left side: Secondary | ICD-10-CM | POA: Diagnosis not present

## 2021-04-26 DIAGNOSIS — M4723 Other spondylosis with radiculopathy, cervicothoracic region: Secondary | ICD-10-CM | POA: Diagnosis not present

## 2021-04-26 DIAGNOSIS — M5442 Lumbago with sciatica, left side: Secondary | ICD-10-CM | POA: Diagnosis not present

## 2021-04-26 DIAGNOSIS — M4728 Other spondylosis with radiculopathy, sacral and sacrococcygeal region: Secondary | ICD-10-CM | POA: Diagnosis not present

## 2021-04-26 DIAGNOSIS — M4727 Other spondylosis with radiculopathy, lumbosacral region: Secondary | ICD-10-CM | POA: Diagnosis not present

## 2021-05-05 DIAGNOSIS — M4723 Other spondylosis with radiculopathy, cervicothoracic region: Secondary | ICD-10-CM | POA: Diagnosis not present

## 2021-05-05 DIAGNOSIS — M4728 Other spondylosis with radiculopathy, sacral and sacrococcygeal region: Secondary | ICD-10-CM | POA: Diagnosis not present

## 2021-05-05 DIAGNOSIS — M4727 Other spondylosis with radiculopathy, lumbosacral region: Secondary | ICD-10-CM | POA: Diagnosis not present

## 2021-05-05 DIAGNOSIS — M5442 Lumbago with sciatica, left side: Secondary | ICD-10-CM | POA: Diagnosis not present

## 2021-05-12 DIAGNOSIS — M4723 Other spondylosis with radiculopathy, cervicothoracic region: Secondary | ICD-10-CM | POA: Diagnosis not present

## 2021-05-12 DIAGNOSIS — M4727 Other spondylosis with radiculopathy, lumbosacral region: Secondary | ICD-10-CM | POA: Diagnosis not present

## 2021-05-12 DIAGNOSIS — M5442 Lumbago with sciatica, left side: Secondary | ICD-10-CM | POA: Diagnosis not present

## 2021-05-12 DIAGNOSIS — M4728 Other spondylosis with radiculopathy, sacral and sacrococcygeal region: Secondary | ICD-10-CM | POA: Diagnosis not present

## 2021-05-13 ENCOUNTER — Other Ambulatory Visit: Payer: Self-pay | Admitting: Family Medicine

## 2021-05-20 DIAGNOSIS — M5442 Lumbago with sciatica, left side: Secondary | ICD-10-CM | POA: Diagnosis not present

## 2021-05-20 DIAGNOSIS — M4723 Other spondylosis with radiculopathy, cervicothoracic region: Secondary | ICD-10-CM | POA: Diagnosis not present

## 2021-05-20 DIAGNOSIS — M4727 Other spondylosis with radiculopathy, lumbosacral region: Secondary | ICD-10-CM | POA: Diagnosis not present

## 2021-05-20 DIAGNOSIS — M4728 Other spondylosis with radiculopathy, sacral and sacrococcygeal region: Secondary | ICD-10-CM | POA: Diagnosis not present

## 2021-05-26 DIAGNOSIS — M4727 Other spondylosis with radiculopathy, lumbosacral region: Secondary | ICD-10-CM | POA: Diagnosis not present

## 2021-05-26 DIAGNOSIS — M4728 Other spondylosis with radiculopathy, sacral and sacrococcygeal region: Secondary | ICD-10-CM | POA: Diagnosis not present

## 2021-05-26 DIAGNOSIS — M4723 Other spondylosis with radiculopathy, cervicothoracic region: Secondary | ICD-10-CM | POA: Diagnosis not present

## 2021-05-26 DIAGNOSIS — M5442 Lumbago with sciatica, left side: Secondary | ICD-10-CM | POA: Diagnosis not present

## 2021-06-08 ENCOUNTER — Other Ambulatory Visit: Payer: Self-pay | Admitting: Family Medicine

## 2021-06-09 DIAGNOSIS — M4723 Other spondylosis with radiculopathy, cervicothoracic region: Secondary | ICD-10-CM | POA: Diagnosis not present

## 2021-06-09 DIAGNOSIS — M4727 Other spondylosis with radiculopathy, lumbosacral region: Secondary | ICD-10-CM | POA: Diagnosis not present

## 2021-06-09 DIAGNOSIS — M4728 Other spondylosis with radiculopathy, sacral and sacrococcygeal region: Secondary | ICD-10-CM | POA: Diagnosis not present

## 2021-06-09 DIAGNOSIS — M9901 Segmental and somatic dysfunction of cervical region: Secondary | ICD-10-CM | POA: Diagnosis not present

## 2021-06-09 DIAGNOSIS — M5442 Lumbago with sciatica, left side: Secondary | ICD-10-CM | POA: Diagnosis not present

## 2021-06-16 DIAGNOSIS — M4723 Other spondylosis with radiculopathy, cervicothoracic region: Secondary | ICD-10-CM | POA: Diagnosis not present

## 2021-06-16 DIAGNOSIS — M4727 Other spondylosis with radiculopathy, lumbosacral region: Secondary | ICD-10-CM | POA: Diagnosis not present

## 2021-06-16 DIAGNOSIS — M4728 Other spondylosis with radiculopathy, sacral and sacrococcygeal region: Secondary | ICD-10-CM | POA: Diagnosis not present

## 2021-06-16 DIAGNOSIS — M9901 Segmental and somatic dysfunction of cervical region: Secondary | ICD-10-CM | POA: Diagnosis not present

## 2021-06-23 ENCOUNTER — Encounter: Payer: Self-pay | Admitting: Gastroenterology

## 2021-06-30 DIAGNOSIS — M4727 Other spondylosis with radiculopathy, lumbosacral region: Secondary | ICD-10-CM | POA: Diagnosis not present

## 2021-06-30 DIAGNOSIS — M4723 Other spondylosis with radiculopathy, cervicothoracic region: Secondary | ICD-10-CM | POA: Diagnosis not present

## 2021-06-30 DIAGNOSIS — M4728 Other spondylosis with radiculopathy, sacral and sacrococcygeal region: Secondary | ICD-10-CM | POA: Diagnosis not present

## 2021-06-30 DIAGNOSIS — M9901 Segmental and somatic dysfunction of cervical region: Secondary | ICD-10-CM | POA: Diagnosis not present

## 2021-07-06 ENCOUNTER — Ambulatory Visit: Payer: BC Managed Care – PPO | Admitting: Gastroenterology

## 2021-07-06 ENCOUNTER — Encounter: Payer: Self-pay | Admitting: Gastroenterology

## 2021-07-06 ENCOUNTER — Other Ambulatory Visit: Payer: Self-pay

## 2021-07-06 ENCOUNTER — Other Ambulatory Visit: Payer: Self-pay | Admitting: Family Medicine

## 2021-07-06 VITALS — BP 102/64 | HR 96 | Ht 71.0 in | Wt 232.5 lb

## 2021-07-06 DIAGNOSIS — Z86010 Personal history of colonic polyps: Secondary | ICD-10-CM | POA: Diagnosis not present

## 2021-07-06 DIAGNOSIS — K760 Fatty (change of) liver, not elsewhere classified: Secondary | ICD-10-CM | POA: Diagnosis not present

## 2021-07-06 DIAGNOSIS — Z7902 Long term (current) use of antithrombotics/antiplatelets: Secondary | ICD-10-CM | POA: Diagnosis not present

## 2021-07-06 DIAGNOSIS — R7989 Other specified abnormal findings of blood chemistry: Secondary | ICD-10-CM | POA: Diagnosis not present

## 2021-07-06 MED ORDER — COLCHICINE 0.6 MG PO TABS: 0.6000 mg | ORAL_TABLET | Freq: Every day | ORAL | 0 refills | Status: DC

## 2021-07-06 MED ORDER — PLENVU 140 G PO SOLR: 140.0000 g | ORAL | 0 refills | Status: DC

## 2021-07-06 NOTE — Progress Notes (Signed)
[name] Progress Note:  History: Nicholas [name] 07/06/2021  Referring provider: Midge Minium, MD  Reason for consult/chief complaint: Colon Polyps (Last colon in Jan 2022.  Recommended to repeat in 1 yr.)   Subjective  HPI:  Nicholas [name] was here to follow-up for history of colon polyps and plan surveillance colonoscopy as well as follow-up of fatty liver.  5 adenomatous polyps on colonoscopy July 2017. 14 adenomatous polyps and hyperplastic polyp January 2022. Fatty liver, known for years and apparently diagnosed at [place] prior to current primary care.  Negative autoimmune and infectious work-up when I saw him a year ago. Diffuse increased echogenicity of hepatic parenchyma on ultrasound January 2022.  He has felt well from a digestive standpoint.  Bowel habits are regular, no rectal bleeding.  No chronic upper digestive symptoms.  He typically has 7-8 beers per week by his report, some weeks a couple more on the weekend. Denies chest pain dyspnea or dysuria Has chronic back pain that he attributes to work   Past Medical History: Past Medical History:  Diagnosis Date   Arthritis    "my whole body"   Chronic back pain    "mainly lower; some in top too" (03/26/2014)   DVT (deep venous thrombosis) (H[facility])    GERD (gastroesophageal reflux disease)    History of gout    Hypertension    Pulmonary embolism ([place])    Recurrent & unprovoked August 2016. Provoked/Post-op December 2015     Past Surgical History: Past Surgical History:  Procedure Laterality Date   BACK SURGERY     CARPAL TUNNEL RELEASE Bilateral 2012   COLONOSCOPY     DECOMPRESSIVE LUMBAR LAMINECTOMY LEVEL 3 N/A 03/26/2014   Procedure: L3-S1 DECOMPRESSION;  Surgeon: Melina Schools, MD;  Location: [facility];  Service: Orthopedics;  Laterality: N/A;   INGUINAL HERNIA REPAIR Right 2000's   LUMBAR LAMINECTOMY/DECOMPRESSION MICRODISCECTOMY  03/26/2014   L3-S1   lytic therapy  August 2016   For  Lower Extremity DVT     Family History: Family History  Problem Relation Age of Onset   Colon cancer Mother    Colon cancer Paternal Grandmother        colon cancer   Lung disease Neg Hx    Esophageal cancer Neg Hx    Stomach cancer Neg Hx    Pancreatic cancer Neg Hx     Social History: Social History   Socioeconomic History   Marital status: Married    Spouse name: Not on file   Number of children: Not on file   Years of education: Not on file   Highest education level: Not on file  Occupational History   Occupation: Automotive  Tobacco Use   Smoking status: Former    Packs/day: 1.50    Years: 24.00    Pack years: 36.00    Types: Cigarettes    Quit date: 06/06/1988    Years since quitting: 33.1   Smokeless tobacco: Former    Types: Snuff, Chew   Tobacco comments:    Quit chewing tobacco around 2000  Vaping Use   Vaping Use: Never used  Substance and Sexual Activity   Alcohol use: Yes    Alcohol/week: 7.0 standard drinks    Types: 7 Cans of beer per week    Comment: 7 beers per week   Drug use: No   Sexual activity: Yes  Other Topics Concern   Not on file  Social History Narrative   Originally from [place]. 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 No pets currently. No bird exposure.    Social Determinants of Health   Financial Resource Strain: Not on file  Food Insecurity: Not on file  Transportation Needs: Not on file  Physical Activity: Not on file  Stress: Not on file  Social Connections: Not on file    Allergies: No Known Allergies  Outpatient Meds: Current Outpatient Medications  Medication Sig Dispense Refill   celecoxib (CELEBREX) 100 MG capsule Take 1 capsule by mouth once daily 90 capsule 0   colchicine 0.6 MG tablet Take 1 tablet (0.6 mg total) by mouth daily. 30 tablet 0   losartan (COZAAR) 25 MG tablet Take 25 mg by mouth daily.     omeprazole (PRILOSEC) 20 MG capsule Take 20 mg by mouth daily.      PEG-KCl-NaCl-NaSulf-Na Asc-C (PLENVU) 140 g SOLR Take 140 g by mouth as directed. 1 each 0   predniSONE (DELTASONE) 5 MG tablet Take 5 mg by mouth daily as needed. Reported on 12/23/2015     sildenafil (VIAGRA) 100 MG tablet TAKE 1/2 TO 1 (ONE-HALF TO ONE) TABLET BY MOUTH ONCE DAILY AS NEEDED FOR ERECTILE DYSFUNCTION 6 tablet 0   traZODone (DESYREL) 50 MG tablet Take 50 mg by mouth at bedtime.     XARELTO 20 MG TABS tablet TAKE 1 TABLET BY MOUTH  DAILY 90 tablet 3   No current facility-administered medications for this visit.      ___________________________________________________________________ Objective   Exam:  BP 102/64    Pulse 96    Ht 5\' 11"  (1.803 m)    Wt 232 lb 8 oz (105.5 kg)    PF 95 L/min    BMI 32.43 kg/m  Wt Readings from Last 3 Encounters:  07/06/21 232 lb 8 oz (105.5 kg)  09/23/20 235 lb 9.6 oz (106.9 kg)  07/23/20 237 lb 9.6 oz (107.8 kg)    General: Well-appearing Eyes: sclera anicteric, no redness ENT: oral mucosa moist without lesions, no cervical or supraclavicular lymphadenopathy CV: RRR without murmur, S1/S2, no JVD, no peripheral edema Resp: clear to auscultation bilaterally, normal RR and effort noted GI: soft, no tenderness, with active bowel sounds. No guarding or palpable organomegaly noted. Skin; warm and dry, no rash or jaundice noted.  No spider nevi or palmar erythema Neuro: awake, alert and oriented x 3. Normal gross motor function and fluent speech  Labs:  CBC Latest Ref Rng & Units 09/23/2020 03/25/2020 03/20/2019  WBC 4.0 - 10.5 K/uL 4.3 3.9(L) 4.0  Hemoglobin 13.0 - 17.0 g/dL 17.7(H) 16.7 15.8  Hematocrit 39.0 - 52.0 % 53.3(H) 49.1 47.9  Platelets 150.0 - 400.0 K/uL 165.0 152.0 147(L)   CMP Latest Ref Rng & Units 09/23/2020 03/25/2020 03/20/2019  Glucose 70 - 99 mg/dL 87 88 96  BUN 6 - 23 mg/dL 14 15 14   Creatinine 0.40 - 1.50 mg/dL 1.26 1.11 1.38(H)  Sodium 135 - 145 mEq/L 139 140 142  Potassium 3.5 - 5.1 mEq/L 4.1 4.0 4.8  Chloride  96 - 112 mEq/L 101 102 104  CO2 19 - 32 mEq/L 29 30 29   Calcium 8.4 - 10.5 mg/dL 9.7 9.3 9.5  Total Protein 6.0 - 8.3 g/dL 6.8 6.3 6.5  Total Bilirubin 0.2 - 1.2 mg/dL 1.2 1.4(H) 0.9  Alkaline Phos 39 - 117 U/L 50 45 42  AST 0 - 37 U/L 110(H) 70(H) 74(H)  ALT 0 - 53 U/L 134(H) 108(H) 98(H)   Normal  iron studies January 2022    Assessment: Encounter Diagnoses  Name Primary?   Personal history of colonic polyps Yes   Long term (current) use of antithrombotics/antiplatelets    Fatty liver    LFTs abnormal     Due for surveillance colonoscopy to a significant personal history of colon polyps (and considering family history of colon cancer). He needs to take last dose of Xarelto n the morning the day prior to procedure,, and he has done this in the past for colonoscopy without difficulty.  We will message primary care to make sure they have no objection. He is understands the small but real risk of fluid being off his oral anticoagulation.  Regarding his fatty liver, we had a long discussion about the nature of the condition and the need for diet and lifestyle changes.  It was news to him that he should decrease carbohydrate intake, and I also recommended minimal alcohol use.  Although this may be largely metabolic in nature, even modest alcohol use could contribute as well. He has chronically borderline low platelet count, no clinical stigmata of cirrhosis.  However, I recommended he have a CT scan of the abdomen to evaluate for any morphologic changes of cirrhosis.  He considered, but wished to put that off so he could focus on diet and lifestyle changes and see if his liver labs improve after a few months.  Colonoscopy was scheduled and he was agreeable after discussion of procedure and risks.  The benefits and risks of the planned procedure were described in detail with the patient or (when appropriate) their health care proxy.  Risks were outlined as including, but not limited to,  bleeding, infection, perforation, adverse medication reaction leading to cardiac or pulmonary decompensation, pancreatitis (if ERCP).  The limitation of incomplete mucosal visualization was also discussed.  No guarantees or warranties were given.  Patient at increased risk for cardiopulmonary complications of procedure due to medical comorbidities.  Thank you for the courtesy of this consult.  Please call me with any questions or concerns.  Nelida Meuse III  CC: Referring provider noted above

## 2021-07-06 NOTE — Patient Instructions (Addendum)
If you are age 62 or older, your body mass index should be between 23-30. Your Body mass index is 32.43 kg/m. If this is out of the aforementioned range listed, please consider follow up with your Primary Care Provider.  If you are age 77 or younger, your body mass index should be between 19-25. Your Body mass index is 32.43 kg/m. If this is out of the aformentioned range listed, please consider follow up with your Primary Care Provider.   ________________________________________________________  The [facility] providers would like to encourage you to use [facility] to communicate with providers for non-urgent requests or questions.  Due to long hold times on the telephone, sending your provider a message by [facility] may be a faster and more efficient way to get a response.  Please allow 48 business hours for a response.  Please remember that this is for non-urgent requests.  _______________________________________________________  Nicholas [name] have been scheduled for a colonoscopy. Please follow written instructions given to you at your visit today.  Please pick up your prep supplies at the pharmacy within the next 1-3 days. If you use inhalers (even only as needed), please bring them with you on the day of your procedure.  You will be contacted by our office prior to your procedure for directions on holding your Xarelto.  If you do not hear from our office 1 week prior to your scheduled procedure, please call [phone] to discuss.    It was a pleasure to see you today!  Thank you for trusting me with your gastrointestinal care!

## 2021-07-08 ENCOUNTER — Telehealth: Payer: Self-pay

## 2021-07-08 NOTE — Telephone Encounter (Signed)
I am not sure what to do with this note/information.  If this is the procedure that Cardiology wants pt to follow, I am in agreement.  Did this come from Cards?  Pharmacy?  Or are you asking me if this is ok to proceed

## 2021-07-08 NOTE — Telephone Encounter (Deleted)
I am not sure what to do with this note/information.  If this is the procedure that Cardiology wants pt to follow, I am in agreement.  Did this come from Cards?  Pharmacy?  Or are you asking me if this is ok to proceed

## 2021-07-08 NOTE — Telephone Encounter (Signed)
[facility] Pre-operative Risk Assessment     Request for surgical clearance:     Endoscopy Procedure  What type of surgery is being performed?     Colonoscopy   When is this surgery scheduled?     08-26-2021  What type of clearance is required ?   Pharmacy  Are there any medications that need to be held prior to surgery and how long? Yes, Xarelto, 1 day   Practice name and name of physician performing surgery?      [facility]  What is your office phone and fax number?      Phone- [phone]  [phone]  Anesthesia type (None, local, MAC, general) ?       MAC

## 2021-07-09 NOTE — Telephone Encounter (Signed)
Pt has been notified and aware.  

## 2021-07-09 NOTE — Telephone Encounter (Signed)
Patient turned your call, please advise.

## 2021-07-09 NOTE — Telephone Encounter (Signed)
Ok to hold Xarelto the day prior to procedure

## 2021-07-09 NOTE — Telephone Encounter (Signed)
Pt has been notified and aware, no additional questions at the time.

## 2021-07-09 NOTE — Telephone Encounter (Signed)
Left message to return call 

## 2021-07-14 DIAGNOSIS — M9901 Segmental and somatic dysfunction of cervical region: Secondary | ICD-10-CM | POA: Diagnosis not present

## 2021-07-14 DIAGNOSIS — M4727 Other spondylosis with radiculopathy, lumbosacral region: Secondary | ICD-10-CM | POA: Diagnosis not present

## 2021-07-14 DIAGNOSIS — M4723 Other spondylosis with radiculopathy, cervicothoracic region: Secondary | ICD-10-CM | POA: Diagnosis not present

## 2021-07-14 DIAGNOSIS — M4728 Other spondylosis with radiculopathy, sacral and sacrococcygeal region: Secondary | ICD-10-CM | POA: Diagnosis not present

## 2021-07-28 DIAGNOSIS — M4727 Other spondylosis with radiculopathy, lumbosacral region: Secondary | ICD-10-CM | POA: Diagnosis not present

## 2021-07-28 DIAGNOSIS — M4723 Other spondylosis with radiculopathy, cervicothoracic region: Secondary | ICD-10-CM | POA: Diagnosis not present

## 2021-07-28 DIAGNOSIS — M9901 Segmental and somatic dysfunction of cervical region: Secondary | ICD-10-CM | POA: Diagnosis not present

## 2021-07-28 DIAGNOSIS — M4728 Other spondylosis with radiculopathy, sacral and sacrococcygeal region: Secondary | ICD-10-CM | POA: Diagnosis not present

## 2021-08-11 DIAGNOSIS — M4728 Other spondylosis with radiculopathy, sacral and sacrococcygeal region: Secondary | ICD-10-CM | POA: Diagnosis not present

## 2021-08-11 DIAGNOSIS — M4723 Other spondylosis with radiculopathy, cervicothoracic region: Secondary | ICD-10-CM | POA: Diagnosis not present

## 2021-08-11 DIAGNOSIS — M9901 Segmental and somatic dysfunction of cervical region: Secondary | ICD-10-CM | POA: Diagnosis not present

## 2021-08-11 DIAGNOSIS — M4727 Other spondylosis with radiculopathy, lumbosacral region: Secondary | ICD-10-CM | POA: Diagnosis not present

## 2021-08-12 ENCOUNTER — Other Ambulatory Visit: Payer: Self-pay | Admitting: Family Medicine

## 2021-08-19 ENCOUNTER — Encounter: Payer: Self-pay | Admitting: Gastroenterology

## 2021-08-26 ENCOUNTER — Other Ambulatory Visit: Payer: Self-pay

## 2021-08-26 ENCOUNTER — Ambulatory Visit (AMBULATORY_SURGERY_CENTER): Payer: BC Managed Care – PPO | Admitting: Gastroenterology

## 2021-08-26 ENCOUNTER — Encounter: Payer: BC Managed Care – PPO | Admitting: Gastroenterology

## 2021-08-26 ENCOUNTER — Encounter: Payer: Self-pay | Admitting: Gastroenterology

## 2021-08-26 VITALS — BP 141/78 | HR 61 | Temp 97.1°F | Resp 17 | Ht 71.0 in | Wt 232.0 lb

## 2021-08-26 DIAGNOSIS — D123 Benign neoplasm of transverse colon: Secondary | ICD-10-CM

## 2021-08-26 DIAGNOSIS — Z86010 Personal history of colonic polyps: Secondary | ICD-10-CM | POA: Diagnosis not present

## 2021-08-26 DIAGNOSIS — D120 Benign neoplasm of cecum: Secondary | ICD-10-CM

## 2021-08-26 DIAGNOSIS — Z1211 Encounter for screening for malignant neoplasm of colon: Secondary | ICD-10-CM | POA: Diagnosis not present

## 2021-08-26 MED ORDER — SODIUM CHLORIDE 0.9 % IV SOLN: 500.0000 mL | Freq: Once | INTRAVENOUS | Status: DC

## 2021-08-26 NOTE — Progress Notes (Signed)
Pt in recovery with monitors in place, VSS. Report given to receiving RN.  °

## 2021-08-26 NOTE — Progress Notes (Signed)
Called to room to assist during endoscopic procedure.  Patient ID and intended procedure confirmed with present staff. Received instructions for my participation in the procedure from the performing physician.  

## 2021-08-26 NOTE — Patient Instructions (Signed)
Handouts Provided:  Polyps ? ?RESUME Xarelto at prior dose tomorrow. ? ?YOU HAD AN ENDOSCOPIC PROCEDURE TODAY AT [facility]:   Refer to the procedure report that was given to you for any specific questions about what was found during the examination.  If the procedure report does not answer your questions, please call your gastroenterologist to clarify.  If you requested that your care partner not be given the details of your procedure findings, then the procedure report has been included in a sealed envelope for you to review at your convenience later. ? ?YOU SHOULD EXPECT: Some feelings of bloating in the abdomen. Passage of more gas than usual.  Walking can help get rid of the air that was put into your GI tract during the procedure and reduce the bloating. If you had a lower endoscopy (such as a colonoscopy or flexible sigmoidoscopy) you may notice spotting of blood in your stool or on the toilet paper. If you underwent a bowel prep for your procedure, you may not have a normal bowel movement for a few days. ? ?Please Note:  You might notice some irritation and congestion in your nose or some drainage.  This is from the oxygen used during your procedure.  There is no need for concern and it should clear up in a day or so. ? ?SYMPTOMS TO REPORT IMMEDIATELY: ? ?Following lower endoscopy (colonoscopy or flexible sigmoidoscopy): ? Excessive amounts of blood in the stool ? Significant tenderness or worsening of abdominal pains ? Swelling of the abdomen that is new, acute ? Fever of 100?F or higher ? ?For urgent or emergent issues, a gastroenterologist can be reached at any hour by calling [phone]. ?Do not use MyChart messaging for urgent concerns.  ? ? ?DIET:  We do recommend a small meal at first, but then you may proceed to your regular diet.  Drink plenty of fluids but you should avoid alcoholic beverages for 24 hours. ? ?ACTIVITY:  You should plan to take it easy for the rest of today and  you should NOT DRIVE or use heavy machinery until tomorrow (because of the sedation medicines used during the test).   ? ?FOLLOW UP: ?Our staff will call the number listed on your records 48-72 hours following your procedure to check on you and address any questions or concerns that you may have regarding the information given to you following your procedure. If we do not reach you, we will leave a message.  We will attempt to reach you two times.  During this call, we will ask if you have developed any symptoms of COVID 19. If you develop any symptoms (ie: fever, flu-like symptoms, shortness of breath, cough etc.) before then, please call [phone].  If you test positive for Covid 19 in the 2 weeks post procedure, please call and report this information to [place].   ? ?If any biopsies were taken you will be contacted by phone or by letter within the next 1-3 weeks.  Please call us at [phone] if you have not heard about the biopsies in 3 weeks.  ? ? ?SIGNATURES/CONFIDENTIALITY: ?You and/or your care partner have signed paperwork which will be entered into your electronic medical record.  These signatures attest to the fact that that the information above on your After Visit Summary has been reviewed and is understood.  Full responsibility of the confidentiality of this discharge information lies with you and/or your care-partner. ? ?

## 2021-08-26 NOTE — Progress Notes (Signed)
History and Physical: ? This patient presents for endoscopic testing for: ?Encounter Diagnosis  ?Name Primary?  ? Personal history of colonic polyps Yes  ? ? ?Clinical details in 07/06/21 [facility] office note ?Patient has been off [place] prior to procedure. ? ?ROS: ?Patient denies chest pain or shortness of breath ? ? ?Past Medical History: ?Past Medical History:  ?Diagnosis Date  ? Arthritis   ? "my whole body"  ? Chronic back pain   ? "mainly lower; some in top too" (03/26/2014)  ? DVT (deep venous thrombosis) ([place])   ? GERD (gastroesophageal reflux disease)   ? History of gout   ? Hypertension   ? Pulmonary embolism ([place])   ? Recurrent & unprovoked August 2016. Provoked/Post-op December 2015  ? ? ? ?Past Surgical History: ?Past Surgical History:  ?Procedure Laterality Date  ? BACK SURGERY    ? CARPAL TUNNEL RELEASE Bilateral 2012  ? COLONOSCOPY    ? DECOMPRESSIVE LUMBAR LAMINECTOMY LEVEL 3 N/A 03/26/2014  ? Procedure: L3-S1 DECOMPRESSION;  Surgeon: Melina Schools, MD;  Location: [facility];  Service: Orthopedics;  Laterality: N/A;  ? INGUINAL HERNIA REPAIR Right 2000's  ? LUMBAR LAMINECTOMY/DECOMPRESSION MICRODISCECTOMY  03/26/2014  ? L3-S1  ? lytic therapy  August 2016  ? For Lower Extremity DVT  ? ? ?Allergies: ?No Known Allergies ? ?Outpatient Meds: ?Current Outpatient Medications  ?Medication Sig Dispense Refill  ? celecoxib (CELEBREX) 100 MG capsule Take 1 capsule by mouth once daily 90 capsule 0  ? colchicine 0.6 MG tablet Take 1 tablet by mouth once daily 30 tablet 0  ? losartan (COZAAR) 25 MG tablet Take 25 mg by mouth daily.    ? omeprazole (PRILOSEC) 20 MG capsule Take 20 mg by mouth daily.    ? predniSONE (DELTASONE) 5 MG tablet Take 5 mg by mouth daily as needed. Reported on 12/23/2015 (Patient not taking: Reported on 08/26/2021)    ? sildenafil (VIAGRA) 100 MG tablet TAKE 1/2 TO 1 (ONE-HALF TO ONE) TABLET BY MOUTH ONCE DAILY AS NEEDED FOR ERECTILE DYSFUNCTION (Patient not taking: Reported on 08/26/2021) 6 tablet 0  ?  traZODone (DESYREL) 50 MG tablet Take 50 mg by mouth at bedtime. (Patient not taking: Reported on 08/26/2021)    ? XARELTO 20 MG TABS tablet TAKE 1 TABLET BY MOUTH  DAILY 90 tablet 3  ? ?Current Facility-Administered Medications  ?Medication Dose Route Frequency Provider Last Rate Last Admin  ? 0.9 %  sodium chloride infusion  500 mL Intravenous Once Doran Stabler, MD      ? ? ? ? ?___________________________________________________________________ ?Objective  ? ?Exam: ? ?BP (!) 157/106   Pulse 66   Temp (!) 97.1 ?F (36.2 ?C)   Ht '5\' 11"'$  (1.803 m)   Wt 232 lb (105.2 kg)   SpO2 96%   BMI 32.36 kg/m?  ? ?CV: RRR without murmur, S1/S2 ?Resp: clear to auscultation bilaterally, normal RR and effort noted ?GI: soft, no tenderness, with active bowel sounds. ? ? ?Assessment: ?Encounter Diagnosis  ?Name Primary?  ? Personal history of colonic polyps Yes  ? ? ? ?Plan: ?Colonoscopy ? ? ?The patient is appropriate for an endoscopic procedure in the ambulatory setting. ? ? - Wilfrid Lund, MD ? ? ? ? ?

## 2021-08-26 NOTE — Op Note (Signed)
[facility] ?Patient Name: Nicholas [name] ?Procedure Date: 08/26/2021 9:24 AM ?MRN: [id number] ?Endoscopist: Estill Cotta. Loletha Carrow , MD ?Age: 61 ?Referring MD:  ?Date of Birth: [birth year] ?Gender: Male ?Account #: [id number] ?Procedure:                Colonoscopy ?Indications:              Surveillance: History of numerous (> 10) adenomas  ?                          on last colonoscopy (< 3 yrs) ?                          TA x 19 Jun 2020; TA x 5 in 2017 ?Medicines:                Monitored Anesthesia Care ?Procedure:                Pre-Anesthesia Assessment: ?                          - Prior to the procedure, a History and Physical  ?                          was performed, and patient medications and  ?                          allergies were reviewed. The patient's tolerance of  ?                          previous anesthesia was also reviewed. The risks  ?                          and benefits of the procedure and the sedation  ?                          options and risks were discussed with the patient.  ?                          All questions were answered, and informed consent  ?                          was obtained. Prior Anticoagulants: The patient has  ?                          taken Xarelto (rivaroxaban), last dose was 2 days  ?                          prior to procedure. ASA Grade Assessment: III - A  ?                          patient with severe systemic disease. After  ?                          reviewing the risks and benefits, the patient was  ?  deemed in satisfactory condition to undergo the  ?                          procedure. ?                          After obtaining informed consent, the colonoscope  ?                          was passed under direct vision. Throughout the  ?                          procedure, the patient's blood pressure, pulse, and  ?                          oxygen saturations were monitored continuously. The  ?                          CF  [id number] #[id number] was introduced through the anus  ?                          and advanced to the the cecum, identified by  ?                          appendiceal orifice and ileocecal valve. The  ?                          colonoscopy was performed without difficulty. The  ?                          patient tolerated the procedure well. The quality  ?                          of the bowel preparation was good. The ileocecal  ?                          valve, appendiceal orifice, and rectum were  ?                          photographed. The bowel preparation used was Plenvu. ?Scope In: 9:35:45 AM ?Scope Out: 9:53:25 AM ?Scope Withdrawal Time: 0 hours 12 minutes 52 seconds  ?Total Procedure Duration: 0 hours 17 minutes 40 seconds  ?Findings:                 The perianal and digital rectal examinations were  ?                          normal. ?                          Repeat examination of right colon under NBI  ?                          performed. ?  An 8-10 mm polyp was found in the cecum. The polyp  ?                          was semi-sessile. The polyp was removed with a cold  ?                          snare. Resection and retrieval were complete. (Jar  ?                          1) ?                          A 4 mm polyp was found in the proximal transverse  ?                          colon. The polyp was sessile. The polyp was removed  ?                          with a cold snare. Resection and retrieval were  ?                          complete. (Jar 2) ?                          Internal hemorrhoids were found. ?                          The exam was otherwise without abnormality on  ?                          direct and retroflexion views. ?Complications:            No immediate complications. ?Estimated Blood Loss:     Estimated blood loss was minimal. ?Impression:               - One 8-10 mm polyp in the cecum, removed with a  ?                          cold snare. Resected and  retrieved. ?                          - One 4 mm polyp in the proximal transverse colon,  ?                          removed with a cold snare. Resected and retrieved. ?                          - Internal hemorrhoids. ?                          - The examination was otherwise normal on direct  ?                          and retroflexion views. ?Recommendation:           -  Patient has a contact number available for  ?                          emergencies. The signs and symptoms of potential  ?                          delayed complications were discussed with the  ?                          patient. Return to normal activities tomorrow.  ?                          Written discharge instructions were provided to the  ?                          patient. ?                          - Resume previous diet. ?                          - Continue present medications. ?                          - Resume Xarelto (rivaroxaban) at prior dose  ?                          tomorrow. ?                          - Await pathology results. ?                          - Repeat colonoscopy in 2 years for surveillance. ?Ronalee Scheunemann L. Loletha Carrow, MD ?08/26/2021 9:58:21 AM ?This report has been signed electronically. ?

## 2021-08-30 ENCOUNTER — Encounter: Payer: Self-pay | Admitting: Gastroenterology

## 2021-08-30 ENCOUNTER — Telehealth: Payer: Self-pay | Admitting: *Deleted

## 2021-08-30 NOTE — Telephone Encounter (Signed)
No answer on first attempt follow up call. Left message.  ?

## 2021-08-30 NOTE — Telephone Encounter (Signed)
No answer on second attempt follow up call.  ? ?

## 2021-09-01 DIAGNOSIS — M4728 Other spondylosis with radiculopathy, sacral and sacrococcygeal region: Secondary | ICD-10-CM | POA: Diagnosis not present

## 2021-09-01 DIAGNOSIS — M9901 Segmental and somatic dysfunction of cervical region: Secondary | ICD-10-CM | POA: Diagnosis not present

## 2021-09-01 DIAGNOSIS — M4727 Other spondylosis with radiculopathy, lumbosacral region: Secondary | ICD-10-CM | POA: Diagnosis not present

## 2021-09-01 DIAGNOSIS — M4723 Other spondylosis with radiculopathy, cervicothoracic region: Secondary | ICD-10-CM | POA: Diagnosis not present

## 2021-09-17 ENCOUNTER — Other Ambulatory Visit: Payer: Self-pay | Admitting: Family Medicine

## 2021-09-22 DIAGNOSIS — M4723 Other spondylosis with radiculopathy, cervicothoracic region: Secondary | ICD-10-CM | POA: Diagnosis not present

## 2021-09-22 DIAGNOSIS — M4728 Other spondylosis with radiculopathy, sacral and sacrococcygeal region: Secondary | ICD-10-CM | POA: Diagnosis not present

## 2021-09-22 DIAGNOSIS — M9901 Segmental and somatic dysfunction of cervical region: Secondary | ICD-10-CM | POA: Diagnosis not present

## 2021-09-22 DIAGNOSIS — M4727 Other spondylosis with radiculopathy, lumbosacral region: Secondary | ICD-10-CM | POA: Diagnosis not present

## 2021-10-01 ENCOUNTER — Ambulatory Visit: Payer: BC Managed Care – PPO | Admitting: Family Medicine

## 2021-10-01 ENCOUNTER — Encounter: Payer: Self-pay | Admitting: Family Medicine

## 2021-10-01 ENCOUNTER — Other Ambulatory Visit: Payer: Self-pay

## 2021-10-01 VITALS — BP 140/76 | HR 73 | Temp 97.6°F | Resp 18 | Ht 71.0 in | Wt 230.6 lb

## 2021-10-01 DIAGNOSIS — E6609 Other obesity due to excess calories: Secondary | ICD-10-CM | POA: Diagnosis not present

## 2021-10-01 DIAGNOSIS — Z86711 Personal history of pulmonary embolism: Secondary | ICD-10-CM

## 2021-10-01 DIAGNOSIS — I10 Essential (primary) hypertension: Secondary | ICD-10-CM

## 2021-10-01 DIAGNOSIS — Z6832 Body mass index (BMI) 32.0-32.9, adult: Secondary | ICD-10-CM

## 2021-10-01 DIAGNOSIS — D6862 Lupus anticoagulant syndrome: Secondary | ICD-10-CM | POA: Diagnosis not present

## 2021-10-01 MED ORDER — PREDNISONE 5 MG PO TABS: 5.0000 mg | ORAL_TABLET | Freq: Every day | ORAL | 3 refills | Status: DC | PRN

## 2021-10-01 MED ORDER — SILDENAFIL CITRATE 100 MG PO TABS: ORAL_TABLET | ORAL | 3 refills | Status: DC

## 2021-10-01 MED ORDER — CELECOXIB 100 MG PO CAPS: 100.0000 mg | ORAL_CAPSULE | Freq: Every day | ORAL | 1 refills | Status: DC

## 2021-10-01 MED ORDER — COLCHICINE 0.6 MG PO TABS: 0.6000 mg | ORAL_TABLET | Freq: Every day | ORAL | 2 refills | Status: DC

## 2021-10-01 MED ORDER — RIVAROXABAN 20 MG PO TABS: 20.0000 mg | ORAL_TABLET | Freq: Every day | ORAL | 3 refills | Status: DC

## 2021-10-01 NOTE — Progress Notes (Signed)
? ?  Subjective:  ? ? Patient ID: Nicholas [name], male    DOB: 1960-10-30, 61 y.o.   MRN: [id number] ? ?HPI ?HTN- chronic problem, on Losartan '25mg'$  daily.  Pt reports BP will be slightly elevated while at the doctor.  No CP, SOB, HAs, visual changes, edema. ? ?Obesity- pt's BMI 32.16.  no regular exercise.  Not following a particular diet but trying to 'lay off the bread and potatoes'.   ? ?Hx of DVT/PE due to lupus anticoagulant- chronic problem, on life long xarelto.  Does have easier bleeding and bruising but no major bleeds. ? ? ?Review of Systems ?For ROS see HPI  ?   ?Objective:  ? Physical Exam ?Vitals reviewed.  ?Constitutional:   ?   General: He is not in acute distress. ?   Appearance: Normal appearance. He is well-developed. He is obese. He is not ill-appearing.  ?HENT:  ?   Head: Normocephalic and atraumatic.  ?Eyes:  ?   Extraocular Movements: Extraocular movements intact.  ?   Conjunctiva/sclera: Conjunctivae normal.  ?   Pupils: Pupils are equal, round, and reactive to light.  ?Neck:  ?   Thyroid: No thyromegaly.  ?Cardiovascular:  ?   Rate and Rhythm: Normal rate and regular rhythm.  ?   Pulses: Normal pulses.  ?   Heart sounds: Normal heart sounds. No murmur heard. ?Pulmonary:  ?   Effort: Pulmonary effort is normal. No respiratory distress.  ?   Breath sounds: Normal breath sounds.  ?Abdominal:  ?   General: Bowel sounds are normal. There is no distension.  ?   Palpations: Abdomen is soft.  ?Musculoskeletal:  ?   Cervical back: Normal range of motion and neck supple.  ?   Right lower leg: No edema.  ?   Left lower leg: No edema.  ?Lymphadenopathy:  ?   Cervical: No cervical adenopathy.  ?Skin: ?   General: Skin is warm and dry.  ?Neurological:  ?   General: No focal deficit present.  ?   Mental Status: He is alert and oriented to person, place, and time.  ?   Cranial Nerves: No cranial nerve deficit.  ?Psychiatric:     ?   Mood and Affect: Mood normal.     ?   Behavior: Behavior normal.  ? ? ? ? ? ?    ?Assessment & Plan:  ? ? ?

## 2021-10-01 NOTE — Assessment & Plan Note (Signed)
BMI 32.16  He reports trying to limit carbs but no regular exercise.  Encouraged healthy diet and regular physical activity.  Check labs to risk stratify.  Will follow. ?

## 2021-10-01 NOTE — Assessment & Plan Note (Signed)
Pt now on lifelong anticoagulation.  Refill provided for Xarelto. ?

## 2021-10-01 NOTE — Assessment & Plan Note (Signed)
Chronic problem.  Currently on Losartan '25mg'$  daily.  BP is mildly elevated but he reports this is pretty standard for when he goes to the doctor.  Will check labs due to ARB but no anticipated med changes at this time. ?

## 2021-10-01 NOTE — Patient Instructions (Signed)
Please schedule a lab visit at your convenience ?Schedule your complete physical in 6 months ?We'll notify you of your lab results and make any changes if needed ?Continue to work on healthy diet and regular exercise- you can do it! ?Call with any questions or concerns ?Stay Safe!  Stay Healthy!!! ?

## 2021-10-06 ENCOUNTER — Other Ambulatory Visit (INDEPENDENT_AMBULATORY_CARE_PROVIDER_SITE_OTHER): Payer: BC Managed Care – PPO

## 2021-10-06 ENCOUNTER — Other Ambulatory Visit: Payer: Self-pay | Admitting: Family Medicine

## 2021-10-06 DIAGNOSIS — I10 Essential (primary) hypertension: Secondary | ICD-10-CM

## 2021-10-06 LAB — CBC WITH DIFFERENTIAL/PLATELET
Basophils Absolute: 0 10*3/uL (ref 0.0–0.1)
Basophils Relative: 0.3 % (ref 0.0–3.0)
Eosinophils Absolute: 0.4 10*3/uL (ref 0.0–0.7)
Eosinophils Relative: 9.9 % — ABNORMAL HIGH (ref 0.0–5.0)
HCT: 46.4 % (ref 39.0–52.0)
Hemoglobin: 15.6 g/dL (ref 13.0–17.0)
Lymphocytes Relative: 35.5 % (ref 12.0–46.0)
Lymphs Abs: 1.4 10*3/uL (ref 0.7–4.0)
MCHC: 33.6 g/dL (ref 30.0–36.0)
MCV: 97.3 fl (ref 78.0–100.0)
Monocytes Absolute: 0.5 10*3/uL (ref 0.1–1.0)
Monocytes Relative: 12.8 % — ABNORMAL HIGH (ref 3.0–12.0)
Neutro Abs: 1.7 10*3/uL (ref 1.4–7.7)
Neutrophils Relative %: 41.5 % — ABNORMAL LOW (ref 43.0–77.0)
Platelets: 133 10*3/uL — ABNORMAL LOW (ref 150.0–400.0)
RBC: 4.77 Mil/uL (ref 4.22–5.81)
RDW: 14.7 % (ref 11.5–15.5)
WBC: 4 10*3/uL (ref 4.0–10.5)

## 2021-10-06 LAB — LIPID PANEL
Cholesterol: 149 mg/dL (ref 0–200)
HDL: 92 mg/dL (ref >39.00)
LDL Cholesterol: 51 mg/dL (ref 0–99)
NonHDL: 57.38
Total CHOL/HDL Ratio: 2
Triglycerides: 30 mg/dL (ref 0.0–149.0)
VLDL: 6 mg/dL (ref 0.0–40.0)

## 2021-10-06 LAB — HEPATIC FUNCTION PANEL
ALT: 123 U/L — ABNORMAL HIGH (ref 0–53)
AST: 97 U/L — ABNORMAL HIGH (ref 0–37)
Albumin: 4.1 g/dL (ref 3.5–5.2)
Alkaline Phosphatase: 55 U/L (ref 39–117)
Bilirubin, Direct: 0.4 mg/dL — ABNORMAL HIGH (ref 0.0–0.3)
Total Bilirubin: 1.5 mg/dL — ABNORMAL HIGH (ref 0.2–1.2)
Total Protein: 6.1 g/dL (ref 6.0–8.3)

## 2021-10-06 LAB — BASIC METABOLIC PANEL
BUN: 16 mg/dL (ref 6–23)
CO2: 28 mEq/L (ref 19–32)
Calcium: 8.8 mg/dL (ref 8.4–10.5)
Chloride: 104 mEq/L (ref 96–112)
Creatinine, Ser: 1.05 mg/dL (ref 0.40–1.50)
GFR: 76.56 mL/min (ref >60.00)
Glucose, Bld: 82 mg/dL (ref 70–99)
Potassium: 3.9 mEq/L (ref 3.5–5.1)
Sodium: 139 mEq/L (ref 135–145)

## 2021-10-06 LAB — TSH: TSH: 2.84 u[IU]/mL (ref 0.35–5.50)

## 2021-10-06 NOTE — Progress Notes (Signed)
Labs ordered for upcoming visit

## 2021-10-07 ENCOUNTER — Telehealth: Payer: Self-pay

## 2021-10-07 NOTE — Telephone Encounter (Signed)
-----   Message from Midge Minium, MD sent at 10/07/2021  7:35 AM EDT ----- ?Labs look good!  Liver functions are again elevated but thankfully stable.  No changes at this time ?

## 2021-10-07 NOTE — Telephone Encounter (Signed)
Left pt a VM to call the office in regards to lab results  

## 2021-10-08 NOTE — Telephone Encounter (Signed)
Patient is aware of lab results, seen them through mychart.  ?

## 2021-10-13 DIAGNOSIS — M4723 Other spondylosis with radiculopathy, cervicothoracic region: Secondary | ICD-10-CM | POA: Diagnosis not present

## 2021-10-13 DIAGNOSIS — M4728 Other spondylosis with radiculopathy, sacral and sacrococcygeal region: Secondary | ICD-10-CM | POA: Diagnosis not present

## 2021-10-13 DIAGNOSIS — M9901 Segmental and somatic dysfunction of cervical region: Secondary | ICD-10-CM | POA: Diagnosis not present

## 2021-10-13 DIAGNOSIS — M4727 Other spondylosis with radiculopathy, lumbosacral region: Secondary | ICD-10-CM | POA: Diagnosis not present

## 2021-11-09 DIAGNOSIS — M9901 Segmental and somatic dysfunction of cervical region: Secondary | ICD-10-CM | POA: Diagnosis not present

## 2021-11-09 DIAGNOSIS — M4727 Other spondylosis with radiculopathy, lumbosacral region: Secondary | ICD-10-CM | POA: Diagnosis not present

## 2021-11-09 DIAGNOSIS — M4723 Other spondylosis with radiculopathy, cervicothoracic region: Secondary | ICD-10-CM | POA: Diagnosis not present

## 2021-11-09 DIAGNOSIS — M4728 Other spondylosis with radiculopathy, sacral and sacrococcygeal region: Secondary | ICD-10-CM | POA: Diagnosis not present

## 2021-12-01 DIAGNOSIS — M4723 Other spondylosis with radiculopathy, cervicothoracic region: Secondary | ICD-10-CM | POA: Diagnosis not present

## 2021-12-01 DIAGNOSIS — M4727 Other spondylosis with radiculopathy, lumbosacral region: Secondary | ICD-10-CM | POA: Diagnosis not present

## 2021-12-01 DIAGNOSIS — M4728 Other spondylosis with radiculopathy, sacral and sacrococcygeal region: Secondary | ICD-10-CM | POA: Diagnosis not present

## 2021-12-01 DIAGNOSIS — M9901 Segmental and somatic dysfunction of cervical region: Secondary | ICD-10-CM | POA: Diagnosis not present

## 2021-12-22 DIAGNOSIS — M4728 Other spondylosis with radiculopathy, sacral and sacrococcygeal region: Secondary | ICD-10-CM | POA: Diagnosis not present

## 2021-12-22 DIAGNOSIS — M4723 Other spondylosis with radiculopathy, cervicothoracic region: Secondary | ICD-10-CM | POA: Diagnosis not present

## 2021-12-22 DIAGNOSIS — M4727 Other spondylosis with radiculopathy, lumbosacral region: Secondary | ICD-10-CM | POA: Diagnosis not present

## 2021-12-22 DIAGNOSIS — M9901 Segmental and somatic dysfunction of cervical region: Secondary | ICD-10-CM | POA: Diagnosis not present

## 2022-01-12 DIAGNOSIS — M4723 Other spondylosis with radiculopathy, cervicothoracic region: Secondary | ICD-10-CM | POA: Diagnosis not present

## 2022-01-12 DIAGNOSIS — M4728 Other spondylosis with radiculopathy, sacral and sacrococcygeal region: Secondary | ICD-10-CM | POA: Diagnosis not present

## 2022-01-12 DIAGNOSIS — M9901 Segmental and somatic dysfunction of cervical region: Secondary | ICD-10-CM | POA: Diagnosis not present

## 2022-01-12 DIAGNOSIS — M4727 Other spondylosis with radiculopathy, lumbosacral region: Secondary | ICD-10-CM | POA: Diagnosis not present

## 2022-01-21 DIAGNOSIS — I10 Essential (primary) hypertension: Secondary | ICD-10-CM | POA: Diagnosis not present

## 2022-01-21 DIAGNOSIS — R7401 Elevation of levels of liver transaminase levels: Secondary | ICD-10-CM | POA: Diagnosis not present

## 2022-01-21 DIAGNOSIS — F1010 Alcohol abuse, uncomplicated: Secondary | ICD-10-CM | POA: Diagnosis not present

## 2022-01-21 DIAGNOSIS — M109 Gout, unspecified: Secondary | ICD-10-CM | POA: Diagnosis not present

## 2022-02-02 DIAGNOSIS — M4723 Other spondylosis with radiculopathy, cervicothoracic region: Secondary | ICD-10-CM | POA: Diagnosis not present

## 2022-02-02 DIAGNOSIS — M4728 Other spondylosis with radiculopathy, sacral and sacrococcygeal region: Secondary | ICD-10-CM | POA: Diagnosis not present

## 2022-02-02 DIAGNOSIS — M9901 Segmental and somatic dysfunction of cervical region: Secondary | ICD-10-CM | POA: Diagnosis not present

## 2022-02-02 DIAGNOSIS — M4727 Other spondylosis with radiculopathy, lumbosacral region: Secondary | ICD-10-CM | POA: Diagnosis not present

## 2022-02-09 DIAGNOSIS — M9901 Segmental and somatic dysfunction of cervical region: Secondary | ICD-10-CM | POA: Diagnosis not present

## 2022-02-09 DIAGNOSIS — M4728 Other spondylosis with radiculopathy, sacral and sacrococcygeal region: Secondary | ICD-10-CM | POA: Diagnosis not present

## 2022-02-09 DIAGNOSIS — M4723 Other spondylosis with radiculopathy, cervicothoracic region: Secondary | ICD-10-CM | POA: Diagnosis not present

## 2022-02-09 DIAGNOSIS — M4727 Other spondylosis with radiculopathy, lumbosacral region: Secondary | ICD-10-CM | POA: Diagnosis not present

## 2022-02-16 DIAGNOSIS — M4723 Other spondylosis with radiculopathy, cervicothoracic region: Secondary | ICD-10-CM | POA: Diagnosis not present

## 2022-02-16 DIAGNOSIS — M4727 Other spondylosis with radiculopathy, lumbosacral region: Secondary | ICD-10-CM | POA: Diagnosis not present

## 2022-02-16 DIAGNOSIS — M9901 Segmental and somatic dysfunction of cervical region: Secondary | ICD-10-CM | POA: Diagnosis not present

## 2022-02-16 DIAGNOSIS — M4728 Other spondylosis with radiculopathy, sacral and sacrococcygeal region: Secondary | ICD-10-CM | POA: Diagnosis not present

## 2022-02-24 DIAGNOSIS — M9901 Segmental and somatic dysfunction of cervical region: Secondary | ICD-10-CM | POA: Diagnosis not present

## 2022-02-24 DIAGNOSIS — M4728 Other spondylosis with radiculopathy, sacral and sacrococcygeal region: Secondary | ICD-10-CM | POA: Diagnosis not present

## 2022-02-24 DIAGNOSIS — M4723 Other spondylosis with radiculopathy, cervicothoracic region: Secondary | ICD-10-CM | POA: Diagnosis not present

## 2022-02-24 DIAGNOSIS — M4727 Other spondylosis with radiculopathy, lumbosacral region: Secondary | ICD-10-CM | POA: Diagnosis not present

## 2022-03-12 ENCOUNTER — Other Ambulatory Visit: Payer: Self-pay | Admitting: Family Medicine

## 2022-03-23 DIAGNOSIS — M4723 Other spondylosis with radiculopathy, cervicothoracic region: Secondary | ICD-10-CM | POA: Diagnosis not present

## 2022-03-23 DIAGNOSIS — M4728 Other spondylosis with radiculopathy, sacral and sacrococcygeal region: Secondary | ICD-10-CM | POA: Diagnosis not present

## 2022-03-23 DIAGNOSIS — M9901 Segmental and somatic dysfunction of cervical region: Secondary | ICD-10-CM | POA: Diagnosis not present

## 2022-03-23 DIAGNOSIS — M4727 Other spondylosis with radiculopathy, lumbosacral region: Secondary | ICD-10-CM | POA: Diagnosis not present

## 2022-03-30 DIAGNOSIS — M4723 Other spondylosis with radiculopathy, cervicothoracic region: Secondary | ICD-10-CM | POA: Diagnosis not present

## 2022-03-30 DIAGNOSIS — M9901 Segmental and somatic dysfunction of cervical region: Secondary | ICD-10-CM | POA: Diagnosis not present

## 2022-03-30 DIAGNOSIS — M4728 Other spondylosis with radiculopathy, sacral and sacrococcygeal region: Secondary | ICD-10-CM | POA: Diagnosis not present

## 2022-03-30 DIAGNOSIS — M4727 Other spondylosis with radiculopathy, lumbosacral region: Secondary | ICD-10-CM | POA: Diagnosis not present

## 2022-04-04 DIAGNOSIS — M4727 Other spondylosis with radiculopathy, lumbosacral region: Secondary | ICD-10-CM | POA: Diagnosis not present

## 2022-04-04 DIAGNOSIS — M4723 Other spondylosis with radiculopathy, cervicothoracic region: Secondary | ICD-10-CM | POA: Diagnosis not present

## 2022-04-04 DIAGNOSIS — M9901 Segmental and somatic dysfunction of cervical region: Secondary | ICD-10-CM | POA: Diagnosis not present

## 2022-04-04 DIAGNOSIS — M4728 Other spondylosis with radiculopathy, sacral and sacrococcygeal region: Secondary | ICD-10-CM | POA: Diagnosis not present

## 2022-04-05 DIAGNOSIS — M9901 Segmental and somatic dysfunction of cervical region: Secondary | ICD-10-CM | POA: Diagnosis not present

## 2022-04-05 DIAGNOSIS — M4723 Other spondylosis with radiculopathy, cervicothoracic region: Secondary | ICD-10-CM | POA: Diagnosis not present

## 2022-04-05 DIAGNOSIS — M4727 Other spondylosis with radiculopathy, lumbosacral region: Secondary | ICD-10-CM | POA: Diagnosis not present

## 2022-04-05 DIAGNOSIS — M4728 Other spondylosis with radiculopathy, sacral and sacrococcygeal region: Secondary | ICD-10-CM | POA: Diagnosis not present

## 2022-04-06 ENCOUNTER — Encounter: Payer: Self-pay | Admitting: Family Medicine

## 2022-04-06 ENCOUNTER — Ambulatory Visit (INDEPENDENT_AMBULATORY_CARE_PROVIDER_SITE_OTHER): Payer: BC Managed Care – PPO | Admitting: Family Medicine

## 2022-04-06 VITALS — BP 140/96 | HR 86 | Temp 98.9°F | Resp 18 | Ht 71.0 in | Wt 232.5 lb

## 2022-04-06 DIAGNOSIS — Z0000 Encounter for general adult medical examination without abnormal findings: Secondary | ICD-10-CM | POA: Diagnosis not present

## 2022-04-06 DIAGNOSIS — Z125 Encounter for screening for malignant neoplasm of prostate: Secondary | ICD-10-CM

## 2022-04-06 DIAGNOSIS — M48061 Spinal stenosis, lumbar region without neurogenic claudication: Secondary | ICD-10-CM

## 2022-04-06 DIAGNOSIS — Z23 Encounter for immunization: Secondary | ICD-10-CM

## 2022-04-06 DIAGNOSIS — I10 Essential (primary) hypertension: Secondary | ICD-10-CM

## 2022-04-06 MED ORDER — CELECOXIB 100 MG PO CAPS: 100.0000 mg | ORAL_CAPSULE | Freq: Two times a day (BID) | ORAL | 1 refills | Status: DC

## 2022-04-06 MED ORDER — COLCHICINE 0.6 MG PO TABS: 0.6000 mg | ORAL_TABLET | Freq: Every day | ORAL | 2 refills | Status: DC

## 2022-04-06 NOTE — Assessment & Plan Note (Signed)
Ongoing issue for pt.  Having daily pain.  Currently only seeing a chiropractor w/o relief.  Refer to neurosurg placed.

## 2022-04-06 NOTE — Assessment & Plan Note (Signed)
Deteriorated.  BP is elevated today.  Reports it has been recently.  Will increase Losartan to '50mg'$  daily.  He gets medication through [place] so will take 2 of what he has.

## 2022-04-06 NOTE — Progress Notes (Signed)
   Subjective:    Patient ID: Nicholas [name], male    DOB: [birth year], 61 y.o.   MRN: [id number]  HPI CPE- UTD on Tdap, colonoscopy.  Pt reports BP has typically been running 'a little high' and is worse at the doctor.    Patient Care Team    Relationship Specialty Notifications Start End  Midge Minium, MD PCP - General Family Medicine  04/30/19   Doran Stabler, MD Consulting Physician Gastroenterology  04/30/19   [name], MD Consulting Physician Hematology  04/30/19     Health Maintenance  Topic Date Due   INFLUENZA VACCINE  01/04/2022   HIV Screening  10/02/2022 (Originally 02/24/1975)   TETANUS/TDAP  04/30/2023   COLONOSCOPY (Pts 45-56yr Insurance coverage will need to be confirmed)  08/27/2023   Hepatitis C Screening  Completed   HPV VACCINES  Aged Out   COVID-19 Vaccine  Discontinued   Zoster Vaccines- Shingrix  Discontinued      Review of Systems Patient reports no vision/hearing changes, anorexia, fever ,adenopathy, persistant/recurrent hoarseness, swallowing issues, chest pain, palpitations, edema, persistant/recurrent cough, hemoptysis, dyspnea (rest,exertional, paroxysmal nocturnal), gastrointestinal  bleeding (melena, rectal bleeding), abdominal pain, excessive heart burn, GU symptoms (dysuria, hematuria, voiding/incontinence issues) syncope, focal weakness, memory loss, numbness & tingling, skin/hair/nail changes, depression, anxiety, abnormal bruising/bleeding, musculoskeletal symptoms/signs.     Objective:   Physical Exam General Appearance:    Alert, cooperative, no distress, appears younger than age, obese  Head:    Normocephalic, without obvious abnormality, atraumatic  Eyes:    PERRL, conjunctiva/corneas clear, EOM's intact both eyes       Ears:    Normal TM's and external ear canals, both ears  Nose:   Nares normal, septum midline, mucosa normal, no drainage   or sinus tenderness  Throat:   Lips, mucosa, and tongue normal; teeth and gums normal   Neck:   Supple, symmetrical, trachea midline, no adenopathy;       thyroid:  No enlargement/tenderness/nodules  Back:     Symmetric, no curvature, ROM normal, no CVA tenderness  Lungs:     Clear to auscultation bilaterally, respirations unlabored  Chest wall:    No tenderness or deformity  Heart:    Regular rate and rhythm, S1 and S2 normal, no murmur, rub   or gallop  Abdomen:     Soft, non-tender, bowel sounds active all four quadrants,    no masses, no organomegaly  Genitalia:    Deferred  Rectal:    Extremities:   Extremities normal, atraumatic, no cyanosis or edema  Pulses:   2+ and symmetric all extremities  Skin:   Skin color, texture, turgor normal, no rashes or lesions  Lymph nodes:   Cervical, supraclavicular, and axillary nodes normal  Neurologic:   CNII-XII intact. Normal strength, sensation and reflexes      throughout          Assessment & Plan:

## 2022-04-06 NOTE — Patient Instructions (Signed)
Follow up in 6 months to recheck BP We'll notify you of your lab results and make any changes if needed INCREASE the Celebrex to twice daily- take w/ food INCREASE the Losartan to '50mg'$  daily- 2 of what you have at home [facility] call you to schedule your Neurosurgery appt for the back Call with any questions or concerns Stay Safe!  Stay Healthy! Happy Fall!!!

## 2022-04-06 NOTE — Assessment & Plan Note (Signed)
Pt's PE WNL w/ exception of obesity.  UTD on colonoscopy, Tdap.  Flu shot given.  Check labs.  Anticipatory guidance provided.

## 2022-04-07 ENCOUNTER — Telehealth: Payer: Self-pay | Admitting: Lab

## 2022-04-07 ENCOUNTER — Telehealth: Payer: Self-pay | Admitting: Family Medicine

## 2022-04-07 LAB — HEPATIC FUNCTION PANEL
ALT: 108 U/L — ABNORMAL HIGH (ref 0–53)
AST: 108 U/L — ABNORMAL HIGH (ref 0–37)
Albumin: 4.3 g/dL (ref 3.5–5.2)
Alkaline Phosphatase: 53 U/L (ref 39–117)
Bilirubin, Direct: 0.3 mg/dL (ref 0.0–0.3)
Total Bilirubin: 1.3 mg/dL — ABNORMAL HIGH (ref 0.2–1.2)
Total Protein: 6.7 g/dL (ref 6.0–8.3)

## 2022-04-07 LAB — CBC WITH DIFFERENTIAL/PLATELET
Basophils Absolute: 0 10*3/uL (ref 0.0–0.1)
Basophils Relative: 0.4 % (ref 0.0–3.0)
Eosinophils Absolute: 0.5 10*3/uL (ref 0.0–0.7)
Eosinophils Relative: 12 % — ABNORMAL HIGH (ref 0.0–5.0)
HCT: 49 % (ref 39.0–52.0)
Hemoglobin: 16.4 g/dL (ref 13.0–17.0)
Lymphocytes Relative: 27.9 % (ref 12.0–46.0)
Lymphs Abs: 1.3 10*3/uL (ref 0.7–4.0)
MCHC: 33.4 g/dL (ref 30.0–36.0)
MCV: 99.6 fl (ref 78.0–100.0)
Monocytes Absolute: 0.5 10*3/uL (ref 0.1–1.0)
Monocytes Relative: 11.5 % (ref 3.0–12.0)
Neutro Abs: 2.2 10*3/uL (ref 1.4–7.7)
Neutrophils Relative %: 48.2 % (ref 43.0–77.0)
Platelets: 138 10*3/uL — ABNORMAL LOW (ref 150.0–400.0)
RBC: 4.92 Mil/uL (ref 4.22–5.81)
RDW: 13.4 % (ref 11.5–15.5)
WBC: 4.5 10*3/uL (ref 4.0–10.5)

## 2022-04-07 LAB — LIPID PANEL
Cholesterol: 173 mg/dL (ref 0–200)
HDL: 114 mg/dL (ref >39.00)
LDL Cholesterol: 51 mg/dL (ref 0–99)
NonHDL: 59.35
Total CHOL/HDL Ratio: 2
Triglycerides: 40 mg/dL (ref 0.0–149.0)
VLDL: 8 mg/dL (ref 0.0–40.0)

## 2022-04-07 LAB — BASIC METABOLIC PANEL
BUN: 17 mg/dL (ref 6–23)
CO2: 30 mEq/L (ref 19–32)
Calcium: 9.6 mg/dL (ref 8.4–10.5)
Chloride: 101 mEq/L (ref 96–112)
Creatinine, Ser: 1.08 mg/dL (ref 0.40–1.50)
GFR: 73.75 mL/min (ref >60.00)
Glucose, Bld: 85 mg/dL (ref 70–99)
Potassium: 5.1 mEq/L (ref 3.5–5.1)
Sodium: 141 mEq/L (ref 135–145)

## 2022-04-07 LAB — PSA: PSA: 0.77 ng/mL (ref 0.10–4.00)

## 2022-04-07 LAB — TSH: TSH: 2.85 u[IU]/mL (ref 0.35–5.50)

## 2022-04-07 NOTE — Telephone Encounter (Signed)
Caller name: MANSFIELD DANN  On DPR?: Yes  Call back number: [phone] (mobile)  Provider they see: Midge Minium, MD  Reason for call:  Pt call stating that he would like all his medications to be switched to [facility], [place].

## 2022-04-07 NOTE — Telephone Encounter (Signed)
Called patient no answer LM letting him know this was in regard to lab results and he can call back or check on mychart

## 2022-04-07 NOTE — Telephone Encounter (Signed)
Pt called back to speak with Levada Dy about his lab results.

## 2022-04-08 ENCOUNTER — Other Ambulatory Visit: Payer: Self-pay

## 2022-04-08 DIAGNOSIS — Z86711 Personal history of pulmonary embolism: Secondary | ICD-10-CM

## 2022-04-08 MED ORDER — CELECOXIB 100 MG PO CAPS: 100.0000 mg | ORAL_CAPSULE | Freq: Two times a day (BID) | ORAL | 1 refills | Status: DC

## 2022-04-08 MED ORDER — COLCHICINE 0.6 MG PO TABS: 0.6000 mg | ORAL_TABLET | Freq: Every day | ORAL | 2 refills | Status: DC

## 2022-04-08 MED ORDER — RIVAROXABAN 20 MG PO TABS: 20.0000 mg | ORAL_TABLET | Freq: Every day | ORAL | 3 refills | Status: DC

## 2022-04-08 MED ORDER — SILDENAFIL CITRATE 100 MG PO TABS: ORAL_TABLET | ORAL | 3 refills | Status: DC

## 2022-04-08 NOTE — Progress Notes (Signed)
Patient called in and requested all Rx from Dr Birdie Riddle be sent to [facility] for refill as he is changing all rx to mail order

## 2022-04-08 NOTE — Telephone Encounter (Signed)
Completed request and send all Rx from Dr Birdie Riddle to [facility], called patient to inform about this being complete and he was grateful

## 2022-04-12 DIAGNOSIS — M9901 Segmental and somatic dysfunction of cervical region: Secondary | ICD-10-CM | POA: Diagnosis not present

## 2022-04-12 DIAGNOSIS — M4727 Other spondylosis with radiculopathy, lumbosacral region: Secondary | ICD-10-CM | POA: Diagnosis not present

## 2022-04-12 DIAGNOSIS — M4728 Other spondylosis with radiculopathy, sacral and sacrococcygeal region: Secondary | ICD-10-CM | POA: Diagnosis not present

## 2022-04-12 DIAGNOSIS — M4723 Other spondylosis with radiculopathy, cervicothoracic region: Secondary | ICD-10-CM | POA: Diagnosis not present

## 2022-04-19 DIAGNOSIS — M4723 Other spondylosis with radiculopathy, cervicothoracic region: Secondary | ICD-10-CM | POA: Diagnosis not present

## 2022-04-19 DIAGNOSIS — M4728 Other spondylosis with radiculopathy, sacral and sacrococcygeal region: Secondary | ICD-10-CM | POA: Diagnosis not present

## 2022-04-19 DIAGNOSIS — M4727 Other spondylosis with radiculopathy, lumbosacral region: Secondary | ICD-10-CM | POA: Diagnosis not present

## 2022-04-19 DIAGNOSIS — M9901 Segmental and somatic dysfunction of cervical region: Secondary | ICD-10-CM | POA: Diagnosis not present

## 2022-04-26 DIAGNOSIS — M4727 Other spondylosis with radiculopathy, lumbosacral region: Secondary | ICD-10-CM | POA: Diagnosis not present

## 2022-04-26 DIAGNOSIS — M9901 Segmental and somatic dysfunction of cervical region: Secondary | ICD-10-CM | POA: Diagnosis not present

## 2022-04-26 DIAGNOSIS — M4728 Other spondylosis with radiculopathy, sacral and sacrococcygeal region: Secondary | ICD-10-CM | POA: Diagnosis not present

## 2022-04-26 DIAGNOSIS — M4723 Other spondylosis with radiculopathy, cervicothoracic region: Secondary | ICD-10-CM | POA: Diagnosis not present

## 2022-05-13 ENCOUNTER — Other Ambulatory Visit: Payer: Self-pay

## 2022-05-13 MED ORDER — CELECOXIB 100 MG PO CAPS: 100.0000 mg | ORAL_CAPSULE | Freq: Two times a day (BID) | ORAL | 1 refills | Status: DC

## 2022-05-24 DIAGNOSIS — M4727 Other spondylosis with radiculopathy, lumbosacral region: Secondary | ICD-10-CM | POA: Diagnosis not present

## 2022-05-24 DIAGNOSIS — M4728 Other spondylosis with radiculopathy, sacral and sacrococcygeal region: Secondary | ICD-10-CM | POA: Diagnosis not present

## 2022-05-24 DIAGNOSIS — M4723 Other spondylosis with radiculopathy, cervicothoracic region: Secondary | ICD-10-CM | POA: Diagnosis not present

## 2022-05-24 DIAGNOSIS — M9901 Segmental and somatic dysfunction of cervical region: Secondary | ICD-10-CM | POA: Diagnosis not present

## 2022-07-01 DIAGNOSIS — Z6833 Body mass index (BMI) 33.0-33.9, adult: Secondary | ICD-10-CM | POA: Diagnosis not present

## 2022-07-01 DIAGNOSIS — I10 Essential (primary) hypertension: Secondary | ICD-10-CM | POA: Diagnosis not present

## 2022-07-01 DIAGNOSIS — J018 Other acute sinusitis: Secondary | ICD-10-CM | POA: Diagnosis not present

## 2022-07-25 ENCOUNTER — Other Ambulatory Visit: Payer: Self-pay | Admitting: Family Medicine

## 2022-09-20 ENCOUNTER — Other Ambulatory Visit: Payer: Self-pay | Admitting: Family Medicine

## 2022-10-05 ENCOUNTER — Ambulatory Visit: Payer: BC Managed Care – PPO | Admitting: Family Medicine

## 2022-10-13 ENCOUNTER — Encounter: Payer: Self-pay | Admitting: Family Medicine

## 2022-10-13 ENCOUNTER — Telehealth: Payer: Self-pay

## 2022-10-13 ENCOUNTER — Ambulatory Visit: Payer: BC Managed Care – PPO | Admitting: Family Medicine

## 2022-10-13 VITALS — BP 128/82 | HR 74 | Temp 97.9°F | Resp 18 | Ht 71.0 in | Wt 232.2 lb

## 2022-10-13 DIAGNOSIS — I10 Essential (primary) hypertension: Secondary | ICD-10-CM

## 2022-10-13 DIAGNOSIS — M109 Gout, unspecified: Secondary | ICD-10-CM | POA: Diagnosis not present

## 2022-10-13 DIAGNOSIS — D6862 Lupus anticoagulant syndrome: Secondary | ICD-10-CM | POA: Diagnosis not present

## 2022-10-13 LAB — CBC WITH DIFFERENTIAL/PLATELET
Basophils Absolute: 0 10*3/uL (ref 0.0–0.1)
Basophils Relative: 0.4 % (ref 0.0–3.0)
Eosinophils Absolute: 0.5 10*3/uL (ref 0.0–0.7)
Eosinophils Relative: 12.5 % — ABNORMAL HIGH (ref 0.0–5.0)
HCT: 47.9 % (ref 39.0–52.0)
Hemoglobin: 16.4 g/dL (ref 13.0–17.0)
Lymphocytes Relative: 27.4 % (ref 12.0–46.0)
Lymphs Abs: 1.1 10*3/uL (ref 0.7–4.0)
MCHC: 34.2 g/dL (ref 30.0–36.0)
MCV: 97.2 fl (ref 78.0–100.0)
Monocytes Absolute: 0.5 10*3/uL (ref 0.1–1.0)
Monocytes Relative: 12.2 % — ABNORMAL HIGH (ref 3.0–12.0)
Neutro Abs: 1.8 10*3/uL (ref 1.4–7.7)
Neutrophils Relative %: 47.5 % (ref 43.0–77.0)
Platelets: 146 10*3/uL — ABNORMAL LOW (ref 150.0–400.0)
RBC: 4.93 Mil/uL (ref 4.22–5.81)
RDW: 13.7 % (ref 11.5–15.5)
WBC: 3.9 10*3/uL — ABNORMAL LOW (ref 4.0–10.5)

## 2022-10-13 LAB — BASIC METABOLIC PANEL
BUN: 20 mg/dL (ref 6–23)
CO2: 31 mEq/L (ref 19–32)
Calcium: 9.5 mg/dL (ref 8.4–10.5)
Chloride: 102 mEq/L (ref 96–112)
Creatinine, Ser: 1.14 mg/dL (ref 0.40–1.50)
GFR: 68.87 mL/min (ref >60.00)
Glucose, Bld: 88 mg/dL (ref 70–99)
Potassium: 5.1 mEq/L (ref 3.5–5.1)
Sodium: 141 mEq/L (ref 135–145)

## 2022-10-13 LAB — HEPATIC FUNCTION PANEL
ALT: 118 U/L — ABNORMAL HIGH (ref 0–53)
AST: 94 U/L — ABNORMAL HIGH (ref 0–37)
Albumin: 4.1 g/dL (ref 3.5–5.2)
Alkaline Phosphatase: 54 U/L (ref 39–117)
Bilirubin, Direct: 0.3 mg/dL (ref 0.0–0.3)
Total Bilirubin: 1.1 mg/dL (ref 0.2–1.2)
Total Protein: 6.6 g/dL (ref 6.0–8.3)

## 2022-10-13 LAB — TSH: TSH: 2.83 u[IU]/mL (ref 0.35–5.50)

## 2022-10-13 LAB — LIPID PANEL
Cholesterol: 152 mg/dL (ref 0–200)
HDL: 93 mg/dL (ref >39.00)
LDL Cholesterol: 51 mg/dL (ref 0–99)
NonHDL: 58.6
Total CHOL/HDL Ratio: 2
Triglycerides: 36 mg/dL (ref 0.0–149.0)
VLDL: 7.2 mg/dL (ref 0.0–40.0)

## 2022-10-13 MED ORDER — PREDNISONE 5 MG PO TABS: 5.0000 mg | ORAL_TABLET | Freq: Every day | ORAL | 0 refills | Status: DC | PRN

## 2022-10-13 NOTE — Telephone Encounter (Signed)
-----   Message from Sheliah Hatch, MD sent at 10/13/2022  3:13 PM EDT ----- Labs look great w/ exception of elevated liver enzymes (AST/ALT).  It looks like you are due for a follow up w/ Dr Myrtie Neither.  Please call his office and schedule at your convenience

## 2022-10-13 NOTE — Patient Instructions (Signed)
Schedule your complete physical in 6 months We'll notify you of your lab results and make any changes if needed Continue to work on healthy diet and regular exercise- you can do it! Call with any questions or concerns Stay Safe!  Stay Healthy! Have a great summer!!! 

## 2022-10-13 NOTE — Progress Notes (Signed)
   Subjective:    Patient ID: Nicholas [name], male    DOB: Aug 31, 1960, 62 y.o.   MRN: [id number]  HPI HTN- chronic problem, on Losartan 25mg  daily.  Pt reports feeling 'pretty good'  No CP, SOB, HA's, visual changes, edema.  Lupus anticoagulant- pt now on lifelong anticoagulation (Xarelto 20mg  daily).  Gout- pt is asking for a prednisone prescription to have on hand since he can't take NSAIDs.  Had a flare last week in his great toe and used his remaining prednisone   Review of Systems For ROS see HPI     Objective:   Physical Exam Vitals reviewed.  Constitutional:      General: He is not in acute distress.    Appearance: Normal appearance. He is well-developed. He is obese. He is not ill-appearing.  HENT:     Head: Normocephalic and atraumatic.  Eyes:     Extraocular Movements: Extraocular movements intact.     Conjunctiva/sclera: Conjunctivae normal.     Pupils: Pupils are equal, round, and reactive to light.  Neck:     Thyroid: No thyromegaly.  Cardiovascular:     Rate and Rhythm: Normal rate and regular rhythm.     Pulses: Normal pulses.     Heart sounds: Normal heart sounds. No murmur heard. Pulmonary:     Effort: Pulmonary effort is normal. No respiratory distress.     Breath sounds: Normal breath sounds.  Abdominal:     General: Bowel sounds are normal. There is no distension.     Palpations: Abdomen is soft.  Musculoskeletal:     Cervical back: Normal range of motion and neck supple.     Right lower leg: No edema.     Left lower leg: No edema.  Lymphadenopathy:     Cervical: No cervical adenopathy.  Skin:    General: Skin is warm and dry.  Neurological:     General: No focal deficit present.     Mental Status: He is alert and oriented to person, place, and time.     Cranial Nerves: No cranial nerve deficit.  Psychiatric:        Mood and Affect: Mood normal.        Behavior: Behavior normal.           Assessment & Plan:

## 2022-10-13 NOTE — Assessment & Plan Note (Signed)
Cause of his previous PE' s.  Now on lifelong anticoagulation- currently Xarelto 20mg  daily.  Reviewed that he is not to take NSAIDs while on this medication.  Will follow.

## 2022-10-13 NOTE — Assessment & Plan Note (Signed)
Chronic problem.  Pt had flare last week and finished his remaining prednisone.  Asking for a refill since he is not able to take OTC NSAIDs for his gout.  Refill provided.

## 2022-10-13 NOTE — Assessment & Plan Note (Signed)
Chronic problem.  Currently on Losartan 25mg  daily w/ good control.  Asymptomatic.  Check labs due to ARB use but no anticipated med changes.  Will follow.

## 2022-10-13 NOTE — Telephone Encounter (Signed)
Pt aware of lab results 

## 2022-12-07 ENCOUNTER — Other Ambulatory Visit: Payer: Self-pay | Admitting: Family Medicine

## 2023-01-27 DIAGNOSIS — Z0189 Encounter for other specified special examinations: Secondary | ICD-10-CM | POA: Diagnosis not present

## 2023-01-27 DIAGNOSIS — K760 Fatty (change of) liver, not elsewhere classified: Secondary | ICD-10-CM | POA: Diagnosis not present

## 2023-01-27 DIAGNOSIS — I10 Essential (primary) hypertension: Secondary | ICD-10-CM | POA: Diagnosis not present

## 2023-01-27 DIAGNOSIS — K219 Gastro-esophageal reflux disease without esophagitis: Secondary | ICD-10-CM | POA: Diagnosis not present

## 2023-01-27 DIAGNOSIS — M109 Gout, unspecified: Secondary | ICD-10-CM | POA: Diagnosis not present

## 2023-01-27 DIAGNOSIS — F1090 Alcohol use, unspecified, uncomplicated: Secondary | ICD-10-CM | POA: Diagnosis not present

## 2023-02-02 DIAGNOSIS — L720 Epidermal cyst: Secondary | ICD-10-CM | POA: Diagnosis not present

## 2023-02-02 DIAGNOSIS — L570 Actinic keratosis: Secondary | ICD-10-CM | POA: Diagnosis not present

## 2023-02-02 DIAGNOSIS — L821 Other seborrheic keratosis: Secondary | ICD-10-CM | POA: Diagnosis not present

## 2023-02-02 DIAGNOSIS — L918 Other hypertrophic disorders of the skin: Secondary | ICD-10-CM | POA: Diagnosis not present

## 2023-02-02 DIAGNOSIS — L814 Other melanin hyperpigmentation: Secondary | ICD-10-CM | POA: Diagnosis not present

## 2023-02-13 ENCOUNTER — Other Ambulatory Visit: Payer: Self-pay | Admitting: Family Medicine

## 2023-02-13 NOTE — Telephone Encounter (Signed)
Would you like to refill this medication since pt can not take NSAIDS for his gout ?

## 2023-03-14 ENCOUNTER — Other Ambulatory Visit: Payer: Self-pay | Admitting: Family Medicine

## 2023-03-14 DIAGNOSIS — Z86711 Personal history of pulmonary embolism: Secondary | ICD-10-CM

## 2023-04-26 ENCOUNTER — Encounter: Payer: Self-pay | Admitting: Family Medicine

## 2023-04-26 ENCOUNTER — Ambulatory Visit: Payer: BC Managed Care – PPO | Admitting: Family Medicine

## 2023-04-26 VITALS — BP 132/84 | HR 69 | Temp 97.8°F | Ht 70.5 in | Wt 231.0 lb

## 2023-04-26 DIAGNOSIS — Z125 Encounter for screening for malignant neoplasm of prostate: Secondary | ICD-10-CM | POA: Diagnosis not present

## 2023-04-26 DIAGNOSIS — Z23 Encounter for immunization: Secondary | ICD-10-CM | POA: Diagnosis not present

## 2023-04-26 DIAGNOSIS — Z0000 Encounter for general adult medical examination without abnormal findings: Secondary | ICD-10-CM

## 2023-04-26 DIAGNOSIS — I10 Essential (primary) hypertension: Secondary | ICD-10-CM

## 2023-04-26 DIAGNOSIS — Z13220 Encounter for screening for lipoid disorders: Secondary | ICD-10-CM | POA: Diagnosis not present

## 2023-04-26 DIAGNOSIS — Z114 Encounter for screening for human immunodeficiency virus [HIV]: Secondary | ICD-10-CM | POA: Diagnosis not present

## 2023-04-26 LAB — CBC WITH DIFFERENTIAL/PLATELET
Basophils Absolute: 0 10*3/uL (ref 0.0–0.1)
Basophils Relative: 0.3 % (ref 0.0–3.0)
Eosinophils Absolute: 0.3 10*3/uL (ref 0.0–0.7)
Eosinophils Relative: 8.1 % — ABNORMAL HIGH (ref 0.0–5.0)
HCT: 50.4 % (ref 39.0–52.0)
Hemoglobin: 16.7 g/dL (ref 13.0–17.0)
Lymphocytes Relative: 31.3 % (ref 12.0–46.0)
Lymphs Abs: 1.3 10*3/uL (ref 0.7–4.0)
MCHC: 33.3 g/dL (ref 30.0–36.0)
MCV: 98.4 fL (ref 78.0–100.0)
Monocytes Absolute: 0.5 10*3/uL (ref 0.1–1.0)
Monocytes Relative: 13.5 % — ABNORMAL HIGH (ref 3.0–12.0)
Neutro Abs: 1.9 10*3/uL (ref 1.4–7.7)
Neutrophils Relative %: 46.8 % (ref 43.0–77.0)
Platelets: 134 10*3/uL — ABNORMAL LOW (ref 150.0–400.0)
RBC: 5.12 Mil/uL (ref 4.22–5.81)
RDW: 13.9 % (ref 11.5–15.5)
WBC: 4 10*3/uL (ref 4.0–10.5)

## 2023-04-26 LAB — LIPID PANEL
Cholesterol: 158 mg/dL (ref 0–200)
HDL: 84.9 mg/dL (ref >39.00)
LDL Cholesterol: 66 mg/dL (ref 0–99)
NonHDL: 73.24
Total CHOL/HDL Ratio: 2
Triglycerides: 35 mg/dL (ref 0.0–149.0)
VLDL: 7 mg/dL (ref 0.0–40.0)

## 2023-04-26 LAB — BASIC METABOLIC PANEL
BUN: 22 mg/dL (ref 6–23)
CO2: 31 meq/L (ref 19–32)
Calcium: 9.7 mg/dL (ref 8.4–10.5)
Chloride: 102 meq/L (ref 96–112)
Creatinine, Ser: 1.11 mg/dL (ref 0.40–1.50)
GFR: 70.84 mL/min (ref >60.00)
Glucose, Bld: 87 mg/dL (ref 70–99)
Potassium: 4.9 meq/L (ref 3.5–5.1)
Sodium: 140 meq/L (ref 135–145)

## 2023-04-26 LAB — TSH: TSH: 2.63 u[IU]/mL (ref 0.35–5.50)

## 2023-04-26 LAB — HEPATIC FUNCTION PANEL
ALT: 112 U/L — ABNORMAL HIGH (ref 0–53)
AST: 92 U/L — ABNORMAL HIGH (ref 0–37)
Albumin: 4.4 g/dL (ref 3.5–5.2)
Alkaline Phosphatase: 57 U/L (ref 39–117)
Bilirubin, Direct: 0.3 mg/dL (ref 0.0–0.3)
Total Bilirubin: 1.2 mg/dL (ref 0.2–1.2)
Total Protein: 6.5 g/dL (ref 6.0–8.3)

## 2023-04-26 LAB — PSA: PSA: 0.69 ng/mL (ref 0.10–4.00)

## 2023-04-26 MED ORDER — PREDNISONE 5 MG PO TABS: 5.0000 mg | ORAL_TABLET | Freq: Every day | ORAL | 0 refills | Status: DC | PRN

## 2023-04-26 MED ORDER — TADALAFIL 5 MG PO TABS: 5.0000 mg | ORAL_TABLET | Freq: Every day | ORAL | 3 refills | Status: DC

## 2023-04-26 NOTE — Assessment & Plan Note (Signed)
Ongoing issue.  Adequate control today.  Asymptomatic.  Check labs but no anticipated med changes.

## 2023-04-26 NOTE — Assessment & Plan Note (Signed)
Pt's PE WNL w/ exception of BMI.  UTD on colonoscopy, Tdap.  Flu shot given today.  Check labs.  Anticipatory guidance provided.

## 2023-04-26 NOTE — Patient Instructions (Signed)
Follow up in 6 months to recheck blood pressure We'll notify you of your lab results and make any changes if needed Continue to work on healthy diet and regular exercise- you can do it! If you need a referral for your back- let us know! Call with any questions or concerns Stay Safe!  Stay Healthy! Happy Holidays!!

## 2023-04-26 NOTE — Progress Notes (Signed)
   Subjective:    Patient ID: Nicholas [name], male    DOB: [birth year], 62 y.o.   MRN: [id number]  HPI CPE- UTD on Tdap, colonoscopy.  Will get flu today.  Patient Care Team    Relationship Specialty Notifications Start End  Sheliah Hatch, MD PCP - General Family Medicine  04/30/19   Sherrilyn Rist, MD Consulting Physician Gastroenterology  04/30/19   Malachy Mood, MD Consulting Physician Hematology  04/30/19     Health Maintenance  Topic Date Due   HIV Screening  Never done   Zoster Vaccines- Shingrix (1 of 2) Never done   INFLUENZA VACCINE  01/05/2023   COVID-19 Vaccine (4 - 2023-24 season) 02/05/2023   DTaP/Tdap/Td (2 - Td or Tdap) 04/30/2023   Colonoscopy  08/27/2023   Hepatitis C Screening  Completed   HPV VACCINES  Aged Out      Review of Systems Patient reports no vision/hearing changes, anorexia, fever ,adenopathy, persistant/recurrent hoarseness, swallowing issues, chest pain, palpitations, edema, persistant/recurrent cough, hemoptysis, dyspnea (rest,exertional, paroxysmal nocturnal), gastrointestinal  bleeding (melena, rectal bleeding), abdominal pain, excessive heart burn, GU symptoms (dysuria, hematuria, voiding/incontinence issues) syncope, focal weakness, memory loss, numbness & tingling, skin/hair/nail changes, depression, anxiety, abnormal bruising/bleeding, musculoskeletal symptoms/signs.     Objective:   Physical Exam General Appearance:    Alert, cooperative, no distress, appears stated age, obese  Head:    Normocephalic, without obvious abnormality, atraumatic  Eyes:    PERRL, conjunctiva/corneas clear, EOM's intact both eyes       Ears:    Normal TM's and external ear canals, both ears  Nose:   Nares normal, septum midline, mucosa normal, no drainage   or sinus tenderness  Throat:   Lips, mucosa, and tongue normal; teeth and gums normal  Neck:   Supple, symmetrical, trachea midline, no adenopathy;       thyroid:  No enlargement/tenderness/nodules  Back:      Symmetric, no curvature, ROM normal, no CVA tenderness  Lungs:     Clear to auscultation bilaterally, respirations unlabored  Chest wall:    No tenderness or deformity  Heart:    Regular rate and rhythm, S1 and S2 normal, no murmur, rub   or gallop  Abdomen:     Soft, non-tender, bowel sounds active all four quadrants,    no masses, no organomegaly  Genitalia:    deferred  Rectal:    Extremities:   Extremities normal, atraumatic, no cyanosis or edema  Pulses:   2+ and symmetric all extremities  Skin:   Skin color, texture, turgor normal, no rashes or lesions  Lymph nodes:   Cervical, supraclavicular, and axillary nodes normal  Neurologic:   CNII-XII intact. Normal strength, sensation and reflexes      throughout          Assessment & Plan:

## 2023-04-27 ENCOUNTER — Telehealth: Payer: Self-pay

## 2023-04-27 LAB — HIV ANTIBODY (ROUTINE TESTING W REFLEX): HIV 1&2 Ab, 4th Generation: NONREACTIVE

## 2023-04-27 NOTE — Telephone Encounter (Signed)
-----   Message from Neena Rhymes sent at 04/27/2023  7:34 AM EST ----- Labs are stable- including liver enzymes.  This is good news.  No changes at this time

## 2023-04-27 NOTE — Telephone Encounter (Signed)
Pt has reviewed Via MyChart

## 2023-05-17 DIAGNOSIS — J0140 Acute pansinusitis, unspecified: Secondary | ICD-10-CM | POA: Diagnosis not present

## 2023-05-17 DIAGNOSIS — R0989 Other specified symptoms and signs involving the circulatory and respiratory systems: Secondary | ICD-10-CM | POA: Diagnosis not present

## 2023-05-17 DIAGNOSIS — J988 Other specified respiratory disorders: Secondary | ICD-10-CM | POA: Diagnosis not present

## 2023-05-17 DIAGNOSIS — R052 Subacute cough: Secondary | ICD-10-CM | POA: Diagnosis not present

## 2023-08-03 ENCOUNTER — Other Ambulatory Visit: Payer: Self-pay | Admitting: Family Medicine

## 2023-08-03 ENCOUNTER — Encounter: Payer: Self-pay | Admitting: Gastroenterology

## 2023-08-31 ENCOUNTER — Other Ambulatory Visit: Payer: Self-pay | Admitting: Family Medicine

## 2023-10-18 ENCOUNTER — Telehealth: Payer: Self-pay | Admitting: Family Medicine

## 2023-10-18 MED ORDER — PREDNISONE 5 MG PO TABS: 5.0000 mg | ORAL_TABLET | Freq: Every day | ORAL | 0 refills | Status: DC | PRN

## 2023-10-18 NOTE — Telephone Encounter (Signed)
 Copied from CRM [id number]. Topic: Clinical - Medication Refill >> Oct 18, 2023  8:21 AM Nicholas [name] wrote: Medication: predniSONE  (DELTASONE ) 5 MG tablet  Has the patient contacted their pharmacy? Yes (Agent: If no, request that the patient contact the pharmacy for the refill. If patient does not wish to contact the pharmacy document the reason why and proceed with request.) (Agent: If yes, when and what did the pharmacy advise?)   [facility] [address], [place] - [address] [place] [address] Phone: [phone] Fax: [phone]  Is this the correct pharmacy for this prescription? Yes If no, delete pharmacy and type the correct one.   Has the prescription been filled recently? Yes  Is the patient out of the medication? Yes  Has the patient been seen for an appointment in the last year OR does the patient have an upcoming appointment? Yes  Can we respond through MyChart? Yes  Agent: Please be advised that Rx refills may take up to 3 business days. We ask that you follow-up with your pharmacy.

## 2023-10-18 NOTE — Telephone Encounter (Signed)
 Requested Prescriptions   Pending Prescriptions Disp Refills   predniSONE  (DELTASONE ) 5 MG tablet 30 tablet 0    Sig: Take 1 tablet (5 mg total) by mouth daily as needed.     Date of patient request: 10/18/2023 Last office visit: 04/26/2023 Upcoming visit: 11/08/2023 Date of last refill: 04/26/2023 Last refill amount: 30

## 2023-10-25 ENCOUNTER — Ambulatory Visit: Payer: BC Managed Care – PPO | Admitting: Family Medicine

## 2023-11-08 ENCOUNTER — Ambulatory Visit: Admitting: Family Medicine

## 2023-11-08 ENCOUNTER — Ambulatory Visit: Payer: Self-pay | Admitting: Family Medicine

## 2023-11-08 ENCOUNTER — Encounter: Payer: Self-pay | Admitting: Family Medicine

## 2023-11-08 VITALS — BP 112/70 | HR 74 | Temp 98.0°F | Ht 70.5 in | Wt 228.2 lb

## 2023-11-08 DIAGNOSIS — J329 Chronic sinusitis, unspecified: Secondary | ICD-10-CM

## 2023-11-08 DIAGNOSIS — I10 Essential (primary) hypertension: Secondary | ICD-10-CM | POA: Diagnosis not present

## 2023-11-08 DIAGNOSIS — M25512 Pain in left shoulder: Secondary | ICD-10-CM

## 2023-11-08 DIAGNOSIS — B9689 Other specified bacterial agents as the cause of diseases classified elsewhere: Secondary | ICD-10-CM

## 2023-11-08 LAB — BASIC METABOLIC PANEL WITH GFR
BUN: 24 mg/dL — ABNORMAL HIGH (ref 6–23)
CO2: 29 meq/L (ref 19–32)
Calcium: 9.7 mg/dL (ref 8.4–10.5)
Chloride: 103 meq/L (ref 96–112)
Creatinine, Ser: 1.14 mg/dL (ref 0.40–1.50)
GFR: 68.35 mL/min (ref >60.00)
Glucose, Bld: 86 mg/dL (ref 70–99)
Potassium: 4.4 meq/L (ref 3.5–5.1)
Sodium: 139 meq/L (ref 135–145)

## 2023-11-08 LAB — CBC WITH DIFFERENTIAL/PLATELET
Basophils Absolute: 0 10*3/uL (ref 0.0–0.1)
Basophils Relative: 0.4 % (ref 0.0–3.0)
Eosinophils Absolute: 0.4 10*3/uL (ref 0.0–0.7)
Eosinophils Relative: 5.9 % — ABNORMAL HIGH (ref 0.0–5.0)
HCT: 48.1 % (ref 39.0–52.0)
Hemoglobin: 16 g/dL (ref 13.0–17.0)
Lymphocytes Relative: 15.9 % (ref 12.0–46.0)
Lymphs Abs: 1 10*3/uL (ref 0.7–4.0)
MCHC: 33.3 g/dL (ref 30.0–36.0)
MCV: 95.6 fl (ref 78.0–100.0)
Monocytes Absolute: 0.8 10*3/uL (ref 0.1–1.0)
Monocytes Relative: 12.7 % — ABNORMAL HIGH (ref 3.0–12.0)
Neutro Abs: 3.9 10*3/uL (ref 1.4–7.7)
Neutrophils Relative %: 65.1 % (ref 43.0–77.0)
Platelets: 132 10*3/uL — ABNORMAL LOW (ref 150.0–400.0)
RBC: 5.03 Mil/uL (ref 4.22–5.81)
RDW: 13.9 % (ref 11.5–15.5)
WBC: 6 10*3/uL (ref 4.0–10.5)

## 2023-11-08 LAB — LIPID PANEL
Cholesterol: 143 mg/dL (ref 0–200)
HDL: 78.2 mg/dL (ref >39.00)
LDL Cholesterol: 57 mg/dL (ref 0–99)
NonHDL: 64.51
Total CHOL/HDL Ratio: 2
Triglycerides: 38 mg/dL (ref 0.0–149.0)
VLDL: 7.6 mg/dL (ref 0.0–40.0)

## 2023-11-08 LAB — HEPATIC FUNCTION PANEL
ALT: 105 U/L — ABNORMAL HIGH (ref 0–53)
AST: 73 U/L — ABNORMAL HIGH (ref 0–37)
Albumin: 4.3 g/dL (ref 3.5–5.2)
Alkaline Phosphatase: 50 U/L (ref 39–117)
Bilirubin, Direct: 0.3 mg/dL (ref 0.0–0.3)
Total Bilirubin: 1.3 mg/dL — ABNORMAL HIGH (ref 0.2–1.2)
Total Protein: 7 g/dL (ref 6.0–8.3)

## 2023-11-08 LAB — TSH: TSH: 2.1 u[IU]/mL (ref 0.35–5.50)

## 2023-11-08 MED ORDER — AMOXICILLIN 875 MG PO TABS: 875.0000 mg | ORAL_TABLET | Freq: Two times a day (BID) | ORAL | 0 refills | Status: AC

## 2023-11-08 NOTE — Patient Instructions (Signed)
 Schedule your complete physical in 6 months We'll notify you of your lab results and make any changes if needed START the Amoxicillin twice daily for the sinus infection IF the shoulder pain or motion doesn't improve in the next few weeks, please let me know so we can refer Call with any questions or concerns Hang in there!

## 2023-11-08 NOTE — Assessment & Plan Note (Signed)
 Chronic problem.  Currently well controlled on Losartan  25mg  daily.  Asymptomatic.  Check labs due to ARB use but no anticipated med changes.

## 2023-11-08 NOTE — Progress Notes (Signed)
   Subjective:    Patient ID: NASIER THUMM, male    DOB: Sep 11, 1960, 63 y.o.   MRN: [id number]  HPI HTN- chronic problem, on Losartan  25mg  daily w/ good control.  No CP, SOB, visual changes, edema.  URI- 'i'm pretty sure I've got a sinus infxn'.  Sxs started ~1 week ago.  + frontal and maxillary sinus pain.  R ear pain- particularly when swallowing.  + HA.  + mild cough.  No fever.  L shoulder pain- pt reports sxs started in March.  Unable to lift arm in forward flexion or abduction.  Pain is severe at night.  No weakness of grip.  Pt feels that work is the problem and feels that things are slowly improving- 'it's the best it's been since March'.   Review of Systems For ROS see HPI     Objective:   Physical Exam Vitals reviewed.  Constitutional:      General: He is not in acute distress.    Appearance: Normal appearance. He is well-developed. He is not ill-appearing.  HENT:     Head: Normocephalic and atraumatic.     Right Ear: Tympanic membrane normal.     Left Ear: Tympanic membrane normal.     Nose: Mucosal edema and congestion present. No rhinorrhea.     Right Sinus: Maxillary sinus tenderness and frontal sinus tenderness present.     Left Sinus: Maxillary sinus tenderness and frontal sinus tenderness present.     Mouth/Throat:     Pharynx: No oropharyngeal exudate or posterior oropharyngeal erythema.  Eyes:     Conjunctiva/sclera: Conjunctivae normal.     Pupils: Pupils are equal, round, and reactive to light.  Cardiovascular:     Rate and Rhythm: Normal rate and regular rhythm.     Heart sounds: Normal heart sounds.  Pulmonary:     Effort: Pulmonary effort is normal. No respiratory distress.     Breath sounds: Normal breath sounds. No wheezing.  Musculoskeletal:     Cervical back: Normal range of motion and neck supple.     Comments: Inability to flex or abduct L shoulder >30 degrees  Lymphadenopathy:     Cervical: No cervical adenopathy.  Skin:    General: Skin is  warm and dry.  Neurological:     Mental Status: He is alert and oriented to person, place, and time.  Psychiatric:        Mood and Affect: Mood normal.        Behavior: Behavior normal.        Thought Content: Thought content normal.           Assessment & Plan:  Bacterial sinusitis- new.  Pt's sxs and PE consistent w/ infxn.  Start abx.  Reviewed supportive care and red flags that should prompt return.  Pt expressed understanding and is in agreement w/ plan.   L shoulder pain- new.  Pt reports he has not had full ROM since March.  Feels that things are improving and rather than see a specialist, he wants to give this more time.  Encouraged him to reconsider and have a complete evaluation, he declines.

## 2023-11-28 ENCOUNTER — Other Ambulatory Visit: Payer: Self-pay | Admitting: Family Medicine

## 2023-12-17 ENCOUNTER — Other Ambulatory Visit: Payer: Self-pay | Admitting: Family Medicine

## 2023-12-17 DIAGNOSIS — Z86711 Personal history of pulmonary embolism: Secondary | ICD-10-CM

## 2024-01-26 DIAGNOSIS — Z23 Encounter for immunization: Secondary | ICD-10-CM | POA: Diagnosis not present

## 2024-01-26 DIAGNOSIS — M109 Gout, unspecified: Secondary | ICD-10-CM | POA: Diagnosis not present

## 2024-01-26 DIAGNOSIS — I10 Essential (primary) hypertension: Secondary | ICD-10-CM | POA: Diagnosis not present

## 2024-01-26 DIAGNOSIS — K219 Gastro-esophageal reflux disease without esophagitis: Secondary | ICD-10-CM | POA: Diagnosis not present

## 2024-01-26 DIAGNOSIS — K760 Fatty (change of) liver, not elsewhere classified: Secondary | ICD-10-CM | POA: Diagnosis not present

## 2024-02-14 ENCOUNTER — Other Ambulatory Visit: Payer: Self-pay | Admitting: Family Medicine

## 2024-05-10 ENCOUNTER — Ambulatory Visit: Admitting: Orthopedic Surgery

## 2024-05-10 ENCOUNTER — Other Ambulatory Visit: Payer: Self-pay

## 2024-05-10 ENCOUNTER — Encounter: Payer: Self-pay | Admitting: Orthopedic Surgery

## 2024-05-10 VITALS — BP 144/81 | HR 84 | Ht 70.5 in | Wt 239.0 lb

## 2024-05-10 DIAGNOSIS — M79641 Pain in right hand: Secondary | ICD-10-CM | POA: Diagnosis not present

## 2024-05-10 DIAGNOSIS — M65331 Trigger finger, right middle finger: Secondary | ICD-10-CM

## 2024-05-10 DIAGNOSIS — M25441 Effusion, right hand: Secondary | ICD-10-CM | POA: Diagnosis not present

## 2024-05-10 NOTE — Progress Notes (Signed)
 New Patient Visit  Summary: Nicholas [name] is a 63 y.o. male with the following: 1. Trigger finger, right middle finger 2. Swelling of finger joint of right hand  Assessment and Plan Assessment & Plan Trigger finger, right hand Chronic pain and swelling in the right long finger likely due to tendon and pulley irritation, possibly trauma-related.  Consistent with a trigger finger.  Injection may offer relief and diagnostic insight. Surgery considered if symptoms persist or recur. - Administered steroid injection to reduce inflammation and improve tendon gliding. - Consider nighttime splint or buddy taping to help reduce tendon irritation. - If symptoms persist or recur after injection, consider MRI or referral to hand specialist. - Discuss potential surgical intervention if conservative measures fail.  Right wrist pain Chronic swelling and pain possibly related to osteoarthritis or other causes, and may be exacerbated by repetitive use. - Recommended over-the-counter analgesics such as acetaminophen  or ibuprofen for pain management. - Suggested wrist brace to provide support and reduce strain.  Procedure note injection - Right Long finger A1 Pulley  Verbal consent was obtained to inject the Right Long finger A1 pulley Timeout was completed to confirm the site of injection.  The skin was prepped with alcohol and ethyl chloride was sprayed at the injection site.  A 21-gauge needle was used to inject 40 mg of Depo-Medrol  and 1% lidocaine  (1 cc) into the Right Long finger using a direct anterior approach.  There were no complications. Patient tolerated the procedure well. A sterile bandage was applied    Follow-up: Return if symptoms worsen or fail to improve.  Subjective:  Chief Complaint  Patient presents with   Hand Pain    R middle finger since beginning of the yr. Pt states he picked up an engine block and felt a pop, since he's been having pain and swelling. Also states finger  draws up and he has to pull it open in the mornings.      Discussed the use of AI scribe software for clinical note transcription with the patient, who gave verbal consent to proceed.  History of Present Illness Nicholas [name] is a 63 year old male who presents with finger pain and swelling.  Around the beginning of the year, he tried to lift an engine block and felt a sensation like a big rubber band popped in his finger. Since then he has had persistent pain and swelling in that finger on his dominant right hand. Over the last several weeks, at night the finger draws and he has pain mainly at the PIP joint, sometimes extending along the finger. He has difficulty making a full fist. He has not noticed bruising, and the swelling has never resolved.  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  He has carpal tunnel syndrome and reports increasing wrist swelling and pain that he thinks may be arthritis. The wrist swelling is worsening.    Review of Systems: No fevers or chills No numbness or tingling No chest pain No shortness of breath No bowel or bladder dysfunction No GI distress No headaches   Medical History:  Past Medical History:  Diagnosis Date   Arthritis    my whole body   Chronic back pain    mainly lower; some in top too (03/26/2014)   DVT (deep venous thrombosis) (HCC)    GERD (gastroesophageal reflux disease)    History of gout    Hypertension    Pulmonary embolism (  HCC)    Recurrent & unprovoked August 2016. Provoked/Post-op December 2015    Past Surgical History:  Procedure Laterality Date   BACK SURGERY     CARPAL TUNNEL RELEASE Bilateral 2012   COLONOSCOPY     DECOMPRESSIVE LUMBAR LAMINECTOMY LEVEL 3 N/A 03/26/2014   Procedure: L3-S1 DECOMPRESSION;  Surgeon: Donaciano Sprang, MD;  Location: [facility] OR;  Service: Orthopedics;  Laterality: N/A;   INGUINAL HERNIA REPAIR Right 2000's   LUMBAR  LAMINECTOMY/DECOMPRESSION MICRODISCECTOMY  03/26/2014   L3-S1   lytic therapy  August 2016   For Lower Extremity DVT    Family History  Problem Relation Age of Onset   Colon cancer Mother    Colon cancer Paternal Grandmother        colon cancer   Lung disease Neg Hx    Esophageal cancer Neg Hx    Stomach cancer Neg Hx    Pancreatic cancer Neg Hx    Social History   Tobacco Use   Smoking status: Former    Current packs/day: 0.00    Average packs/day: 1.5 packs/day for 24.0 years (36.0 ttl pk-yrs)    Types: Cigarettes    Start date: 06/06/1964    Quit date: 06/06/1988    Years since quitting: 35.9   Smokeless tobacco: Former    Types: Snuff, Chew   Tobacco comments:    Quit chewing tobacco around 2000  Vaping Use   Vaping status: Never Used  Substance Use Topics   Alcohol use: Yes    Alcohol/week: 7.0 standard drinks of alcohol    Types: 7 Cans of beer per week    Comment: 7 beers per week   Drug use: No    No Known Allergies  Current Meds  Medication Sig   allopurinol (ZYLOPRIM) 100 MG tablet Take 100 mg by mouth daily.   celecoxib  (CELEBREX ) 100 MG capsule TAKE 1 CAPSULE BY MOUTH TWICE  DAILY   colchicine  0.6 MG tablet TAKE 1 TABLET BY MOUTH DAILY   losartan  (COZAAR ) 25 MG tablet Take 25 mg by mouth daily.   omeprazole (PRILOSEC) 20 MG capsule Take 20 mg by mouth daily.   tadalafil  (CIALIS ) 5 MG tablet TAKE 1 TABLET BY MOUTH DAILY   tamsulosin (FLOMAX) 0.4 MG CAPS capsule Take 0.8 mg by mouth daily.   XARELTO  20 MG TABS tablet TAKE 1 TABLET BY MOUTH DAILY    Objective: BP (!) 144/81   Pulse 84   Ht 5' 10.5 (1.791 m)   Wt 239 lb (108.4 kg)   BMI 33.81 kg/m   Physical Exam:    General: Alert and oriented. and No acute distress. Gait: Normal gait.  Physical Exam MUSCULOSKELETAL: No cyst in bilateral wrist.  Mild swelling, but this is consistent bilaterally.  Slightly restricted range of motion of the right wrist.  Evaluation of the right long finger  demonstrates no deformity.  No bruising.  Mild tenderness over the A1 pulley to the long finger.  There is some swelling around the PIP joint.  He he does have some stiffness.  No active triggering noted in clinic today.  Fingers are warm and well-perfused.   IMAGING: I personally ordered and reviewed the following images   X-rays of the right hand were obtained in clinic today.  No acute injuries are noted.  Minimal degenerative changes noted at the MCP joint to the long finger.  No deformity.  There is loss of joint space within the wrist, with some subchondral cysts within the ulna.  Impression: Right hand x-ray, negative for acute injury with mild degenerative changes of the wrist     New Medications:  No orders of the defined types were placed in this encounter.     Portions of this note were completed via Scientist, clinical (histocompatibility and immunogenetics).  Nicholas DELENA Horde, MD  05/10/2024 9:44 AM

## 2024-05-10 NOTE — Patient Instructions (Signed)

## 2024-05-20 ENCOUNTER — Ambulatory Visit: Admitting: Family Medicine

## 2024-05-20 ENCOUNTER — Encounter: Payer: Self-pay | Admitting: Family Medicine

## 2024-05-20 VITALS — BP 128/70 | HR 78 | Temp 98.1°F | Ht 70.5 in | Wt 234.0 lb

## 2024-05-20 DIAGNOSIS — E6609 Other obesity due to excess calories: Secondary | ICD-10-CM | POA: Diagnosis not present

## 2024-05-20 DIAGNOSIS — Z6833 Body mass index (BMI) 33.0-33.9, adult: Secondary | ICD-10-CM

## 2024-05-20 DIAGNOSIS — Z0000 Encounter for general adult medical examination without abnormal findings: Secondary | ICD-10-CM | POA: Diagnosis not present

## 2024-05-20 DIAGNOSIS — Z1211 Encounter for screening for malignant neoplasm of colon: Secondary | ICD-10-CM | POA: Diagnosis not present

## 2024-05-20 DIAGNOSIS — E66811 Obesity, class 1: Secondary | ICD-10-CM | POA: Diagnosis not present

## 2024-05-20 DIAGNOSIS — Z125 Encounter for screening for malignant neoplasm of prostate: Secondary | ICD-10-CM

## 2024-05-20 LAB — CBC WITH DIFFERENTIAL/PLATELET
Basophils Absolute: 0 K/uL (ref 0.0–0.1)
Basophils Relative: 0.2 % (ref 0.0–3.0)
Eosinophils Absolute: 0.4 K/uL (ref 0.0–0.7)
Eosinophils Relative: 8.2 % — ABNORMAL HIGH (ref 0.0–5.0)
HCT: 50.4 % (ref 39.0–52.0)
Hemoglobin: 17 g/dL (ref 13.0–17.0)
Lymphocytes Relative: 37.4 % (ref 12.0–46.0)
Lymphs Abs: 1.9 K/uL (ref 0.7–4.0)
MCHC: 33.6 g/dL (ref 30.0–36.0)
MCV: 94.1 fl (ref 78.0–100.0)
Monocytes Absolute: 0.6 K/uL (ref 0.1–1.0)
Monocytes Relative: 11.9 % (ref 3.0–12.0)
Neutro Abs: 2.2 K/uL (ref 1.4–7.7)
Neutrophils Relative %: 42.3 % — ABNORMAL LOW (ref 43.0–77.0)
Platelets: 140 K/uL — ABNORMAL LOW (ref 150.0–400.0)
RBC: 5.35 Mil/uL (ref 4.22–5.81)
RDW: 14.7 % (ref 11.5–15.5)
WBC: 5.2 K/uL (ref 4.0–10.5)

## 2024-05-20 LAB — LIPID PANEL
Cholesterol: 160 mg/dL (ref 0–200)
HDL: 84.1 mg/dL (ref >39.00)
LDL Cholesterol: 66 mg/dL (ref 0–99)
NonHDL: 75.83
Total CHOL/HDL Ratio: 2
Triglycerides: 51 mg/dL (ref 0.0–149.0)
VLDL: 10.2 mg/dL (ref 0.0–40.0)

## 2024-05-20 LAB — HEPATIC FUNCTION PANEL
ALT: 82 U/L — ABNORMAL HIGH (ref 0–53)
AST: 58 U/L — ABNORMAL HIGH (ref 0–37)
Albumin: 4.4 g/dL (ref 3.5–5.2)
Alkaline Phosphatase: 59 U/L (ref 39–117)
Bilirubin, Direct: 0.2 mg/dL (ref 0.0–0.3)
Total Bilirubin: 1 mg/dL (ref 0.2–1.2)
Total Protein: 6.7 g/dL (ref 6.0–8.3)

## 2024-05-20 LAB — BASIC METABOLIC PANEL WITH GFR
BUN: 26 mg/dL — ABNORMAL HIGH (ref 6–23)
CO2: 28 meq/L (ref 19–32)
Calcium: 9.3 mg/dL (ref 8.4–10.5)
Chloride: 102 meq/L (ref 96–112)
Creatinine, Ser: 1.19 mg/dL (ref 0.40–1.50)
GFR: 64.68 mL/min (ref >60.00)
Glucose, Bld: 90 mg/dL (ref 70–99)
Potassium: 4 meq/L (ref 3.5–5.1)
Sodium: 139 meq/L (ref 135–145)

## 2024-05-20 LAB — PSA: PSA: 0.66 ng/mL (ref 0.10–4.00)

## 2024-05-20 LAB — TSH: TSH: 4.75 u[IU]/mL (ref 0.35–5.50)

## 2024-05-20 MED ORDER — PREDNISONE 5 MG PO TABS: 5.0000 mg | ORAL_TABLET | Freq: Every day | ORAL | 0 refills | Status: AC | PRN

## 2024-05-20 NOTE — Progress Notes (Signed)
° °  Subjective:    Patient ID: Nicholas [name], male    DOB: [birth year], 63 y.o.   MRN: [id number]  HPI CPE- due for colonoscopy.  UTD on Tdap.  Health Maintenance  Topic Date Due   Pneumococcal Vaccine: 50+ Years (1 of 1 - PCV) Never done   Zoster Vaccines- Shingrix (1 of 2) Never done   Colonoscopy  08/27/2023   Influenza Vaccine  01/05/2024   COVID-19 Vaccine (5 - 2025-26 season) 02/05/2024   DTaP/Tdap/Td (3 - Td or Tdap) 01/25/2034   Hepatitis C Screening  Completed   HIV Screening  Completed   Hepatitis B Vaccines 19-59 Average Risk  Aged Out   HPV VACCINES  Aged Out   Meningococcal B Vaccine  Aged Out    Patient Care Team    Relationship Specialty Notifications Start End  Mahlon Comer BRAVO, MD PCP - General Family Medicine  04/30/19   Legrand Victory LITTIE DOUGLAS, MD Consulting Physician Gastroenterology  04/30/19   Lanny Callander, MD Consulting Physician Hematology  04/30/19       Review of Systems Patient reports no vision/hearing changes, anorexia, fever ,adenopathy, persistant/recurrent hoarseness, swallowing issues, chest pain, palpitations, edema, persistant/recurrent cough, hemoptysis, dyspnea (rest,exertional, paroxysmal nocturnal), gastrointestinal  bleeding (melena, rectal bleeding), abdominal pain, excessive heart burn, GU symptoms (dysuria, hematuria, voiding/incontinence issues) syncope, focal weakness, memory loss, numbness & tingling, skin/hair/nail changes, depression, anxiety, abnormal bruising/bleeding, musculoskeletal symptoms/signs.   + 5 lb weight loss    Objective:   Physical Exam General Appearance:    Alert, cooperative, no distress, appears stated age  Head:    Normocephalic, without obvious abnormality, atraumatic  Eyes:    PERRL, conjunctiva/corneas clear, EOM's intact both eyes       Ears:    Normal TM's and external ear canals, both ears  Nose:   Nares normal, septum midline, mucosa normal, no drainage   or sinus tenderness  Throat:   Lips, mucosa, and  tongue normal; teeth and gums normal  Neck:   Supple, symmetrical, trachea midline, no adenopathy;       thyroid :  No enlargement/tenderness/nodules  Back:     Symmetric, no curvature, ROM normal, no CVA tenderness  Lungs:     Clear to auscultation bilaterally, respirations unlabored  Chest wall:    No tenderness or deformity  Heart:    Regular rate and rhythm, S1 and S2 normal, no murmur, rub   or gallop  Abdomen:     Soft, non-tender, bowel sounds active all four quadrants,    no masses, no organomegaly  Genitalia:    deferred  Rectal:    Extremities:   Extremities normal, atraumatic, no cyanosis or edema  Pulses:   2+ and symmetric all extremities  Skin:   Skin color, texture, turgor normal, no rashes or lesions  Lymph nodes:   Cervical, supraclavicular, and axillary nodes normal  Neurologic:   CNII-XII intact. Normal strength, sensation and reflexes      throughout          Assessment & Plan:

## 2024-05-20 NOTE — Patient Instructions (Signed)
Follow up in 1 year or as needed  We'll notify you of your lab results and make any changes if needed Keep up the good work on healthy diet and regular exercise- you're doing great! Call with any questions or concerns Stay Safe!  Stay Healthy! Happy Holidays!!!

## 2024-05-20 NOTE — Assessment & Plan Note (Signed)
 Pt's PE WNL w/ exception of BMI.  UTD on Tdap.  Declines flu and PNA.  Referral made for colonoscopy.  Check labs.  Anticipatory guidance provided.

## 2024-05-21 ENCOUNTER — Ambulatory Visit: Payer: Self-pay | Admitting: Family Medicine

## 2024-05-21 NOTE — Telephone Encounter (Unsigned)
 Copied from CRM #[id number]. Topic: Clinical - Lab/Test Results >> May 21, 2024  3:37 PM Aisha D wrote: Reason for CRM: Pt is returning a missed call from [facility] in regards to his lab results. Pt has been made aware of the results.

## 2024-06-22 ENCOUNTER — Other Ambulatory Visit: Payer: Self-pay | Admitting: Family Medicine

## 2025-05-21 ENCOUNTER — Encounter: Admitting: Family Medicine
